# Patient Record
Sex: Female | Born: 1945
Health system: Southern US, Community
[De-identification: ages and names within clinical notes are randomized; demographics above are authoritative.]

## PROBLEM LIST (undated history)

## (undated) DIAGNOSIS — E785 Hyperlipidemia, unspecified: Secondary | ICD-10-CM

## (undated) DIAGNOSIS — Z9289 Personal history of other medical treatment: Secondary | ICD-10-CM

## (undated) DIAGNOSIS — E118 Type 2 diabetes mellitus with unspecified complications: Secondary | ICD-10-CM

## (undated) DIAGNOSIS — I739 Peripheral vascular disease, unspecified: Secondary | ICD-10-CM

## (undated) DIAGNOSIS — I10 Essential (primary) hypertension: Secondary | ICD-10-CM

## (undated) DIAGNOSIS — I2510 Atherosclerotic heart disease of native coronary artery without angina pectoris: Secondary | ICD-10-CM

## (undated) DIAGNOSIS — I779 Disorder of arteries and arterioles, unspecified: Secondary | ICD-10-CM

## (undated) DIAGNOSIS — E119 Type 2 diabetes mellitus without complications: Secondary | ICD-10-CM

## (undated) DIAGNOSIS — I771 Stricture of artery: Secondary | ICD-10-CM

## (undated) DIAGNOSIS — I774 Celiac artery compression syndrome: Secondary | ICD-10-CM

## (undated) HISTORY — DX: Celiac artery compression syndrome: I77.4

## (undated) HISTORY — DX: Essential (primary) hypertension: I10

## (undated) HISTORY — DX: Peripheral vascular disease, unspecified: I73.9

## (undated) HISTORY — PX: CORONARY ANGIOPLASTY: SHX604

## (undated) HISTORY — DX: Personal history of other medical treatment: Z92.89

## (undated) HISTORY — DX: Disorder of arteries and arterioles, unspecified: I77.9

## (undated) HISTORY — DX: Stricture of artery: I77.1

## (undated) HISTORY — DX: Atherosclerotic heart disease of native coronary artery without angina pectoris: I25.10

## (undated) HISTORY — PX: EYE SURGERY: SHX253

## (undated) HISTORY — DX: Hyperlipidemia, unspecified: E78.5

## (undated) HISTORY — PX: ANKLE SURGERY: SHX546

---

## 2004-05-14 ENCOUNTER — Ambulatory Visit: Payer: Self-pay | Admitting: Family Medicine

## 2005-05-19 ENCOUNTER — Ambulatory Visit: Payer: Self-pay | Admitting: Family Medicine

## 2005-08-24 ENCOUNTER — Ambulatory Visit: Payer: Self-pay | Admitting: Family Medicine

## 2005-10-23 ENCOUNTER — Emergency Department: Payer: Self-pay | Admitting: Internal Medicine

## 2006-01-03 ENCOUNTER — Emergency Department: Payer: Self-pay | Admitting: Emergency Medicine

## 2006-07-15 ENCOUNTER — Ambulatory Visit: Payer: Self-pay | Admitting: Family Medicine

## 2006-08-18 ENCOUNTER — Ambulatory Visit: Payer: Self-pay | Admitting: Specialist

## 2006-08-18 ENCOUNTER — Other Ambulatory Visit: Payer: Self-pay

## 2006-08-25 ENCOUNTER — Ambulatory Visit: Payer: Self-pay | Admitting: Specialist

## 2006-12-20 ENCOUNTER — Ambulatory Visit: Payer: Self-pay | Admitting: Physician Assistant

## 2008-02-01 ENCOUNTER — Ambulatory Visit: Payer: Self-pay | Admitting: Family Medicine

## 2012-02-09 ENCOUNTER — Ambulatory Visit: Payer: Self-pay | Admitting: Family Medicine

## 2012-02-23 ENCOUNTER — Ambulatory Visit: Payer: Self-pay | Admitting: Family Medicine

## 2015-09-30 ENCOUNTER — Inpatient Hospital Stay (HOSPITAL_COMMUNITY): Payer: Commercial Managed Care - HMO

## 2015-09-30 ENCOUNTER — Emergency Department
Admission: EM | Admit: 2015-09-30 | Discharge: 2015-09-30 | Disposition: A | Discharge status: Other Acute Inpatient Hospital | Payer: Commercial Managed Care - HMO | Attending: Emergency Medicine | Admitting: Emergency Medicine

## 2015-09-30 ENCOUNTER — Inpatient Hospital Stay (HOSPITAL_COMMUNITY): Payer: Commercial Managed Care - HMO | Admitting: Certified Registered"

## 2015-09-30 ENCOUNTER — Emergency Department: Payer: Commercial Managed Care - HMO

## 2015-09-30 ENCOUNTER — Other Ambulatory Visit (HOSPITAL_COMMUNITY): Payer: Self-pay

## 2015-09-30 ENCOUNTER — Inpatient Hospital Stay (HOSPITAL_COMMUNITY): Payer: Self-pay

## 2015-09-30 ENCOUNTER — Inpatient Hospital Stay (HOSPITAL_COMMUNITY)
Admission: AD | Admit: 2015-09-30 | Discharge: 2015-10-05 | DRG: 234 | Disposition: A | Discharge status: Home / Self Care | Payer: Commercial Managed Care - HMO | Source: Ambulatory Visit | Attending: Surgery | Admitting: Surgery

## 2015-09-30 ENCOUNTER — Encounter (HOSPITAL_COMMUNITY)
Admission: AD | Disposition: A | Discharge status: Home / Self Care | Payer: Self-pay | Source: Ambulatory Visit | Attending: Surgery

## 2015-09-30 ENCOUNTER — Encounter (HOSPITAL_COMMUNITY): Payer: Self-pay | Admitting: Internal Medicine

## 2015-09-30 DIAGNOSIS — I2510 Atherosclerotic heart disease of native coronary artery without angina pectoris: Secondary | ICD-10-CM | POA: Diagnosis not present

## 2015-09-30 DIAGNOSIS — I493 Ventricular premature depolarization: Secondary | ICD-10-CM | POA: Diagnosis not present

## 2015-09-30 DIAGNOSIS — Z8249 Family history of ischemic heart disease and other diseases of the circulatory system: Secondary | ICD-10-CM | POA: Diagnosis not present

## 2015-09-30 DIAGNOSIS — I252 Old myocardial infarction: Secondary | ICD-10-CM

## 2015-09-30 DIAGNOSIS — F17210 Nicotine dependence, cigarettes, uncomplicated: Secondary | ICD-10-CM | POA: Diagnosis present

## 2015-09-30 DIAGNOSIS — E8770 Fluid overload, unspecified: Secondary | ICD-10-CM | POA: Diagnosis not present

## 2015-09-30 DIAGNOSIS — Z7982 Long term (current) use of aspirin: Secondary | ICD-10-CM | POA: Diagnosis not present

## 2015-09-30 DIAGNOSIS — I213 ST elevation (STEMI) myocardial infarction of unspecified site: Secondary | ICD-10-CM | POA: Diagnosis not present

## 2015-09-30 DIAGNOSIS — Z955 Presence of coronary angioplasty implant and graft: Secondary | ICD-10-CM | POA: Diagnosis not present

## 2015-09-30 DIAGNOSIS — I472 Ventricular tachycardia: Secondary | ICD-10-CM | POA: Diagnosis not present

## 2015-09-30 DIAGNOSIS — E119 Type 2 diabetes mellitus without complications: Secondary | ICD-10-CM | POA: Diagnosis not present

## 2015-09-30 DIAGNOSIS — I9789 Other postprocedural complications and disorders of the circulatory system, not elsewhere classified: Secondary | ICD-10-CM | POA: Diagnosis not present

## 2015-09-30 DIAGNOSIS — E1169 Type 2 diabetes mellitus with other specified complication: Secondary | ICD-10-CM

## 2015-09-30 DIAGNOSIS — I209 Angina pectoris, unspecified: Secondary | ICD-10-CM

## 2015-09-30 DIAGNOSIS — R7303 Prediabetes: Secondary | ICD-10-CM | POA: Diagnosis present

## 2015-09-30 DIAGNOSIS — E785 Hyperlipidemia, unspecified: Secondary | ICD-10-CM | POA: Diagnosis present

## 2015-09-30 DIAGNOSIS — I4891 Unspecified atrial fibrillation: Secondary | ICD-10-CM | POA: Diagnosis not present

## 2015-09-30 DIAGNOSIS — Z79899 Other long term (current) drug therapy: Secondary | ICD-10-CM

## 2015-09-30 DIAGNOSIS — I10 Essential (primary) hypertension: Secondary | ICD-10-CM | POA: Diagnosis present

## 2015-09-30 DIAGNOSIS — I25110 Atherosclerotic heart disease of native coronary artery with unstable angina pectoris: Secondary | ICD-10-CM

## 2015-09-30 DIAGNOSIS — Z720 Tobacco use: Secondary | ICD-10-CM

## 2015-09-30 DIAGNOSIS — E118 Type 2 diabetes mellitus with unspecified complications: Secondary | ICD-10-CM | POA: Diagnosis present

## 2015-09-30 DIAGNOSIS — J9811 Atelectasis: Secondary | ICD-10-CM | POA: Diagnosis not present

## 2015-09-30 DIAGNOSIS — Z4682 Encounter for fitting and adjustment of non-vascular catheter: Secondary | ICD-10-CM | POA: Diagnosis not present

## 2015-09-30 DIAGNOSIS — D62 Acute posthemorrhagic anemia: Secondary | ICD-10-CM | POA: Diagnosis not present

## 2015-09-30 DIAGNOSIS — J439 Emphysema, unspecified: Secondary | ICD-10-CM | POA: Diagnosis not present

## 2015-09-30 DIAGNOSIS — R079 Chest pain, unspecified: Secondary | ICD-10-CM | POA: Diagnosis not present

## 2015-09-30 DIAGNOSIS — Z09 Encounter for follow-up examination after completed treatment for conditions other than malignant neoplasm: Secondary | ICD-10-CM

## 2015-09-30 DIAGNOSIS — R0789 Other chest pain: Secondary | ICD-10-CM | POA: Diagnosis present

## 2015-09-30 DIAGNOSIS — I2102 ST elevation (STEMI) myocardial infarction involving left anterior descending coronary artery: Principal | ICD-10-CM | POA: Diagnosis present

## 2015-09-30 DIAGNOSIS — Z951 Presence of aortocoronary bypass graft: Secondary | ICD-10-CM

## 2015-09-30 DIAGNOSIS — F17200 Nicotine dependence, unspecified, uncomplicated: Secondary | ICD-10-CM | POA: Diagnosis not present

## 2015-09-30 HISTORY — DX: Type 2 diabetes mellitus without complications: E11.9

## 2015-09-30 HISTORY — DX: Tobacco use: Z72.0

## 2015-09-30 HISTORY — PX: CORONARY ARTERY BYPASS GRAFT: SHX141

## 2015-09-30 HISTORY — PX: CARDIAC CATHETERIZATION: SHX172

## 2015-09-30 LAB — POCT I-STAT, CHEM 8
BUN: 13 mg/dL (ref 6–20)
BUN: 11 mg/dL (ref 6–20)
BUN: 8 mg/dL (ref 6–20)
BUN: 9 mg/dL (ref 6–20)
BUN: 9 mg/dL (ref 6–20)
BUN: 6 mg/dL (ref 6–20)
CALCIUM ION: 1.18 mmol/L (ref 1.13–1.30)
CALCIUM ION: 0.99 mmol/L — AB (ref 1.13–1.30)
CALCIUM ION: 1.02 mmol/L — AB (ref 1.13–1.30)
CALCIUM ION: 1.18 mmol/L (ref 1.13–1.30)
CHLORIDE: 100 mmol/L — AB (ref 101–111)
CHLORIDE: 100 mmol/L — AB (ref 101–111)
CHLORIDE: 105 mmol/L (ref 101–111)
CHLORIDE: 103 mmol/L (ref 101–111)
CHLORIDE: 110 mmol/L (ref 101–111)
CREATININE: 0.6 mg/dL (ref 0.44–1.00)
CREATININE: 0.6 mg/dL (ref 0.44–1.00)
CREATININE: 0.7 mg/dL (ref 0.44–1.00)
Calcium, Ion: 1.04 mmol/L — ABNORMAL LOW (ref 1.13–1.30)
Calcium, Ion: 1.15 mmol/L (ref 1.13–1.30)
Chloride: 106 mmol/L (ref 101–111)
Creatinine, Ser: 0.6 mg/dL (ref 0.44–1.00)
Creatinine, Ser: 0.6 mg/dL (ref 0.44–1.00)
Creatinine, Ser: 0.6 mg/dL (ref 0.44–1.00)
GLUCOSE: 119 mg/dL — AB (ref 65–99)
GLUCOSE: 125 mg/dL — AB (ref 65–99)
GLUCOSE: 138 mg/dL — AB (ref 65–99)
GLUCOSE: 132 mg/dL — AB (ref 65–99)
GLUCOSE: 98 mg/dL (ref 65–99)
Glucose, Bld: 135 mg/dL — ABNORMAL HIGH (ref 65–99)
HCT: 24 % — ABNORMAL LOW (ref 36.0–46.0)
HCT: 24 % — ABNORMAL LOW (ref 36.0–46.0)
HCT: 32 % — ABNORMAL LOW (ref 36.0–46.0)
HCT: 25 % — ABNORMAL LOW (ref 36.0–46.0)
HCT: 30 % — ABNORMAL LOW (ref 36.0–46.0)
HEMATOCRIT: 37 % (ref 36.0–46.0)
HEMOGLOBIN: 10.9 g/dL — AB (ref 12.0–15.0)
Hemoglobin: 12.6 g/dL (ref 12.0–15.0)
Hemoglobin: 8.2 g/dL — ABNORMAL LOW (ref 12.0–15.0)
Hemoglobin: 8.2 g/dL — ABNORMAL LOW (ref 12.0–15.0)
Hemoglobin: 8.5 g/dL — ABNORMAL LOW (ref 12.0–15.0)
Hemoglobin: 10.2 g/dL — ABNORMAL LOW (ref 12.0–15.0)
POTASSIUM: 4.7 mmol/L (ref 3.5–5.1)
POTASSIUM: 5.6 mmol/L — AB (ref 3.5–5.1)
Potassium: 5.1 mmol/L (ref 3.5–5.1)
Potassium: 5.6 mmol/L — ABNORMAL HIGH (ref 3.5–5.1)
Potassium: 5.3 mmol/L — ABNORMAL HIGH (ref 3.5–5.1)
Potassium: 4.1 mmol/L (ref 3.5–5.1)
SODIUM: 140 mmol/L (ref 135–145)
Sodium: 137 mmol/L (ref 135–145)
Sodium: 135 mmol/L (ref 135–145)
Sodium: 135 mmol/L (ref 135–145)
Sodium: 137 mmol/L (ref 135–145)
Sodium: 134 mmol/L — ABNORMAL LOW (ref 135–145)
TCO2: 29 mmol/L (ref 0–100)
TCO2: 25 mmol/L (ref 0–100)
TCO2: 26 mmol/L (ref 0–100)
TCO2: 29 mmol/L (ref 0–100)
TCO2: 25 mmol/L (ref 0–100)
TCO2: 25 mmol/L (ref 0–100)

## 2015-09-30 LAB — POCT I-STAT 3, ART BLOOD GAS (G3+)
ACID-BASE DEFICIT: 3 mmol/L — AB (ref 0.0–2.0)
ACID-BASE DEFICIT: 3 mmol/L — AB (ref 0.0–2.0)
ACID-BASE DEFICIT: 2 mmol/L (ref 0.0–2.0)
ACID-BASE DEFICIT: 4 mmol/L — AB (ref 0.0–2.0)
ACID-BASE DEFICIT: 3 mmol/L — AB (ref 0.0–2.0)
ACID-BASE DEFICIT: 5 mmol/L — AB (ref 0.0–2.0)
Acid-Base Excess: 1 mmol/L (ref 0.0–2.0)
BICARBONATE: 23.1 meq/L (ref 20.0–24.0)
BICARBONATE: 25.1 meq/L — AB (ref 20.0–24.0)
BICARBONATE: 22 meq/L (ref 20.0–24.0)
BICARBONATE: 22.9 meq/L (ref 20.0–24.0)
BICARBONATE: 24.2 meq/L — AB (ref 20.0–24.0)
BICARBONATE: 23.3 meq/L (ref 20.0–24.0)
BICARBONATE: 25.4 meq/L — AB (ref 20.0–24.0)
BICARBONATE: 21.6 meq/L (ref 20.0–24.0)
O2 SAT: 96 %
O2 SAT: 98 %
O2 SAT: 98 %
O2 SAT: 97 %
O2 Saturation: 100 %
O2 Saturation: 98 %
O2 Saturation: 98 %
O2 Saturation: 96 %
PCO2 ART: 35.2 mmHg (ref 35.0–45.0)
PCO2 ART: 51.6 mmHg — AB (ref 35.0–45.0)
PCO2 ART: 46.4 mmHg — AB (ref 35.0–45.0)
PH ART: 7.462 — AB (ref 7.350–7.450)
PH ART: 7.362 (ref 7.350–7.450)
PH ART: 7.346 — AB (ref 7.350–7.450)
PO2 ART: 88 mmHg (ref 80.0–100.0)
PO2 ART: 106 mmHg — AB (ref 80.0–100.0)
PO2 ART: 113 mmHg — AB (ref 80.0–100.0)
PO2 ART: 123 mmHg — AB (ref 80.0–100.0)
PO2 ART: 111 mmHg — AB (ref 80.0–100.0)
PO2 ART: 87 mmHg (ref 80.0–100.0)
Patient temperature: 35.9
Patient temperature: 36.7
Patient temperature: 37
Patient temperature: 37.2
TCO2: 24 mmol/L (ref 0–100)
TCO2: 26 mmol/L (ref 0–100)
TCO2: 23 mmol/L (ref 0–100)
TCO2: 24 mmol/L (ref 0–100)
TCO2: 26 mmol/L (ref 0–100)
TCO2: 25 mmol/L (ref 0–100)
TCO2: 27 mmol/L (ref 0–100)
TCO2: 23 mmol/L (ref 0–100)
pCO2 arterial: 43.6 mmHg (ref 35.0–45.0)
pCO2 arterial: 38.3 mmHg (ref 35.0–45.0)
pCO2 arterial: 47 mmHg — ABNORMAL HIGH (ref 35.0–45.0)
pCO2 arterial: 47.6 mmHg — ABNORMAL HIGH (ref 35.0–45.0)
pCO2 arterial: 46.8 mmHg — ABNORMAL HIGH (ref 35.0–45.0)
pH, Arterial: 7.332 — ABNORMAL LOW (ref 7.350–7.450)
pH, Arterial: 7.254 — ABNORMAL LOW (ref 7.350–7.450)
pH, Arterial: 7.318 — ABNORMAL LOW (ref 7.350–7.450)
pH, Arterial: 7.298 — ABNORMAL LOW (ref 7.350–7.450)
pH, Arterial: 7.274 — ABNORMAL LOW (ref 7.350–7.450)
pO2, Arterial: 309 mmHg — ABNORMAL HIGH (ref 80.0–100.0)
pO2, Arterial: 99 mmHg (ref 80.0–100.0)

## 2015-09-30 LAB — COMPREHENSIVE METABOLIC PANEL
ALBUMIN: 4.4 g/dL (ref 3.5–5.0)
ALBUMIN: 2.9 g/dL — AB (ref 3.5–5.0)
ALK PHOS: 93 U/L (ref 38–126)
ALT: 14 U/L (ref 14–54)
ALT: 12 U/L — ABNORMAL LOW (ref 14–54)
ANION GAP: 9 (ref 5–15)
ANION GAP: 6 (ref 5–15)
AST: 18 U/L (ref 15–41)
AST: 18 U/L (ref 15–41)
Alkaline Phosphatase: 125 U/L (ref 38–126)
BILIRUBIN TOTAL: 0.3 mg/dL (ref 0.3–1.2)
BILIRUBIN TOTAL: 0.4 mg/dL (ref 0.3–1.2)
BUN: 14 mg/dL (ref 6–20)
BUN: 11 mg/dL (ref 6–20)
CALCIUM: 9.1 mg/dL (ref 8.9–10.3)
CO2: 24 mmol/L (ref 22–32)
CO2: 23 mmol/L (ref 22–32)
Calcium: 8.1 mg/dL — ABNORMAL LOW (ref 8.9–10.3)
Chloride: 103 mmol/L (ref 101–111)
Chloride: 100 mmol/L — ABNORMAL LOW (ref 101–111)
Creatinine, Ser: 1.19 mg/dL — ABNORMAL HIGH (ref 0.44–1.00)
Creatinine, Ser: 0.95 mg/dL (ref 0.44–1.00)
GFR calc Af Amer: >60 mL/min (ref >60)
GFR calc non Af Amer: 46 mL/min — ABNORMAL LOW (ref >60)
GFR, EST AFRICAN AMERICAN: 53 mL/min — AB (ref >60)
GLUCOSE: 167 mg/dL — AB (ref 65–99)
GLUCOSE: 167 mg/dL — AB (ref 65–99)
POTASSIUM: 3.8 mmol/L (ref 3.5–5.1)
Potassium: 3.9 mmol/L (ref 3.5–5.1)
SODIUM: 136 mmol/L (ref 135–145)
Sodium: 129 mmol/L — ABNORMAL LOW (ref 135–145)
TOTAL PROTEIN: 8 g/dL (ref 6.5–8.1)
TOTAL PROTEIN: 5.7 g/dL — AB (ref 6.5–8.1)

## 2015-09-30 LAB — SURGICAL PCR SCREEN
MRSA, PCR: POSITIVE — AB
Staphylococcus aureus: POSITIVE — AB

## 2015-09-30 LAB — GLUCOSE, CAPILLARY
GLUCOSE-CAPILLARY: 112 mg/dL — AB (ref 65–99)
GLUCOSE-CAPILLARY: 111 mg/dL — AB (ref 65–99)
Glucose-Capillary: 142 mg/dL — ABNORMAL HIGH (ref 65–99)
Glucose-Capillary: 95 mg/dL (ref 65–99)
Glucose-Capillary: 105 mg/dL — ABNORMAL HIGH (ref 65–99)
Glucose-Capillary: 104 mg/dL — ABNORMAL HIGH (ref 65–99)
Glucose-Capillary: 120 mg/dL — ABNORMAL HIGH (ref 65–99)
Glucose-Capillary: 121 mg/dL — ABNORMAL HIGH (ref 65–99)

## 2015-09-30 LAB — HEMOGLOBIN AND HEMATOCRIT, BLOOD
HCT: 22.9 % — ABNORMAL LOW (ref 36.0–46.0)
HEMOGLOBIN: 7.4 g/dL — AB (ref 12.0–15.0)

## 2015-09-30 LAB — CBC WITH DIFFERENTIAL/PLATELET
BASOS ABS: 0.1 10*3/uL (ref 0–0.1)
Basophils Relative: 1 %
EOS ABS: 0.4 10*3/uL (ref 0–0.7)
EOS PCT: 4 %
HCT: 39.3 % (ref 35.0–47.0)
HEMOGLOBIN: 13.3 g/dL (ref 12.0–16.0)
LYMPHS ABS: 2.7 10*3/uL (ref 1.0–3.6)
LYMPHS PCT: 28 %
MCH: 29.8 pg (ref 26.0–34.0)
MCHC: 33.8 g/dL (ref 32.0–36.0)
MCV: 88.3 fL (ref 80.0–100.0)
Monocytes Absolute: 0.7 10*3/uL (ref 0.2–0.9)
Monocytes Relative: 7 %
NEUTROS PCT: 60 %
Neutro Abs: 5.8 10*3/uL (ref 1.4–6.5)
Platelets: 338 10*3/uL (ref 150–440)
RBC: 4.44 MIL/uL (ref 3.80–5.20)
RDW: 15.1 % — AB (ref 11.5–14.5)
WBC: 9.6 10*3/uL (ref 3.6–11.0)

## 2015-09-30 LAB — PLATELET COUNT: PLATELETS: 200 10*3/uL (ref 150–400)

## 2015-09-30 LAB — CBC
HCT: 34.1 % — ABNORMAL LOW (ref 36.0–46.0)
HCT: 29.9 % — ABNORMAL LOW (ref 36.0–46.0)
HEMATOCRIT: 27.8 % — AB (ref 36.0–46.0)
HEMOGLOBIN: 10.7 g/dL — AB (ref 12.0–15.0)
Hemoglobin: 9.5 g/dL — ABNORMAL LOW (ref 12.0–15.0)
Hemoglobin: 8.9 g/dL — ABNORMAL LOW (ref 12.0–15.0)
MCH: 28 pg (ref 26.0–34.0)
MCH: 28.4 pg (ref 26.0–34.0)
MCH: 28.9 pg (ref 26.0–34.0)
MCHC: 31.4 g/dL (ref 30.0–36.0)
MCHC: 31.8 g/dL (ref 30.0–36.0)
MCHC: 32 g/dL (ref 30.0–36.0)
MCV: 89.3 fL (ref 78.0–100.0)
MCV: 89.3 fL (ref 78.0–100.0)
MCV: 90.3 fL (ref 78.0–100.0)
PLATELETS: 207 10*3/uL (ref 150–400)
Platelets: 290 10*3/uL (ref 150–400)
Platelets: 221 10*3/uL (ref 150–400)
RBC: 3.82 MIL/uL — ABNORMAL LOW (ref 3.87–5.11)
RBC: 3.35 MIL/uL — AB (ref 3.87–5.11)
RBC: 3.08 MIL/uL — ABNORMAL LOW (ref 3.87–5.11)
RDW: 14.8 % (ref 11.5–15.5)
RDW: 14.8 % (ref 11.5–15.5)
RDW: 15.1 % (ref 11.5–15.5)
WBC: 6.8 10*3/uL (ref 4.0–10.5)
WBC: 14.1 10*3/uL — ABNORMAL HIGH (ref 4.0–10.5)
WBC: 16.5 10*3/uL — AB (ref 4.0–10.5)

## 2015-09-30 LAB — PROTIME-INR
INR: 1.07
INR: 1.43 (ref 0.00–1.49)
PROTHROMBIN TIME: 14.1 s (ref 11.4–15.0)
PROTHROMBIN TIME: 17.6 s — AB (ref 11.6–15.2)
Prothrombin Time: 82.8 seconds — ABNORMAL HIGH (ref 11.6–15.2)

## 2015-09-30 LAB — TSH: TSH: 0.92 u[IU]/mL (ref 0.350–4.500)

## 2015-09-30 LAB — CREATININE, SERUM
CREATININE: 0.78 mg/dL (ref 0.44–1.00)
GFR calc Af Amer: >60 mL/min (ref >60)

## 2015-09-30 LAB — POCT I-STAT 4, (NA,K, GLUC, HGB,HCT)
Glucose, Bld: 143 mg/dL — ABNORMAL HIGH (ref 65–99)
HCT: 30 % — ABNORMAL LOW (ref 36.0–46.0)
HEMOGLOBIN: 10.2 g/dL — AB (ref 12.0–15.0)
Potassium: 4.1 mmol/L (ref 3.5–5.1)
SODIUM: 139 mmol/L (ref 135–145)

## 2015-09-30 LAB — LIPID PANEL
CHOL/HDL RATIO: 4.5 ratio
CHOLESTEROL: 150 mg/dL (ref 0–200)
HDL: 33 mg/dL — ABNORMAL LOW (ref >40)
LDL Cholesterol: 104 mg/dL — ABNORMAL HIGH (ref 0–99)
Triglycerides: 63 mg/dL (ref <150)
VLDL: 13 mg/dL (ref 0–40)

## 2015-09-30 LAB — TROPONIN I
TROPONIN I: 50.8 ng/mL — AB (ref <0.031)
Troponin I: 0.55 ng/mL — ABNORMAL HIGH (ref <0.031)
Troponin I: 1.4 ng/mL (ref <0.031)
Troponin I: 41.9 ng/mL (ref <0.031)

## 2015-09-30 LAB — PREPARE RBC (CROSSMATCH)

## 2015-09-30 LAB — CK TOTAL AND CKMB (NOT AT ARMC)
CK TOTAL: 192 U/L (ref 38–234)
CK, MB: 20.9 ng/mL — ABNORMAL HIGH (ref 0.5–5.0)
Relative Index: 10.9 — ABNORMAL HIGH (ref 0.0–2.5)

## 2015-09-30 LAB — APTT
APTT: 37 s — AB (ref 24–36)
aPTT: >200 seconds (ref 24–37)
aPTT: 46 seconds — ABNORMAL HIGH (ref 24–37)

## 2015-09-30 LAB — POCT ACTIVATED CLOTTING TIME: Activated Clotting Time: 428 seconds

## 2015-09-30 LAB — MAGNESIUM: MAGNESIUM: 3.3 mg/dL — AB (ref 1.7–2.4)

## 2015-09-30 LAB — ABO/RH: ABO/RH(D): A POS

## 2015-09-30 SURGERY — CORONARY ARTERY BYPASS GRAFTING (CABG)
Anesthesia: General | Site: Chest

## 2015-09-30 SURGERY — LEFT HEART CATH AND CORONARY ANGIOGRAPHY
Anesthesia: LOCAL

## 2015-09-30 MED ORDER — METOPROLOL TARTRATE 12.5 MG HALF TABLET: 12.5000 mg | ORAL_TABLET | Freq: Two times a day (BID) | ORAL | Status: DC

## 2015-09-30 MED ORDER — LEVOFLOXACIN IN D5W 750 MG/150ML IV SOLN
750.0000 mg | INTRAVENOUS | Status: AC
  Administered 2015-10-01: 750 mg via INTRAVENOUS
  Filled 2015-09-30: qty 150

## 2015-09-30 MED ORDER — ASPIRIN EC 325 MG PO TBEC
325.0000 mg | DELAYED_RELEASE_TABLET | Freq: Every day | ORAL | Status: DC
  Administered 2015-10-01 – 2015-10-03 (×3): 325 mg via ORAL
  Filled 2015-09-30 (×4): qty 1

## 2015-09-30 MED ORDER — MAGNESIUM SULFATE 50 % IJ SOLN
40.0000 meq | INTRAMUSCULAR | Status: DC
  Filled 2015-09-30: qty 10

## 2015-09-30 MED ORDER — CHLORHEXIDINE GLUCONATE CLOTH 2 % EX PADS
6.0000 | MEDICATED_PAD | Freq: Every day | CUTANEOUS | Status: AC
  Administered 2015-09-30 – 2015-10-03 (×4): 6 via TOPICAL

## 2015-09-30 MED ORDER — ACETAMINOPHEN 500 MG PO TABS
1000.0000 mg | ORAL_TABLET | Freq: Four times a day (QID) | ORAL | Status: DC
  Administered 2015-10-01 – 2015-10-03 (×10): 1000 mg via ORAL
  Filled 2015-09-30 (×13): qty 2

## 2015-09-30 MED ORDER — HEMOSTATIC AGENTS (NO CHARGE) OPTIME
TOPICAL | Status: DC | PRN
  Administered 2015-09-30: 1 via TOPICAL

## 2015-09-30 MED ORDER — ONDANSETRON HCL 4 MG/2ML IJ SOLN: 4.0000 mg | Freq: Four times a day (QID) | INTRAMUSCULAR | Status: DC | PRN

## 2015-09-30 MED ORDER — METOPROLOL TARTRATE 5 MG/5ML IV SOLN
2.5000 mg | INTRAVENOUS | Status: DC | PRN
  Filled 2015-09-30: qty 5

## 2015-09-30 MED ORDER — SODIUM CHLORIDE 0.9% FLUSH
3.0000 mL | INTRAVENOUS | Status: DC | PRN
  Administered 2015-10-01: 10 mL via INTRAVENOUS
  Administered 2015-10-01: 20 mL via INTRAVENOUS
  Administered 2015-10-01: 3 mL via INTRAVENOUS
  Filled 2015-09-30 (×3): qty 3

## 2015-09-30 MED ORDER — NITROGLYCERIN 0.2 MG/ML ON CALL CATH LAB
INTRAVENOUS | Status: DC | PRN
  Administered 2015-09-30 (×4): 40 ug via INTRAVENOUS

## 2015-09-30 MED ORDER — NITROGLYCERIN 0.4 MG SL SUBL
0.4000 mg | SUBLINGUAL_TABLET | SUBLINGUAL | Status: DC | PRN
  Administered 2015-09-30: 0.4 mg via SUBLINGUAL

## 2015-09-30 MED ORDER — FAMOTIDINE IN NACL 20-0.9 MG/50ML-% IV SOLN
20.0000 mg | Freq: Two times a day (BID) | INTRAVENOUS | Status: AC
  Administered 2015-09-30: 20 mg via INTRAVENOUS

## 2015-09-30 MED ORDER — PHENYLEPHRINE HCL 10 MG/ML IJ SOLN
30.0000 ug/min | INTRAMUSCULAR | Status: DC
  Filled 2015-09-30: qty 2

## 2015-09-30 MED ORDER — PROTAMINE SULFATE 10 MG/ML IV SOLN
INTRAVENOUS | Status: DC | PRN
  Administered 2015-09-30: 10 mg via INTRAVENOUS
  Administered 2015-09-30: 50 mg via INTRAVENOUS
  Administered 2015-09-30: 80 mg via INTRAVENOUS
  Administered 2015-09-30: 50 mg via INTRAVENOUS

## 2015-09-30 MED ORDER — BIVALIRUDIN BOLUS VIA INFUSION - CUPID
INTRAVENOUS | Status: DC | PRN
  Administered 2015-09-30: 61.05 mg via INTRAVENOUS

## 2015-09-30 MED ORDER — MORPHINE SULFATE (PF) 2 MG/ML IV SOLN
2.0000 mg | Freq: Once | INTRAVENOUS | Status: AC
  Administered 2015-09-30: 2 mg via INTRAVENOUS

## 2015-09-30 MED ORDER — CHLORHEXIDINE GLUCONATE 0.12% ORAL RINSE (MEDLINE KIT)
15.0000 mL | Freq: Two times a day (BID) | OROMUCOSAL | Status: DC
  Administered 2015-09-30 – 2015-10-03 (×4): 15 mL via OROMUCOSAL

## 2015-09-30 MED ORDER — NITROGLYCERIN IN D5W 200-5 MCG/ML-% IV SOLN
INTRAVENOUS | Status: AC
  Filled 2015-09-30: qty 250

## 2015-09-30 MED ORDER — LACTATED RINGERS IV SOLN
INTRAVENOUS | Status: DC
  Administered 2015-09-30: 14:00:00 via INTRAVENOUS

## 2015-09-30 MED ORDER — ACETAMINOPHEN 160 MG/5ML PO SOLN: 1000.0000 mg | Freq: Four times a day (QID) | ORAL | Status: DC

## 2015-09-30 MED ORDER — SODIUM CHLORIDE 0.9 % IV SOLN: 250.0000 mL | INTRAVENOUS | Status: DC | PRN

## 2015-09-30 MED ORDER — PHENYLEPHRINE HCL 10 MG/ML IJ SOLN
0.0000 ug/min | INTRAVENOUS | Status: DC
  Filled 2015-09-30: qty 1

## 2015-09-30 MED ORDER — ASPIRIN 81 MG PO CHEW: 324.0000 mg | CHEWABLE_TABLET | ORAL | Status: DC

## 2015-09-30 MED ORDER — PANTOPRAZOLE SODIUM 40 MG PO TBEC
40.0000 mg | DELAYED_RELEASE_TABLET | Freq: Every day | ORAL | Status: DC
  Administered 2015-10-02 – 2015-10-03 (×2): 40 mg via ORAL
  Filled 2015-09-30 (×2): qty 1

## 2015-09-30 MED ORDER — PHENYLEPHRINE HCL 10 MG/ML IJ SOLN
10.0000 mg | INTRAVENOUS | Status: DC | PRN
  Administered 2015-09-30: 25 ug/min via INTRAVENOUS
  Administered 2015-09-30: 10 ug/min via INTRAVENOUS

## 2015-09-30 MED ORDER — SODIUM CHLORIDE 0.9 % IV SOLN
1250.0000 mg | INTRAVENOUS | Status: DC | PRN
  Administered 2015-09-30: 1250 mg via INTRAVENOUS

## 2015-09-30 MED ORDER — FENTANYL CITRATE (PF) 250 MCG/5ML IJ SOLN
INTRAMUSCULAR | Status: AC
  Filled 2015-09-30: qty 25

## 2015-09-30 MED ORDER — NITROGLYCERIN IN D5W 200-5 MCG/ML-% IV SOLN: 0.0000 ug/min | INTRAVENOUS | Status: DC

## 2015-09-30 MED ORDER — ROCURONIUM BROMIDE 10 MG/ML (PF) SYRINGE
PREFILLED_SYRINGE | INTRAVENOUS | Status: DC | PRN
  Administered 2015-09-30: 50 mg via INTRAVENOUS
  Administered 2015-09-30: 30 mg via INTRAVENOUS
  Administered 2015-09-30: 50 mg via INTRAVENOUS

## 2015-09-30 MED ORDER — HEPARIN SODIUM (PORCINE) 1000 UNIT/ML IJ SOLN
INTRAMUSCULAR | Status: AC
  Filled 2015-09-30: qty 1

## 2015-09-30 MED ORDER — NITROGLYCERIN 0.4 MG SL SUBL: 0.4000 mg | SUBLINGUAL_TABLET | SUBLINGUAL | Status: DC | PRN

## 2015-09-30 MED ORDER — IOPAMIDOL (ISOVUE-370) INJECTION 76%
INTRAVENOUS | Status: AC
  Filled 2015-09-30: qty 125

## 2015-09-30 MED ORDER — MIDAZOLAM HCL 2 MG/2ML IJ SOLN
INTRAMUSCULAR | Status: AC
  Filled 2015-09-30: qty 2

## 2015-09-30 MED ORDER — SODIUM CHLORIDE 0.9 % IV SOLN
INTRAVENOUS | Status: DC
  Filled 2015-09-30: qty 2.5

## 2015-09-30 MED ORDER — ACETAMINOPHEN 160 MG/5ML PO SOLN: 650.0000 mg | Freq: Once | ORAL | Status: AC

## 2015-09-30 MED ORDER — MIDAZOLAM HCL 5 MG/5ML IJ SOLN
INTRAMUSCULAR | Status: DC | PRN
  Administered 2015-09-30: 2 mg via INTRAVENOUS
  Administered 2015-09-30 (×3): 1 mg via INTRAVENOUS
  Administered 2015-09-30: 2 mg via INTRAVENOUS
  Administered 2015-09-30: 3 mg via INTRAVENOUS
  Administered 2015-09-30: 2 mg via INTRAVENOUS

## 2015-09-30 MED ORDER — LACTATED RINGERS IV SOLN
INTRAVENOUS | Status: DC | PRN
  Administered 2015-09-30: 07:00:00 via INTRAVENOUS

## 2015-09-30 MED ORDER — THROMBIN 20000 UNITS EX SOLR
CUTANEOUS | Status: AC
Start: 2015-09-30 — End: 2015-09-30
  Filled 2015-09-30: qty 20000

## 2015-09-30 MED ORDER — VERAPAMIL HCL 2.5 MG/ML IV SOLN
INTRAVENOUS | Status: AC
  Filled 2015-09-30: qty 2

## 2015-09-30 MED ORDER — ONDANSETRON HCL 4 MG/2ML IJ SOLN
INTRAMUSCULAR | Status: AC
  Administered 2015-09-30: 4 mg via INTRAVENOUS
  Filled 2015-09-30: qty 2

## 2015-09-30 MED ORDER — SUCCINYLCHOLINE 20MG/ML (10ML) SYRINGE FOR MEDFUSION PUMP - OPTIME
INTRAMUSCULAR | Status: DC | PRN
  Administered 2015-09-30: 100 mg via INTRAVENOUS

## 2015-09-30 MED ORDER — MORPHINE SULFATE (PF) 2 MG/ML IV SOLN
INTRAVENOUS | Status: AC
  Administered 2015-09-30: 2 mg via INTRAVENOUS
  Filled 2015-09-30: qty 1

## 2015-09-30 MED ORDER — MIDAZOLAM HCL 2 MG/2ML IJ SOLN
INTRAMUSCULAR | Status: DC | PRN
  Administered 2015-09-30: 1 mg via INTRAVENOUS

## 2015-09-30 MED ORDER — PLASMA-LYTE 148 IV SOLN
INTRAVENOUS | Status: DC
  Filled 2015-09-30: qty 2.5

## 2015-09-30 MED ORDER — SODIUM CHLORIDE 0.9 % IV SOLN
250.0000 [IU] | INTRAVENOUS | Status: DC | PRN
  Administered 2015-09-30: 1 [IU]/h via INTRAVENOUS

## 2015-09-30 MED ORDER — SODIUM CHLORIDE 0.9 % IV SOLN: 250.0000 mL | INTRAVENOUS | Status: DC

## 2015-09-30 MED ORDER — LIDOCAINE HCL (PF) 1 % IJ SOLN
INTRAMUSCULAR | Status: DC | PRN
  Administered 2015-09-30: 3 mL

## 2015-09-30 MED ORDER — FENTANYL CITRATE (PF) 100 MCG/2ML IJ SOLN
INTRAMUSCULAR | Status: DC | PRN
  Administered 2015-09-30 (×3): 25 ug via INTRAVENOUS

## 2015-09-30 MED ORDER — PHENYLEPHRINE 40 MCG/ML (10ML) SYRINGE FOR IV PUSH (FOR BLOOD PRESSURE SUPPORT)
PREFILLED_SYRINGE | INTRAVENOUS | Status: AC
  Filled 2015-09-30: qty 10

## 2015-09-30 MED ORDER — SODIUM CHLORIDE 0.9 % WEIGHT BASED INFUSION
1.0000 mL/kg/h | INTRAVENOUS | Status: DC
  Administered 2015-09-30: 1 mL via INTRAVENOUS

## 2015-09-30 MED ORDER — DOCUSATE SODIUM 100 MG PO CAPS
200.0000 mg | ORAL_CAPSULE | Freq: Every day | ORAL | Status: DC
  Administered 2015-10-01 – 2015-10-02 (×2): 200 mg via ORAL
  Filled 2015-09-30 (×3): qty 2

## 2015-09-30 MED ORDER — ONDANSETRON HCL 4 MG/2ML IJ SOLN
4.0000 mg | Freq: Four times a day (QID) | INTRAMUSCULAR | Status: DC | PRN
  Administered 2015-10-01: 4 mg via INTRAVENOUS
  Filled 2015-09-30: qty 2

## 2015-09-30 MED ORDER — DEXMEDETOMIDINE HCL IN NACL 400 MCG/100ML IV SOLN
INTRAVENOUS | Status: DC | PRN
  Administered 2015-09-30: .5 ug/kg/h via INTRAVENOUS

## 2015-09-30 MED ORDER — OXYCODONE HCL 5 MG PO TABS
5.0000 mg | ORAL_TABLET | ORAL | Status: DC | PRN
  Administered 2015-10-01: 5 mg via ORAL
  Filled 2015-09-30: qty 1

## 2015-09-30 MED ORDER — NITROGLYCERIN IN D5W 200-5 MCG/ML-% IV SOLN
2.0000 ug/min | INTRAVENOUS | Status: DC
  Administered 2015-09-30: 10 ug/min via INTRAVENOUS
  Administered 2015-09-30: 5 ug/min via INTRAVENOUS

## 2015-09-30 MED ORDER — HEPARIN (PORCINE) IN NACL 2-0.9 UNIT/ML-% IJ SOLN
INTRAMUSCULAR | Status: AC
  Filled 2015-09-30: qty 1500

## 2015-09-30 MED ORDER — PROPOFOL 10 MG/ML IV BOLUS
INTRAVENOUS | Status: DC | PRN
  Administered 2015-09-30: 80 mg via INTRAVENOUS

## 2015-09-30 MED ORDER — SODIUM CHLORIDE 0.9 % IV SOLN
INTRAVENOUS | Status: DC
  Administered 2015-09-30: 14:00:00 via INTRAVENOUS

## 2015-09-30 MED ORDER — METOPROLOL TARTRATE 25 MG/10 ML ORAL SUSPENSION: 12.5000 mg | Freq: Two times a day (BID) | ORAL | Status: DC

## 2015-09-30 MED ORDER — DEXTROSE 5 % IV SOLN
750.0000 mg | INTRAVENOUS | Status: DC
  Filled 2015-09-30: qty 750

## 2015-09-30 MED ORDER — HEPARIN SODIUM (PORCINE) 5000 UNIT/ML IJ SOLN
4000.0000 [IU] | Freq: Once | INTRAMUSCULAR | Status: AC
  Administered 2015-09-30: 4000 [IU] via INTRAVENOUS

## 2015-09-30 MED ORDER — ACETAMINOPHEN 325 MG PO TABS: 650.0000 mg | ORAL_TABLET | ORAL | Status: DC | PRN

## 2015-09-30 MED ORDER — NITROGLYCERIN IN D5W 200-5 MCG/ML-% IV SOLN
2.0000 ug/min | INTRAVENOUS | Status: DC
  Filled 2015-09-30: qty 250

## 2015-09-30 MED ORDER — FENTANYL CITRATE (PF) 100 MCG/2ML IJ SOLN
INTRAMUSCULAR | Status: AC
  Filled 2015-09-30: qty 2

## 2015-09-30 MED ORDER — MORPHINE SULFATE (PF) 2 MG/ML IV SOLN: 1.0000 mg | INTRAVENOUS | Status: DC | PRN

## 2015-09-30 MED ORDER — THROMBIN 20000 UNITS EX SOLR
CUTANEOUS | Status: DC | PRN
  Administered 2015-09-30: 20 mL via TOPICAL

## 2015-09-30 MED ORDER — HEPARIN SODIUM (PORCINE) 1000 UNIT/ML IJ SOLN
INTRAMUSCULAR | Status: DC | PRN
  Administered 2015-09-30: 22000 [IU] via INTRAVENOUS

## 2015-09-30 MED ORDER — DEXTROSE 5 % IV SOLN
1.5000 g | INTRAVENOUS | Status: DC | PRN
  Administered 2015-09-30: 1.5 g via INTRAVENOUS
  Administered 2015-09-30: .75 g via INTRAVENOUS

## 2015-09-30 MED ORDER — IOPAMIDOL (ISOVUE-370) INJECTION 76%
INTRAVENOUS | Status: DC | PRN
  Administered 2015-09-30: 155 mL via INTRA_ARTERIAL

## 2015-09-30 MED ORDER — LIDOCAINE HCL (CARDIAC) 20 MG/ML IV SOLN
INTRAVENOUS | Status: DC | PRN
  Administered 2015-09-30: 50 mg via INTRAVENOUS

## 2015-09-30 MED ORDER — ASPIRIN 81 MG PO CHEW
324.0000 mg | CHEWABLE_TABLET | Freq: Once | ORAL | Status: AC
  Administered 2015-09-30: 324 mg via ORAL

## 2015-09-30 MED ORDER — EPINEPHRINE HCL 1 MG/ML IJ SOLN
0.0000 ug/min | INTRAVENOUS | Status: DC
  Filled 2015-09-30: qty 4

## 2015-09-30 MED ORDER — DEXTROSE 5 % IV SOLN
1.5000 g | INTRAVENOUS | Status: DC
  Filled 2015-09-30: qty 1.5

## 2015-09-30 MED ORDER — SODIUM CHLORIDE 0.9 % IV SOLN
250.0000 mg | INTRAVENOUS | Status: DC | PRN
  Administered 2015-09-30: 1.75 mg/kg/h via INTRAVENOUS

## 2015-09-30 MED ORDER — VERAPAMIL HCL 2.5 MG/ML IV SOLN
INTRAVENOUS | Status: DC | PRN
  Administered 2015-09-30: 05:00:00 via INTRA_ARTERIAL

## 2015-09-30 MED ORDER — LIDOCAINE BOLUS VIA INFUSION
75.0000 mg | Freq: Once | INTRAVENOUS | Status: AC
  Administered 2015-09-30: 75 mg via INTRAVENOUS
  Filled 2015-09-30: qty 76

## 2015-09-30 MED ORDER — HEPARIN SODIUM (PORCINE) 5000 UNIT/ML IJ SOLN
INTRAMUSCULAR | Status: AC
  Administered 2015-09-30: 4000 [IU] via INTRAVENOUS
  Filled 2015-09-30: qty 1

## 2015-09-30 MED ORDER — ROCURONIUM BROMIDE 50 MG/5ML IV SOLN
INTRAVENOUS | Status: AC
  Filled 2015-09-30: qty 1

## 2015-09-30 MED ORDER — MAGNESIUM SULFATE 4 GM/100ML IV SOLN
4.0000 g | Freq: Once | INTRAVENOUS | Status: AC
  Administered 2015-09-30: 4 g via INTRAVENOUS
  Filled 2015-09-30: qty 100

## 2015-09-30 MED ORDER — LEVALBUTEROL HCL 0.63 MG/3ML IN NEBU
0.6300 mg | INHALATION_SOLUTION | Freq: Four times a day (QID) | RESPIRATORY_TRACT | Status: DC
  Administered 2015-09-30 – 2015-10-01 (×5): 0.63 mg via RESPIRATORY_TRACT
  Filled 2015-09-30 (×5): qty 3

## 2015-09-30 MED ORDER — MUPIROCIN 2 % EX OINT
1.0000 "application " | TOPICAL_OINTMENT | Freq: Two times a day (BID) | CUTANEOUS | Status: AC
  Administered 2015-09-30 – 2015-10-05 (×10): 1 via NASAL
  Filled 2015-09-30 (×2): qty 22

## 2015-09-30 MED ORDER — 0.9 % SODIUM CHLORIDE (POUR BTL) OPTIME
TOPICAL | Status: DC | PRN
  Administered 2015-09-30: 3000 mL

## 2015-09-30 MED ORDER — MIDAZOLAM HCL 10 MG/2ML IJ SOLN
INTRAMUSCULAR | Status: AC
  Filled 2015-09-30: qty 2

## 2015-09-30 MED ORDER — LIDOCAINE HCL (PF) 1 % IJ SOLN
INTRAMUSCULAR | Status: AC
  Filled 2015-09-30: qty 30

## 2015-09-30 MED ORDER — DOPAMINE-DEXTROSE 3.2-5 MG/ML-% IV SOLN
0.0000 ug/kg/min | INTRAVENOUS | Status: DC
Start: 2015-09-30 — End: 2015-09-30
  Filled 2015-09-30: qty 250

## 2015-09-30 MED ORDER — ASPIRIN 300 MG RE SUPP: 300.0000 mg | RECTAL | Status: DC

## 2015-09-30 MED ORDER — VANCOMYCIN HCL 10 G IV SOLR
1250.0000 mg | INTRAVENOUS | Status: DC
  Filled 2015-09-30: qty 1250

## 2015-09-30 MED ORDER — VANCOMYCIN HCL IN DEXTROSE 1-5 GM/200ML-% IV SOLN
1000.0000 mg | Freq: Once | INTRAVENOUS | Status: AC
Start: 2015-09-30 — End: 2015-09-30
  Administered 2015-09-30: 1000 mg via INTRAVENOUS
  Filled 2015-09-30: qty 200

## 2015-09-30 MED ORDER — SODIUM CHLORIDE 0.9 % IV SOLN
INTRAVENOUS | Status: DC
  Filled 2015-09-30: qty 40

## 2015-09-30 MED ORDER — POTASSIUM CHLORIDE 10 MEQ/50ML IV SOLN: 10.0000 meq | INTRAVENOUS | Status: AC

## 2015-09-30 MED ORDER — INSULIN REGULAR BOLUS VIA INFUSION
0.0000 [IU] | Freq: Three times a day (TID) | INTRAVENOUS | Status: DC
  Administered 2015-10-01: 2 [IU] via INTRAVENOUS
  Filled 2015-09-30: qty 10

## 2015-09-30 MED ORDER — BIVALIRUDIN 250 MG IV SOLR
INTRAVENOUS | Status: AC
  Filled 2015-09-30: qty 250

## 2015-09-30 MED ORDER — SODIUM CHLORIDE 0.9 % IV SOLN
10.0000 g | INTRAVENOUS | Status: DC | PRN
  Administered 2015-09-30: 1 g via INTRAVENOUS
  Administered 2015-09-30: 5 g via INTRAVENOUS

## 2015-09-30 MED ORDER — MORPHINE SULFATE (PF) 2 MG/ML IV SOLN: 1.0000 mg | INTRAVENOUS | Status: AC | PRN

## 2015-09-30 MED ORDER — NITROGLYCERIN 1 MG/10 ML FOR IR/CATH LAB
INTRA_ARTERIAL | Status: AC
  Filled 2015-09-30: qty 10

## 2015-09-30 MED ORDER — DEXMEDETOMIDINE HCL IN NACL 400 MCG/100ML IV SOLN
0.1000 ug/kg/h | INTRAVENOUS | Status: DC
  Filled 2015-09-30: qty 100

## 2015-09-30 MED ORDER — SODIUM CHLORIDE 0.9 % IV BOLUS (SEPSIS)
1000.0000 mL | Freq: Once | INTRAVENOUS | Status: AC
  Administered 2015-09-30: 1000 mL via INTRAVENOUS

## 2015-09-30 MED ORDER — PLASMA-LYTE 148 IV SOLN
INTRAVENOUS | Status: DC | PRN
  Administered 2015-09-30: 500 mL via INTRAVASCULAR

## 2015-09-30 MED ORDER — SODIUM CHLORIDE 0.45 % IV SOLN
INTRAVENOUS | Status: DC | PRN
  Administered 2015-09-30: 14:00:00 via INTRAVENOUS

## 2015-09-30 MED ORDER — MORPHINE SULFATE (PF) 2 MG/ML IV SOLN
2.0000 mg | INTRAVENOUS | Status: DC | PRN
  Administered 2015-09-30 – 2015-10-01 (×6): 2 mg via INTRAVENOUS
  Administered 2015-10-01: 4 mg via INTRAVENOUS
  Administered 2015-10-01 (×3): 2 mg via INTRAVENOUS
  Filled 2015-09-30: qty 2
  Filled 2015-09-30 (×5): qty 1
  Filled 2015-09-30: qty 2
  Filled 2015-09-30 (×2): qty 1

## 2015-09-30 MED ORDER — ATORVASTATIN CALCIUM 80 MG PO TABS
80.0000 mg | ORAL_TABLET | Freq: Every day | ORAL | Status: DC
  Administered 2015-10-01 – 2015-10-02 (×2): 80 mg via ORAL
  Filled 2015-09-30 (×3): qty 1

## 2015-09-30 MED ORDER — SODIUM CHLORIDE 0.9 % IV SOLN: Freq: Once | INTRAVENOUS | Status: DC

## 2015-09-30 MED ORDER — ALBUMIN HUMAN 5 % IV SOLN
INTRAVENOUS | Status: DC | PRN
  Administered 2015-09-30 (×2): via INTRAVENOUS

## 2015-09-30 MED ORDER — ACETAMINOPHEN 650 MG RE SUPP
650.0000 mg | Freq: Once | RECTAL | Status: AC
  Administered 2015-09-30: 650 mg via RECTAL

## 2015-09-30 MED ORDER — FENTANYL CITRATE (PF) 100 MCG/2ML IJ SOLN
INTRAMUSCULAR | Status: DC | PRN
  Administered 2015-09-30: 150 ug via INTRAVENOUS
  Administered 2015-09-30: 100 ug via INTRAVENOUS
  Administered 2015-09-30: 150 ug via INTRAVENOUS
  Administered 2015-09-30 (×2): 100 ug via INTRAVENOUS
  Administered 2015-09-30: 250 ug via INTRAVENOUS
  Administered 2015-09-30: 150 ug via INTRAVENOUS
  Administered 2015-09-30: 100 ug via INTRAVENOUS

## 2015-09-30 MED ORDER — SODIUM CHLORIDE 0.9% FLUSH
3.0000 mL | Freq: Two times a day (BID) | INTRAVENOUS | Status: DC
  Administered 2015-10-01 – 2015-10-03 (×5): 3 mL via INTRAVENOUS

## 2015-09-30 MED ORDER — MIDAZOLAM HCL 2 MG/2ML IJ SOLN: 2.0000 mg | INTRAMUSCULAR | Status: DC | PRN

## 2015-09-30 MED ORDER — LACTATED RINGERS IV SOLN
500.0000 mL | Freq: Once | INTRAVENOUS | Status: AC | PRN
  Administered 2015-09-30: 500 mL via INTRAVENOUS

## 2015-09-30 MED ORDER — LIDOCAINE IN D5W 4-5 MG/ML-% IV SOLN
1.0000 mg/min | INTRAVENOUS | Status: DC
  Administered 2015-09-30: 1 mg/min via INTRAVENOUS
  Filled 2015-09-30: qty 500

## 2015-09-30 MED ORDER — SODIUM CHLORIDE 0.9 % IV SOLN
INTRAVENOUS | Status: DC
Start: 2015-09-30 — End: 2015-09-30
  Filled 2015-09-30: qty 30

## 2015-09-30 MED ORDER — TRAMADOL HCL 50 MG PO TABS: 50.0000 mg | ORAL_TABLET | ORAL | Status: DC | PRN

## 2015-09-30 MED ORDER — BISACODYL 5 MG PO TBEC
10.0000 mg | DELAYED_RELEASE_TABLET | Freq: Every day | ORAL | Status: DC
  Administered 2015-10-01 – 2015-10-02 (×3): 10 mg via ORAL
  Filled 2015-09-30 (×3): qty 2

## 2015-09-30 MED ORDER — SODIUM CHLORIDE 0.9% FLUSH: 3.0000 mL | INTRAVENOUS | Status: DC | PRN

## 2015-09-30 MED ORDER — CHLORHEXIDINE GLUCONATE 0.12 % MT SOLN
15.0000 mL | OROMUCOSAL | Status: AC
  Administered 2015-09-30: 15 mL via OROMUCOSAL

## 2015-09-30 MED ORDER — SODIUM CHLORIDE 0.9% FLUSH: 3.0000 mL | Freq: Two times a day (BID) | INTRAVENOUS | Status: DC

## 2015-09-30 MED ORDER — DEXMEDETOMIDINE HCL IN NACL 200 MCG/50ML IV SOLN
0.0000 ug/kg/h | INTRAVENOUS | Status: DC
  Filled 2015-09-30: qty 50

## 2015-09-30 MED ORDER — ALBUMIN HUMAN 5 % IV SOLN
250.0000 mL | INTRAVENOUS | Status: AC | PRN
  Administered 2015-09-30 (×3): 250 mL via INTRAVENOUS
  Filled 2015-09-30: qty 250

## 2015-09-30 MED ORDER — LACTATED RINGERS IV SOLN
INTRAVENOUS | Status: DC
  Administered 2015-09-30: 20 mL/h via INTRAVENOUS
  Administered 2015-09-30: 14:00:00 via INTRAVENOUS

## 2015-09-30 MED ORDER — NITROGLYCERIN 1 MG/10 ML FOR IR/CATH LAB
INTRA_ARTERIAL | Status: DC | PRN
  Administered 2015-09-30: 200 ug via INTRA_ARTERIAL
  Administered 2015-09-30: 200 ug via INTRACORONARY

## 2015-09-30 MED ORDER — ASPIRIN 81 MG PO CHEW: 324.0000 mg | CHEWABLE_TABLET | Freq: Every day | ORAL | Status: DC

## 2015-09-30 MED ORDER — ONDANSETRON HCL 4 MG/2ML IJ SOLN
4.0000 mg | Freq: Once | INTRAMUSCULAR | Status: AC
  Administered 2015-09-30: 4 mg via INTRAVENOUS

## 2015-09-30 MED ORDER — HEPARIN (PORCINE) IN NACL 2-0.9 UNIT/ML-% IJ SOLN
INTRAMUSCULAR | Status: DC | PRN
  Administered 2015-09-30: 1500 mL

## 2015-09-30 MED ORDER — ANTISEPTIC ORAL RINSE SOLUTION (CORINZ)
7.0000 mL | OROMUCOSAL | Status: DC
  Administered 2015-09-30 – 2015-10-02 (×6): 7 mL via OROMUCOSAL

## 2015-09-30 MED ORDER — ASPIRIN EC 81 MG PO TBEC: 81.0000 mg | DELAYED_RELEASE_TABLET | Freq: Every day | ORAL | Status: DC

## 2015-09-30 MED ORDER — PHENYLEPHRINE HCL 10 MG/ML IJ SOLN
0.0000 ug/min | INTRAVENOUS | Status: DC
  Administered 2015-10-01: 20 ug/min via INTRAVENOUS
  Filled 2015-09-30: qty 2

## 2015-09-30 MED ORDER — PROPOFOL 10 MG/ML IV BOLUS
INTRAVENOUS | Status: AC
  Filled 2015-09-30: qty 20

## 2015-09-30 MED ORDER — POTASSIUM CHLORIDE 2 MEQ/ML IV SOLN
80.0000 meq | INTRAVENOUS | Status: DC
  Filled 2015-09-30: qty 40

## 2015-09-30 MED ORDER — NITROGLYCERIN IN D5W 200-5 MCG/ML-% IV SOLN
INTRAVENOUS | Status: DC | PRN
  Administered 2015-09-30: 5 ug/min via INTRAVENOUS

## 2015-09-30 MED ORDER — BISACODYL 10 MG RE SUPP: 10.0000 mg | Freq: Every day | RECTAL | Status: DC

## 2015-09-30 MED ORDER — CETYLPYRIDINIUM CHLORIDE 0.05 % MT LIQD
7.0000 mL | Freq: Two times a day (BID) | OROMUCOSAL | Status: DC
  Administered 2015-10-01: 7 mL via OROMUCOSAL

## 2015-09-30 MED ORDER — HEPARIN SODIUM (PORCINE) 1000 UNIT/ML IJ SOLN
INTRAMUSCULAR | Status: DC | PRN
  Administered 2015-09-30: 3500 [IU] via INTRAVENOUS

## 2015-09-30 MED ORDER — TICAGRELOR 90 MG PO TABS
ORAL_TABLET | ORAL | Status: AC
  Filled 2015-09-30: qty 2

## 2015-09-30 MED ORDER — DEXMEDETOMIDINE HCL IN NACL 400 MCG/100ML IV SOLN: INTRAVENOUS | Status: DC | PRN

## 2015-09-30 MED FILL — Potassium Chloride Inj 2 mEq/ML: INTRAVENOUS | Qty: 40 | Status: AC

## 2015-09-30 MED FILL — Mannitol IV Soln 20%: INTRAVENOUS | Qty: 500 | Status: AC

## 2015-09-30 MED FILL — Lidocaine HCl IV Inj 20 MG/ML: INTRAVENOUS | Qty: 5 | Status: AC

## 2015-09-30 MED FILL — Heparin Sodium (Porcine) Inj 1000 Unit/ML: INTRAMUSCULAR | Qty: 30 | Status: AC

## 2015-09-30 MED FILL — Heparin Sodium (Porcine) Inj 1000 Unit/ML: INTRAMUSCULAR | Qty: 10 | Status: AC

## 2015-09-30 MED FILL — Magnesium Sulfate Inj 50%: INTRAMUSCULAR | Qty: 10 | Status: AC

## 2015-09-30 MED FILL — Sodium Chloride IV Soln 0.9%: INTRAVENOUS | Qty: 2000 | Status: AC

## 2015-09-30 MED FILL — Electrolyte-R (PH 7.4) Solution: INTRAVENOUS | Qty: 3000 | Status: AC

## 2015-09-30 MED FILL — Sodium Bicarbonate IV Soln 8.4%: INTRAVENOUS | Qty: 50 | Status: AC

## 2015-09-30 SURGICAL SUPPLY — 98 items
BAG DECANTER FOR FLEXI CONT (MISCELLANEOUS) ×2 IMPLANT
BANDAGE ELASTIC 4 VELCRO ST LF (GAUZE/BANDAGES/DRESSINGS) ×2 IMPLANT
BANDAGE ELASTIC 6 VELCRO ST LF (GAUZE/BANDAGES/DRESSINGS) ×2 IMPLANT
BASKET HEART (ORDER IN 25'S) (MISCELLANEOUS) ×1
BASKET HEART (ORDER IN 25S) (MISCELLANEOUS) ×1 IMPLANT
BLADE STERNUM SYSTEM 6 (BLADE) ×2 IMPLANT
BNDG GAUZE ELAST 4 BULKY (GAUZE/BANDAGES/DRESSINGS) ×2 IMPLANT
CANISTER SUCTION 2500CC (MISCELLANEOUS) ×2 IMPLANT
CATH ROBINSON RED A/P 18FR (CATHETERS) ×4 IMPLANT
CATH THORACIC 28FR (CATHETERS) ×2 IMPLANT
CATH THORACIC 36FR (CATHETERS) ×2 IMPLANT
CATH THORACIC 36FR RT ANG (CATHETERS) ×2 IMPLANT
CLIP TI MEDIUM 24 (CLIP) IMPLANT
CLIP TI WIDE RED SMALL 24 (CLIP) IMPLANT
COVER SURGICAL LIGHT HANDLE (MISCELLANEOUS) ×2 IMPLANT
CRADLE DONUT ADULT HEAD (MISCELLANEOUS) ×2 IMPLANT
DRAPE CARDIOVASCULAR INCISE (DRAPES) ×1
DRAPE SLUSH/WARMER DISC (DRAPES) ×2 IMPLANT
DRAPE SRG 135X102X78XABS (DRAPES) ×1 IMPLANT
DRSG COVADERM 4X14 (GAUZE/BANDAGES/DRESSINGS) ×2 IMPLANT
DRSG COVADERM 4X8 (GAUZE/BANDAGES/DRESSINGS) ×2 IMPLANT
ELECT CAUTERY BLADE 6.4 (BLADE) ×2 IMPLANT
ELECT REM PT RETURN 9FT ADLT (ELECTROSURGICAL) ×4
ELECTRODE REM PT RTRN 9FT ADLT (ELECTROSURGICAL) ×2 IMPLANT
FELT TEFLON 1X6 (MISCELLANEOUS) ×2 IMPLANT
GAUZE SPONGE 4X4 12PLY STRL (GAUZE/BANDAGES/DRESSINGS) ×4 IMPLANT
GLOVE BIO SURGEON STRL SZ 6 (GLOVE) ×2 IMPLANT
GLOVE BIO SURGEON STRL SZ 6.5 (GLOVE) ×8 IMPLANT
GLOVE BIO SURGEON STRL SZ7 (GLOVE) ×2 IMPLANT
GLOVE BIO SURGEON STRL SZ7.5 (GLOVE) ×8 IMPLANT
GLOVE BIOGEL PI IND STRL 6 (GLOVE) ×3 IMPLANT
GLOVE BIOGEL PI IND STRL 6.5 (GLOVE) IMPLANT
GLOVE BIOGEL PI IND STRL 7.0 (GLOVE) IMPLANT
GLOVE BIOGEL PI INDICATOR 6 (GLOVE) ×3
GLOVE BIOGEL PI INDICATOR 6.5 (GLOVE)
GLOVE BIOGEL PI INDICATOR 7.0 (GLOVE)
GLOVE EUDERMIC 7 POWDERFREE (GLOVE) ×4 IMPLANT
GLOVE ORTHO TXT STRL SZ7.5 (GLOVE) IMPLANT
GOWN STRL REUS W/ TWL LRG LVL3 (GOWN DISPOSABLE) ×4 IMPLANT
GOWN STRL REUS W/ TWL XL LVL3 (GOWN DISPOSABLE) ×1 IMPLANT
GOWN STRL REUS W/TWL LRG LVL3 (GOWN DISPOSABLE) ×4
GOWN STRL REUS W/TWL XL LVL3 (GOWN DISPOSABLE) ×1
HEMOSTAT POWDER SURGIFOAM 1G (HEMOSTASIS) ×8 IMPLANT
HEMOSTAT SURGICEL 2X14 (HEMOSTASIS) ×2 IMPLANT
INSERT FOGARTY 61MM (MISCELLANEOUS) IMPLANT
INSERT FOGARTY XLG (MISCELLANEOUS) IMPLANT
KIT BASIN OR (CUSTOM PROCEDURE TRAY) ×2 IMPLANT
KIT CATH CPB BARTLE (MISCELLANEOUS) ×2 IMPLANT
KIT ROOM TURNOVER OR (KITS) ×2 IMPLANT
KIT SUCTION CATH 14FR (SUCTIONS) ×2 IMPLANT
KIT VASOVIEW 6 PRO VH 2400 (KITS) ×2 IMPLANT
NS IRRIG 1000ML POUR BTL (IV SOLUTION) ×10 IMPLANT
PACK OPEN HEART (CUSTOM PROCEDURE TRAY) ×2 IMPLANT
PAD ARMBOARD 7.5X6 YLW CONV (MISCELLANEOUS) ×4 IMPLANT
PAD ELECT DEFIB RADIOL ZOLL (MISCELLANEOUS) ×2 IMPLANT
PENCIL BUTTON HOLSTER BLD 10FT (ELECTRODE) ×2 IMPLANT
PUNCH AORTIC ROTATE 4.0MM (MISCELLANEOUS) IMPLANT
PUNCH AORTIC ROTATE 4.5MM 8IN (MISCELLANEOUS) ×2 IMPLANT
PUNCH AORTIC ROTATE 5MM 8IN (MISCELLANEOUS) IMPLANT
SET CARDIOPLEGIA MPS 5001102 (MISCELLANEOUS) ×2 IMPLANT
SPONGE GAUZE 4X4 12PLY STER LF (GAUZE/BANDAGES/DRESSINGS) ×4 IMPLANT
SPONGE INTESTINAL PEANUT (DISPOSABLE) IMPLANT
SPONGE LAP 18X18 X RAY DECT (DISPOSABLE) IMPLANT
SPONGE LAP 4X18 X RAY DECT (DISPOSABLE) ×2 IMPLANT
SUT BONE WAX W31G (SUTURE) ×2 IMPLANT
SUT MNCRL AB 4-0 PS2 18 (SUTURE) IMPLANT
SUT PROLENE 3 0 SH DA (SUTURE) IMPLANT
SUT PROLENE 3 0 SH1 36 (SUTURE) ×2 IMPLANT
SUT PROLENE 4 0 RB 1 (SUTURE)
SUT PROLENE 4 0 SH DA (SUTURE) IMPLANT
SUT PROLENE 4-0 RB1 .5 CRCL 36 (SUTURE) IMPLANT
SUT PROLENE 5 0 C 1 36 (SUTURE) IMPLANT
SUT PROLENE 6 0 C 1 30 (SUTURE) IMPLANT
SUT PROLENE 7 0 BV 1 (SUTURE) IMPLANT
SUT PROLENE 7 0 BV1 MDA (SUTURE) ×2 IMPLANT
SUT PROLENE 8 0 BV175 6 (SUTURE) IMPLANT
SUT SILK  1 MH (SUTURE)
SUT SILK 1 MH (SUTURE) IMPLANT
SUT STEEL STERNAL CCS#1 18IN (SUTURE) IMPLANT
SUT STEEL SZ 6 DBL 3X14 BALL (SUTURE) ×6 IMPLANT
SUT VIC AB 1 CTX 36 (SUTURE) ×2
SUT VIC AB 1 CTX36XBRD ANBCTR (SUTURE) ×2 IMPLANT
SUT VIC AB 2-0 CT1 27 (SUTURE) ×1
SUT VIC AB 2-0 CT1 TAPERPNT 27 (SUTURE) ×1 IMPLANT
SUT VIC AB 2-0 CTX 27 (SUTURE) IMPLANT
SUT VIC AB 3-0 SH 27 (SUTURE)
SUT VIC AB 3-0 SH 27X BRD (SUTURE) IMPLANT
SUT VIC AB 3-0 X1 27 (SUTURE) ×2 IMPLANT
SUT VICRYL 4-0 PS2 18IN ABS (SUTURE) IMPLANT
SUTURE E-PAK OPEN HEART (SUTURE) ×2 IMPLANT
SYSTEM SAHARA CHEST DRAIN ATS (WOUND CARE) ×2 IMPLANT
TAPE CLOTH SURG 4X10 WHT LF (GAUZE/BANDAGES/DRESSINGS) ×2 IMPLANT
TOWEL OR 17X24 6PK STRL BLUE (TOWEL DISPOSABLE) ×2 IMPLANT
TOWEL OR 17X26 10 PK STRL BLUE (TOWEL DISPOSABLE) ×2 IMPLANT
TRAY FOLEY IC TEMP SENS 16FR (CATHETERS) ×2 IMPLANT
TUBING INSUFFLATION (TUBING) ×2 IMPLANT
UNDERPAD 30X30 INCONTINENT (UNDERPADS AND DIAPERS) ×2 IMPLANT
WATER STERILE IRR 1000ML POUR (IV SOLUTION) ×4 IMPLANT

## 2015-09-30 SURGICAL SUPPLY — 14 items
CATH INFINITI 5FR ANG PIGTAIL (CATHETERS) ×2 IMPLANT
CATH OPTITORQUE TIG 4.0 5F (CATHETERS) ×2 IMPLANT
CATH VISTA GUIDE 6FR JR4 (CATHETERS) ×2 IMPLANT
CATH VISTA GUIDE 6FR XBLAD3.5 (CATHETERS) ×2 IMPLANT
DEVICE RAD COMP TR BAND LRG (VASCULAR PRODUCTS) ×2 IMPLANT
ELECT DEFIB PAD ADLT CADENCE (PAD) ×2 IMPLANT
GLIDESHEATH SLEND A-KIT 6F 22G (SHEATH) ×2 IMPLANT
KIT ENCORE 26 ADVANTAGE (KITS) ×2 IMPLANT
KIT HEART LEFT (KITS) ×2 IMPLANT
PACK CARDIAC CATHETERIZATION (CUSTOM PROCEDURE TRAY) ×2 IMPLANT
TRANSDUCER W/STOPCOCK (MISCELLANEOUS) ×2 IMPLANT
TUBING CIL FLEX 10 FLL-RA (TUBING) ×2 IMPLANT
WIRE HI TORQ VERSACORE-J 145CM (WIRE) ×2 IMPLANT
WIRE SAFE-T 1.5MM-J .035X260CM (WIRE) ×2 IMPLANT

## 2015-09-30 NOTE — ED Provider Notes (Signed)
[facility] Emergency Department Provider Note   ____________________________________________  Time seen: Approximately 4:07 AM  I have reviewed the triage vital signs and the nursing notes.   HISTORY  Chief Complaint Chest Pain    HPI Kerri Hicks is a 70 y.o. female who presents to the ED from home with a chief complain of chest pain. Patient reports waxing/waning mid chest pressure awakening her approximately midnight. Reports bilateral arms feeling heavy and tight. Symptoms associated with diaphoresis and nausea.Denies associated shortness of breath, palpitations. Denies recent fever, chills, cough, congestion, abdominal pain, vomiting, diarrhea, dysuria. Denies recent travel or trauma. Nothing makes her symptoms better or worse.   Past medical history Borderline diabetes  There are no active problems to display for this patient.   No past surgical history on file.  No current outpatient prescriptions on file.  Denies taking prescription medications.  Allergies Review of patient's allergies indicates no known allergies.  Family History Mother with CAD  Social History Social History  Substance Use Topics  . Smoking status: Not on file  . Smokeless tobacco: Not on file  . Alcohol Use: Not on file  +Smoker  Review of Systems  Constitutional: No fever/chills. Eyes: No visual changes. ENT: No sore throat. Cardiovascular: Positive for chest pain. Respiratory: Denies shortness of breath. Gastrointestinal: No abdominal pain.  Positive for nausea, no vomiting.  No diarrhea.  No constipation. Genitourinary: Negative for dysuria. Musculoskeletal: Negative for back pain. Skin: Positive for diaphoresis. Negative for rash. Neurological: Negative for headaches, focal weakness or numbness.  10-point ROS otherwise negative.  ____________________________________________   PHYSICAL EXAM:  VITAL SIGNS: ED Triage Vitals  Enc Vitals  Group     BP 09/30/15 0405 155/85 mmHg     Pulse Rate 09/30/15 0403 81     Resp 09/30/15 0403 18     Temp 09/30/15 0403 97.6 F (36.4 C)     Temp Source 09/30/15 0403 Oral     SpO2 09/30/15 0405 100 %     Weight 09/30/15 0403 179 lb 8 oz (81.421 kg)     Height 09/30/15 0403 5\' 2"  (1.575 m)     Head Cir --      Peak Flow --      Pain Score --      Pain Loc --      Pain Edu? --      Excl. in [place]? --     Constitutional: Alert and oriented. Well appearing and in mild acute distress. Eyes: Conjunctivae are normal. PERRL. EOMI. Head: Atraumatic. Nose: No congestion/rhinnorhea. Mouth/Throat: Mucous membranes are moist.  Oropharynx non-erythematous. Neck: No stridor.   Cardiovascular: Normal rate, regular rhythm. Grossly normal heart sounds.  Good peripheral circulation. Respiratory: Normal respiratory effort.  No retractions. Lungs CTAB. Gastrointestinal: Soft and nontender. No distention. No abdominal bruits. No CVA tenderness. Musculoskeletal: No lower extremity tenderness nor edema.  No joint effusions. Neurologic:  Normal speech and language. No gross focal neurologic deficits are appreciated. No gait instability. Skin:  Skin is pale, warm, dry and intact. No rash noted. Psychiatric: Mood and affect are normal. Speech and behavior are normal.  ____________________________________________   LABS (all labs ordered are listed, but only abnormal results are displayed)  Labs Reviewed  CBC WITH DIFFERENTIAL/PLATELET  COMPREHENSIVE METABOLIC PANEL  TROPONIN I  PROTIME-INR   ____________________________________________  EKG  ED ECG REPORT I, SUNG,JADE J, the attending physician, personally viewed and interpreted this ECG.   Date: 09/30/2015  EKG Time: 0358  Rate: 74  Rhythm: STEMI, NSR  Axis: WNL  Intervals:none  ST&T Change: ST elevation anterior  leads  ____________________________________________  RADIOLOGY  Pending ____________________________________________   PROCEDURES  Procedure(s) performed: None  Critical Care performed: Yes, see critical care note(s)   CRITICAL CARE Performed by: Paulette Blanch   Total critical care time: 30 minutes  Critical care time was exclusive of separately billable procedures and treating other patients.  Critical care was necessary to treat or prevent imminent or life-threatening deterioration.  Critical care was time spent personally by me on the following activities: development of treatment plan with patient and/or surrogate as well as nursing, discussions with consultants, evaluation of patient's response to treatment, examination of patient, obtaining history from patient or surrogate, ordering and performing treatments and interventions, ordering and review of laboratory studies, ordering and review of radiographic studies, pulse oximetry and re-evaluation of patient's condition.  ____________________________________________   INITIAL IMPRESSION / ASSESSMENT AND PLAN / ED COURSE  Pertinent labs & imaging results that were available during my care of the patient were reviewed by me and considered in my medical decision making (see chart for details).  70 year old female who presents with chest pain, waxing/waning since midnight. EKG concerning for ST elevation MI. Code STEMI initiated. [facility] contacted for transfer for urgent catheterization. Discussed with Dr. Ellyn Hack who accepts patient for transfer. Recommends heparin bolus, no Plavix.  ----------------------------------------- 4:23 AM on 09/30/2015 -----------------------------------------  Chest pain decreased from 8/10 to 5/10 after first sublingual nitroglycerin. Blood pressure currently 123/55. Will initiate IV morphine for continued pain relief. ____________________________________________   FINAL CLINICAL  IMPRESSION(S) / ED DIAGNOSES  Final diagnoses:  Ischemic chest pain ([place])  ST elevation myocardial infarction (STEMI), unspecified artery (HCC)      NEW MEDICATIONS STARTED DURING THIS VISIT:  New Prescriptions   No medications on file     Note:  This document was prepared using Dragon voice recognition software and may include unintentional dictation errors.    Paulette Blanch, MD 09/30/15 [phone]

## 2015-09-30 NOTE — ED Notes (Signed)
Patient presents to ED with c/o chest pain since midnight, reports woke patient up due to chest pain. Patient reports felt diaphoretic and nasuea, denies shortness of breath. Patient appears pale, patient alert and oriented x 3.

## 2015-09-30 NOTE — Procedures (Signed)
Extubation Procedure Note  Patient Details:   Name: Kerri Hicks DOB: 03-May-1945 MRN: [id number]   Airway Documentation:     Evaluation  O2 sats: stable throughout Complications: No apparent complications Patient did tolerate procedure well. Bilateral Breath Sounds: Clear, Diminished     Pt passed NIF AND VC, positive for cuff leak. Extubated to 4L [place] with no apparent complications, pt able to speak in whispered voice. No stridor noted at this time. Will continue to monitor.   Danella Deis Zidan Helget 09/30/2015, 10:00 PM

## 2015-09-30 NOTE — Progress Notes (Signed)
  Echocardiogram Echocardiogram Transesophageal has been performed.  Bobbye Charleston 09/30/2015, 8:48 AM

## 2015-09-30 NOTE — Consult Note (Signed)
Kerri Hicks       Kerri Hicks,Kerri Hicks             [id number]      Cardiothoracic Surgery Consultation  Reason for Consult: Severe multi-vessel coronary artery disease Referring Physician: Dr. Glenetta Hew  Kerri Hicks is an 70 y.o. female.  HPI:   The patient is a 69 year old heavy smoker with diabetes and a family history of CAD who woke up around MN with SSCP radiating to her shoulders. She had never had that before. She called EMS but by the time they got there it resolved so she declined transport to the hospital. She went back to sleep and it recurred around 1:30 am. She went to [facility] and her CP improved with SL NTG. She received heparin, 4 baby aspirin and morphine and was transferred her to cath lab. CP was 1-2/10 on arrival here and then resolved. Cath shows 90% ostial LAD and 99% proximal to mid LAD lesions. The LCX has mild irregularity. The RCA has 85% mid vessel disease and distal stenosis before the takeoff of the PDA. LVEF preserved with apical hypo and elevated LVEDP of 21. She currently has no pain. Troponin 0.55 at 4 am and 1.40 at 5:45 with MB of 21. ECG at 4 am showed anterior ST elevation.  Past Medical History  Diagnosis Date  . Diabetes mellitus without complication ([place])     History reviewed. No pertinent past surgical history.  History reviewed. No pertinent family history.  Social History:  reports that she has been smoking Cigarettes.  She has a 50 pack-year smoking history. She does not have any smokeless tobacco history on file. Her alcohol and drug histories are not on file.  Allergies:  Allergies  Allergen Reactions  . Penicillins Rash    Medications:  I have reviewed the patient's current medications. Prior to Admission:  No prescriptions prior to admission   Scheduled: . sodium chloride   Intravenous Once  . [START ON 10/01/2015] aspirin EC  81 mg Oral Daily  . atorvastatin  80 mg Oral q1800  . metoprolol tartrate   12.5 mg Oral BID  . sodium chloride flush  3 mL Intravenous Q12H   Continuous: . sodium chloride 1 mL (09/30/15 0648)  . nitroGLYCERIN     AVW:PVXYIA chloride, acetaminophen, morphine injection, nitroGLYCERIN, ondansetron (ZOFRAN) IV, sodium chloride flush Anti-infectives    Start     Dose/Rate Route Frequency Ordered Stop   09/30/15 0645  vancomycin (VANCOCIN) 1,250 mg in sodium chloride 0.9 % 250 mL IVPB  Status:  Discontinued     1,250 mg 166.7 mL/hr over 90 Minutes Intravenous To Surgery 09/30/15 0640 09/30/15 0642   09/30/15 0645  cefUROXime (ZINACEF) 1.5 g in dextrose 5 % 50 mL IVPB  Status:  Discontinued     1.5 g 100 mL/hr over 30 Minutes Intravenous To Surgery 09/30/15 0640 09/30/15 0642   09/30/15 0645  cefUROXime (ZINACEF) 750 mg in dextrose 5 % 50 mL IVPB  Status:  Discontinued     750 mg 100 mL/hr over 30 Minutes Intravenous To Surgery 09/30/15 0640 09/30/15 0642      Results for orders placed or performed during the hospital encounter of 09/30/15 (from the past 48 hour(s))  CBC     Status: Abnormal   Collection Time: 09/30/15  5:45 AM  Result Value Ref Range   WBC 6.8 4.0 - 10.5 K/uL   RBC 3.82 (L) 3.87 -  5.11 MIL/uL   Hemoglobin 10.7 (L) 12.0 - 15.0 g/dL   HCT 34.1 (L) 36.0 - 46.0 %   MCV 89.3 78.0 - 100.0 fL   MCH 28.0 26.0 - 34.0 pg   MCHC 31.4 30.0 - 36.0 g/dL   RDW 14.8 11.5 - 15.5 %   Platelets 290 150 - 400 K/uL  POCT Activated clotting time     Status: None   Collection Time: 09/30/15  5:53 AM  Result Value Ref Range   Activated Clotting Time 428 seconds  I-STAT 3, arterial blood gas (G3+)     Status: Abnormal   Collection Time: 09/30/15  5:54 AM  Result Value Ref Range   pH, Arterial 7.332 (L) 7.350 - 7.450   pCO2 arterial 43.6 35.0 - 45.0 mmHg   pO2, Arterial 88.0 80.0 - 100.0 mmHg   Bicarbonate 23.1 20.0 - 24.0 mEq/L   TCO2 24 0 - 100 mmol/L   O2 Saturation 96.0 %   Acid-base deficit 3.0 (H) 0.0 - 2.0 mmol/L   Patient temperature HIDE     Sample type ARTERIAL     Dg Chest Port 1 View  09/30/2015  CLINICAL DATA:  Chest pain since midnight radiating down both arms. Diaphoresis and nausea. EXAM: PORTABLE CHEST 1 VIEW COMPARISON:  None. FINDINGS: The heart size and mediastinal contours are within normal limits. Both lungs are clear. The visualized skeletal structures are unremarkable. IMPRESSION: No active disease. Electronically Signed   By: Lucienne Capers M.D.   On: 09/30/2015 04:39    Review of Systems  Constitutional: Positive for diaphoresis. Negative for fever and malaise/fatigue.  HENT: Negative.   Eyes: Negative.   Respiratory: Positive for shortness of breath.   Cardiovascular: Positive for chest pain.  Gastrointestinal: Positive for nausea.  Genitourinary: Negative.   Musculoskeletal: Negative.   Skin: Negative.   Neurological: Negative.   Endo/Heme/Allergies: Negative.   Psychiatric/Behavioral: Negative.    Blood pressure 131/119, pulse 76, resp. rate 17, height 5' 2"  (1.575 m), weight 81.1 kg (178 lb 12.7 oz), SpO2 96 %. Physical Exam  Constitutional: She is oriented to person, place, and time. She appears well-developed and well-nourished. No distress.  HENT:  Head: Normocephalic and atraumatic.  Mouth/Throat: Oropharynx is clear and moist.  Eyes: EOM are normal. Pupils are equal, round, and reactive to light.  Neck: Normal range of motion. No JVD present. No thyromegaly present.  Cardiovascular: Normal rate, regular rhythm, normal heart sounds and intact distal pulses.   No murmur heard. Respiratory: Effort normal and breath sounds normal. No respiratory distress. She has no wheezes. She has no rales.  GI: Soft. Bowel sounds are normal. She exhibits no distension and no mass. There is no tenderness.  Musculoskeletal: Normal range of motion. She exhibits no edema or tenderness.  Lymphadenopathy:    She has no cervical adenopathy.  Neurological: She is alert and oriented to person, place, and time. She  has normal strength. No cranial nerve deficit or sensory deficit.  Skin: Skin is warm and dry.  Psychiatric: She has a normal mood and affect.    Leonie Man, MD (Primary)      Procedures    Left Heart Cath and Coronary Angiography    Conclusion    1. Ost LAD lesion, 90% stenosed. Prox-mid LAD lesion, 99% stenosed. 2. Presumably - small Ost 1st Diag to 1st Diag lesion, 100% stenosed. 3. Mid RCA lesion, 85% stenosed. Dist RCA lesion, 40% stenosed. 4. Post Atrio lesion, 99% stenosed. 5. Colon Flattery  1st Mrg to 1st Mrg lesion, 50% stenosed. 6. Preserved LVEF with apical hypokinesis. 7. Elevated LVEDP   With ostial LAD disease that is not amenable to PCI, the patient is best served with CABG the LIMA to LAD. The RCA may or may not be large enough to graft the posterolateral branch that has a significant lesion.  Plan:  The patient will be admitted to 2 S Surgical ICU, where she will be seen by Dr. Cyndia Bent who plans to post the patient for surgery today.  Preliminary preop orders for labs and blood have been sent  IV nitroglycerin initiated for blood pressure and pain relief.  Low-dose beta blocker and statin ordered  No IV heparin ordered as the patient is fully anticoagulated for close to hours following Angiomax bolus    Glenetta Hew, M.D., M.S. Interventional Cardiologist   Pager # [phone] Phone # [phone] [address] [place], [place] [address]      Indications    Acute ST elevation myocardial infarction (STEMI) involving left anterior descending (LAD) coronary artery (HCC) [I21.02 (ICD-10-CM)]    Technique and Indications    PCP: No PCP Per Patient CARDIOLOGIST: New - Glenetta Hew, MD  Ms. Ergle is a 70 yo woman with PMH of borderline diabetes, tobacco use (1ppd x 50 years), family history of CAD (Mother in mid 48s) who woke up with substernal chest pressure that radiated to her shoulders. She presented to [facility] and pain improved with SL  NTG. She received 4000 units heparin, 4 baby aspirin, zofran, morphine and was transferred to [facility] for emergent cath lab. On arrival chest discomfort down to 1-2/10.   She endorses daily 1ppd smoking although she expresses desire to quit. She denies functional limitation at home - she says she can do everything she likes, lives at home with her husband and performs her ADLs. She only takes occasional tylenol or ibuprofen.   She denies weight loss/weight gain. No easy bruising, no bleeding, no dark stools and no hematuria.   She was brought emergently to the cath lab.   Procedure Estimated blood loss <50 mL. There were no immediate complications during the procedure. Time Out: Verified patient identification, verified procedure, site/side was marked, verified correct patient position, special equipment/implants available, medications/allergies/relevent history reviewed, required imaging and test results available. Performed. Consent: Emergency consent implied due to emergency procedure.  During this procedure the patient is administered a total of Versed 1 mg and Fentanyl 75 mg to achieve and maintain moderate conscious sedation. The patient's heart rate, blood pressure, and oxygen saturation are monitored continuously during the procedure. The period of conscious sedation is 43 minutes, of which I was present face-to-face 100% of this time.  Access:  RIGHT Radial Artery: 6 Fr sheath -- Seldinger technique using Angiocath Micropuncture Kit 10 mL radial cocktail IA; 3500 Units IV Heparin  Left Heart Catheterization: 5 and 6 Fr Catheters advanced or exchanged over a J-wire under direct fluoroscopic guidance into the ascending aorta; 5 French TIG 4.0 catheter advanced first. - This catheter was not able to be manipulated into either coronary artery. Therefore the decision was made to switch to 6 [place] guide catheters. Left Coronary Artery Cineangiography: 6 French XB LAD 3.5 Guide Catheter    Right Coronary Artery Cineangiography: 6 [place] JR4 Guide Catheter  LV Hemodynamics (LV Gram): Unable to advance angled pigtail catheter beyond the arch; therefore, the JR4 catheter was used for hand injection left ventricular angiography.  Angiography revealed severe mid RCA disease  as well as ostial and mid LAD disease. The ostial LAD lesion is not amenable to PCI. Dr. Cyndia Bent from [place] surgery was consulted. He will see the patient upon arrival to the CCU. The patient was chest pain-free, therefore we decided to transfer the patient to the ICU on IV nitroglycerin drip. After initial angina free revealed only the mid LAD lesion, she did receive Angiomax bolus, but the drip was discontinued as the continued pictures demonstrated ostial LAD lesions. She did not receive oral antiplatelet agent.  Following the procedure, the catheter was removed completely out of the body over wire.  Radial Sheath(s) removed in the Cardiac Cath Lab with TR band placement for hemostasis.   TR Band: 0600 Hours; 14 mL air  MEDICATIONS * 75 g IV fentanyl, 1 mgIV Versed * SQ Lidocaine 5m * Radial Cocktail: 5 mg verapamil in 10 mL NS * Isovue Contrast: 155 mL * Heparin: 3500 Units * Angiomax bolus with drip discontinued after 5 minutes - initial angiography did not lay out the ostial LAD lesion. Once this was seen, Angiomax was discontinued. * IC NTG 200 g 1; IA NTG 200 mg 1 * NTG infusion initiated 5 mcg/h    Coronary Findings    Dominance: Right   Left Main  . Vessel is large.     Left Anterior Descending  . Vessel is moderate in size.   .Colon FlatteryLAD lesion, 90% stenosed. Discrete located at the major branch.   . Prox LAD lesion, 99% stenosed. Tubular eccentric located at the major branch.   . First Diagonal Branch   The vessel is small in size.   .Colon Flattery1st Diag to 1st Diag lesion, 100% stenosed. Cannot be certain of the branch size, but in some views, there is some faint filling of the distal vessel -  suggesting its presence   . First Septal Branch   The vessel is small in size.   .Marland KitchenSecond Septal Branch   The vessel is small in size.   . Third Septal Branch   The vessel is small in size.     Left Circumflex  . Vessel is large. Vessel is angiographically normal. The vessel is tortuous.   . First Obtuse Marginal Branch   The vessel is moderate in size.   .Colon Flattery1st Mrg to 1st Mrg lesion, 50% stenosed. Discrete located at the major branch.   . Second Obtuse Marginal Branch   The vessel is small in size and is angiographically normal.   . Third Obtuse Marginal Branch   The vessel is moderate in size and is angiographically normal.   . Lateral Third Obtuse Marginal Branch   The vessel is small in size.     Right Coronary Artery  . Vessel is moderate in size.   . Mid RCA lesion, 85% stenosed. Discrete tubular eccentric.   .Marland KitchenDist RCA lesion, 40% stenosed. Tubular eccentric.   .Marland KitchenAcute Marginal Branch   The vessel is small in size.   . Right Posterior Descending Artery   The vessel is small in size and exhibits minimal luminal irregularities.   . Right Posterior Atrioventricular Branch   The vessel is small in size.   .Marland KitchenPost Atrio lesion, 99% stenosed. Discrete eccentric located at the major branch.      Wall Motion                 Left Heart    Left Ventricle The left ventricular size is normal.  The left ventricular ejection fraction is 55-65% by visual estimate. There are wall motion abnormalities in the left ventricle. There are segmental wall motion abnormalities in the left ventricle.   Mitral Valve There is trivial (1+) mitral regurgitation.   Aortic Valve There is no aortic valve stenosis, and no aortic valve regurgitation.   Aorta The ascending aorta seem to be relatively normal, however the innominate artery off the subclavian on the right is somewhat tortuous making it very difficult to advance diagnostic catheters. Would recommend not performing right  radial access catheterization in the future.    Coronary Diagrams    Diagnostic Diagram            Implants     No implant documentation for this case.    PACS Images    Show images for Cardiac catheterization     Link to Procedure Log    Procedure Log      Hemo Data       Most Recent Value   AO Systolic Pressure  458 mmHg   AO Diastolic Pressure  64 mmHg   AO Mean  099 mmHg   LV Systolic Pressure  833 mmHg   LV Diastolic Pressure  20 mmHg   LV EDP  27 mmHg   Arterial Occlusion Pressure Extended Systolic Pressure  825 mmHg   Arterial Occlusion Pressure Extended Diastolic Pressure  60 mmHg   Arterial Occlusion Pressure Extended Mean Pressure  113 mmHg   Left Ventricular Apex Extended Systolic Pressure  053 mmHg   Left Ventricular Apex Extended Diastolic Pressure  12 mmHg   Left Ventricular Apex Extended EDP Pressure  21 mmHg    Assessment/Plan:  This 70 year old woman with a heavy ongoing smoking history and diabetes presents with an acute anterior MI with a tight ostial LAD and proximal to mid LAD stenosis as well as high grade RCA stenosis. She is currently free of chest pain. The ostial LAD lesion is no amenable to PCI and I agree that CABG is the best treatment for her. I discussed the operative procedure with the patient and family including alternatives, benefits and risks; including but not limited to bleeding, blood transfusion, infection, stroke, myocardial infarction, graft failure, heart block requiring a permanent pacemaker, organ dysfunction, and death.  Kerri Hicks understands and agrees to proceed.  We will proceed with surgery now.  Kerri Hicks 09/30/2015, 6:58 AM

## 2015-09-30 NOTE — Transfer of Care (Signed)
Immediate Anesthesia Transfer of Care Note  Patient: Kerri Hicks  Procedure(s) Performed: Procedure(s): CORONARY ARTERY BYPASS GRAFTING (CABG) TIMES TWO USING LEFT INTERNAL MAMMARY ARTERY AND ENDOSCOPICALLY HARVESTED RIGHT SAPHENOUS VEIN GRAFT (N/A)  Patient Location: SICU  Anesthesia Type:General  Level of Consciousness: Patient remains intubated per anesthesia plan  Airway & Oxygen Therapy: Patient placed on Ventilator (see vital sign flow sheet for setting)  Post-op Assessment: Post -op Vital signs reviewed and stable  Post vital signs: stable  Last Vitals:  Filed Vitals:   09/30/15 0643 09/30/15 0700  BP: 131/119 140/66  Pulse: 76 74  Resp: 17 18    Last Pain: There were no vitals filed for this visit.       Complications: No apparent anesthesia complications

## 2015-09-30 NOTE — Op Note (Signed)
CARDIOVASCULAR SURGERY OPERATIVE NOTE  09/30/2015  Surgeon:  Gaye Pollack, MD  First Assistant: Jadene Pierini,  PA-C   Preoperative Diagnosis:  Severe multi-vessel coronary artery disease   Postoperative Diagnosis:  Same   Procedure: Emergent  1. Median Sternotomy 2. Extracorporeal circulation 3.   Coronary artery bypass grafting x 2   Left internal mammary graft to the LAD  SVG to PDA   4.   Endoscopic vein harvest from the right leg   Anesthesia:  General Endotracheal   Clinical History/Surgical Indication:  The patient is a 70 year old heavy smoker with diabetes and a family history of CAD who woke up around MN with SSCP radiating to her shoulders. She had never had that before. She called EMS but by the time they got there it resolved so she declined transport to the hospital. She went back to sleep and it recurred around 1:30 am. She went to [facility] and her CP improved with SL NTG. She received heparin, 4 baby aspirin and morphine and was transferred her to cath lab. CP was 1-2/10 on arrival here and then resolved. Cath shows 90% ostial LAD and 99% proximal to mid LAD lesions. The LCX has mild irregularity. The RCA has 85% mid vessel disease and distal stenosis before the takeoff of the PDA. LVEF preserved with apical hypo and elevated LVEDP of 21. She currently has no pain. Troponin 0.55 at 4 am and 1.40 at 5:45 with MB of 21. ECG at 4 am showed anterior ST elevation.   This 70 year old woman with a heavy ongoing smoking history and diabetes presents with an acute anterior MI with a tight ostial LAD and proximal to mid LAD stenosis as well as high grade RCA stenosis. She is currently free of chest pain. The ostial LAD lesion is no amenable to PCI and I agree that CABG is the best treatment for her. I discussed the operative procedure with the patient and family including alternatives,  benefits and risks; including but not limited to bleeding, blood transfusion, infection, stroke, myocardial infarction, graft failure, heart block requiring a permanent pacemaker, organ dysfunction, and death. Roxanne Mins understands and agrees to proceed.   Preparation:  The patient was seen in the preoperative holding area and the correct patient, correct operation were confirmed with the patient after reviewing the medical record and catheterization. The consent was signed by me. Preoperative antibiotics were given. A pulmonary arterial line and radial arterial line were placed by the anesthesia team. The patient was taken back to the operating room and positioned supine on the operating room table. After being placed under general endotracheal anesthesia by the anesthesia team a foley catheter was placed. The neck, chest, abdomen, and both legs were prepped with betadine soap and solution and draped in the usual sterile manner. A surgical time-out was taken and the correct patient and operative procedure were confirmed with the nursing and anesthesia staff.   Cardiopulmonary Bypass:  A median sternotomy was performed. The pericardium was opened in the midline. Right ventricular function appeared normal. The ascending aorta was of normal size and had no palpable plaque. There were no contraindications to aortic cannulation or cross-clamping. The patient was fully systemically heparinized and the ACT was maintained > 400 sec. The proximal aortic arch was cannulated with a 20 F aortic cannula for arterial inflow. Venous cannulation was performed via the right atrial appendage using a two-staged venous cannula. An antegrade cardioplegia/vent cannula was inserted into the mid-ascending aorta.  Aortic occlusion was performed with a single cross-clamp. Systemic cooling to 32 degrees Centigrade and topical cooling of the heart with iced saline were used. Hyperkalemic antegrade cold blood cardioplegia was  used to induce diastolic arrest and was then given at about 20 minute intervals throughout the period of arrest to maintain myocardial temperature at or below 10 degrees centigrade. A temperature probe was inserted into the interventricular septum and an insulating pad was placed in the pericardium.   Left internal mammary harvest:  The left side of the sternum was retracted using the Rultract retractor. The left internal mammary artery was harvested as a pedicle graft. All side branches were clipped. It was a small-sized vessel of good quality with good blood flow. It was ligated distally and divided. It was sprayed with topical papaverine solution to prevent vasospasm.   Endoscopic vein harvest:  The right greater saphenous vein was harvested endoscopically through a 2 cm incision medial to the right knee. It was harvested from the upper thigh to below the knee. It was a medium-sized vein of good quality. The side branches were all ligated with 4-0 silk ties.    Coronary arteries:  The coronary arteries were examined.   LAD:  Intramyocardial in its proximal and mid portions. Distally the vessel had no disease. Small diagonal  LCX:  No significant disease  RCA:  PDA is small but graftable proximally. The PL is tiny and not graftable.   Grafts:  1. LIMA to the LAD: 1.6 mm. It was sewn end to side using 8-0 prolene continuous suture. 2. SVG to PDA:  1.6 mm. It was sewn end to side using 7-0 prolene continuous suture.    The proximal vein graft anastomosis was performed to the mid-ascending aorta using continuous 6-0 prolene suture. A graft marker were placed around the proximal anastomosis.   Completion:  The patient was rewarmed to 37 degrees Centigrade. The clamp was removed from the LIMA pedicle and there was rapid warming of the septum and return of ventricular fibrillation. The crossclamp was removed with a time of 42 minutes. There was spontaneous return of sinus rhythm. The  distal and proximal anastomoses were checked for hemostasis. The position of the grafts was satisfactory. Two temporary epicardial pacing wires were placed on the right atrium and two on the right ventricle. The patient was weaned from CPB without difficulty on no inotropes. TEE showed normal LV function. CPB time was 59 minutes. Cardiac output was 5 LPM. Heparin was fully reversed with protamine and the aortic and venous cannulas removed. Hemostasis was achieved. Mediastinal and left pleural drainage tubes were placed. The sternum was closed with double #6 stainless steel wires. The fascia was closed with continuous # 1 vicryl suture. The subcutaneous tissue was closed with 2-0 vicryl continuous suture. The skin was closed with 3-0 vicryl subcuticular suture. All sponge, needle, and instrument counts were reported correct at the end of the case. Dry sterile dressings were placed over the incisions and around the chest tubes which were connected to pleurevac suction. The patient was then transported to the surgical intensive care unit in critical but stable condition.

## 2015-09-30 NOTE — Progress Notes (Signed)
Withdrew ET tube 3 cm, per order.

## 2015-09-30 NOTE — ED Notes (Signed)
Pt placed on 2L nasal cannula.

## 2015-09-30 NOTE — Progress Notes (Addendum)
Patient ID: Kerri Hicks, female   DOB: 10/22/1945, 70 y.o.   MRN: [id number] EVENING ROUNDS NOTE :     [address]       [place] [address]             [phone]                 Day of Surgery Procedure(s) (LRB): CORONARY ARTERY BYPASS GRAFTING (CABG) TIMES TWO USING LEFT INTERNAL MAMMARY ARTERY AND ENDOSCOPICALLY HARVESTED RIGHT SAPHENOUS VEIN GRAFT (N/A)  Total Length of Stay:  LOS: 0 days  BP 107/54 mmHg  Pulse 75  Temp(Src) 98.2 F (36.8 C) (Core (Comment))  Resp 22  Ht 5\' 2"  (1.575 m)  Wt 178 lb 12.7 oz (81.1 kg)  BMI 32.69 kg/m2  SpO2 99%  .Intake/Output      06/12 0701 - 06/13 0700 06/13 0701 - 06/14 0700   I.V. (mL/kg) 0.7 (0) 3705.5 (45.7)   Blood  440   IV Piggyback  1400   Total Intake(mL/kg) 0.7 (0) 5545.5 (68.4)   Urine (mL/kg/hr) 375 2405 (2.6)   Blood  1415 (1.5)   Chest Tube  180 (0.2)   Total Output 375 4000   Net -374.3 +1545.5        Urine Occurrence 1 x      . sodium chloride 20 mL/hr at 09/30/15 1800  . [START ON 10/01/2015] sodium chloride    . sodium chloride 10 mL/hr at 09/30/15 1800  . dexmedetomidine Stopped (09/30/15 1610)  . insulin (NOVOLIN-R) infusion 1.3 Units/hr (09/30/15 1800)  . lactated ringers 20 mL/hr at 09/30/15 1800  . lactated ringers 20 mL/hr at 09/30/15 1800  . lidocaine 1 mg/min (09/30/15 1800)  . nitroGLYCERIN    . phenylephrine (NEO-SYNEPHRINE) Adult infusion 15 mcg/min (09/30/15 1800)     Lab Results  Component Value Date   WBC 16.5* 09/30/2015   HGB 8.9* 09/30/2015   HCT 27.8* 09/30/2015   PLT 221 09/30/2015   GLUCOSE 143* 09/30/2015   CHOL 150 09/30/2015   TRIG 63 09/30/2015   HDL 33* 09/30/2015   LDLCALC 104* 09/30/2015   ALT 12* 09/30/2015   AST 18 09/30/2015   NA 139 09/30/2015   K 4.1 09/30/2015   CL 103 09/30/2015   CREATININE 0.60 09/30/2015   BUN 9 09/30/2015   CO2 23 09/30/2015   TSH 0.920 09/30/2015   INR 1.43 09/30/2015   Emergency cabg today , long term smoker,  slow to wean from vent, not bleeding Now on lidocaine for VT earlier, no recurrance, k 4.1 , given MAG 4 grams  Grace Isaac MD  Beeper [phone] Office [phone] 09/30/2015 6:32 PM

## 2015-09-30 NOTE — Brief Op Note (Signed)
09/30/2015       [address]       [place] [address]             [phone]      10:36 AM  PATIENT:  Kerri Hicks  70 y.o. female  PRE-OPERATIVE DIAGNOSIS:  Coronary artery disease  POST-OPERATIVE DIAGNOSIS:  Coronary artery disease  PROCEDURE:  Procedure(s): CORONARY ARTERY BYPASS GRAFTING (CABG)  SURGEON:  Surgeon(s): Gaye Pollack, MD  PHYSICIAN ASSISTANT: Latoya Diskin PA-C  ANESTHESIA:   general  PATIENT CONDITION:  ICU - intubated and hemodynamically stable.  PRE-OPERATIVE WEIGHT: 0000000  COMPLICATIONS: NO KNOWN

## 2015-09-30 NOTE — OR Nursing (Signed)
SICU charge nurse called at 1106 to inform of 45 minute call.

## 2015-09-30 NOTE — Interval H&P Note (Signed)
History and Physical Interval Note:  09/30/2015 6:09 AM  Kerri Hicks  has presented today for surgery, with the diagnosis of STEMI  The various methods of treatment have been discussed with the patient and family. After consideration of risks, benefits and other options for treatment, the patient has consented to  Procedure(s): Left Heart Cath and Coronary Angiography (N/A) as a surgical intervention .  The patient's history has been reviewed, patient examined, no change in status, stable for surgery.  I have reviewed the patient's chart and labs.  Questions were answered to the patient's satisfaction.     Glenetta Hew

## 2015-09-30 NOTE — H&P (Addendum)
Kerri Hicks is an 70 y.o. female.    Chief Complaint: chest pain Primary Cardiologist: new HPI: Kerri Hicks is a 70 yo woman with PMH of borderline diabetes, tobacco use (1ppd x 50 years), family history of CAD (Mother in mid 65s) who woke up with substernal chest pressure that radiated to her shoulders. She presented to [facility] and pain improved with SL NTG. She received 4000 units heparin, 4 baby aspirin, zofran, morphine and was transferred to [facility] for emergent cath lab. On arrival chest discomfort down to 1-2/10.   She endorses daily 1ppd smoking although she expresses desire to quit. She denies functional limitation at home - she says she can do everything she likes, lives at home with her husband and performs her ADLs. She only takes occasional tylenol or ibuprofen.   She denies weight loss/weight gain. No easy bruising, no bleeding, no dark stools and no hematuria.   She was brought emergently to the cath lab.   Her only allergies are to PCN which causes a rash.  Past Medical History  Diagnosis Date  . Diabetes mellitus without complication ([place])     History reviewed. No pertinent past surgical history.  History reviewed. No pertinent family history. Social History:  reports that she has been smoking Cigarettes.  She has a 50 pack-year smoking history. She does not have any smokeless tobacco history on file. Her alcohol and drug histories are not on file.  Allergies: PCN - rash  No prescriptions prior to admission    Results for orders placed or performed during the hospital encounter of 09/30/15 (from the past 48 hour(s))  CBC with Differential     Status: Abnormal   Collection Time: 09/30/15  4:13 AM  Result Value Ref Range   WBC 9.6 3.6 - 11.0 K/uL   RBC 4.44 3.80 - 5.20 MIL/uL   Hemoglobin 13.3 12.0 - 16.0 g/dL   HCT 39.3 35.0 - 47.0 %   MCV 88.3 80.0 - 100.0 fL   MCH 29.8 26.0 - 34.0 pg   MCHC 33.8 32.0 - 36.0 g/dL   RDW 15.1 (H) 11.5 - 14.5 %   Platelets  338 150 - 440 K/uL   Neutrophils Relative % 60 %   Neutro Abs 5.8 1.4 - 6.5 K/uL   Lymphocytes Relative 28 %   Lymphs Abs 2.7 1.0 - 3.6 K/uL   Monocytes Relative 7 %   Monocytes Absolute 0.7 0.2 - 0.9 K/uL   Eosinophils Relative 4 %   Eosinophils Absolute 0.4 0 - 0.7 K/uL   Basophils Relative 1 %   Basophils Absolute 0.1 0 - 0.1 K/uL  Comprehensive metabolic panel     Status: Abnormal   Collection Time: 09/30/15  4:13 AM  Result Value Ref Range   Sodium 136 135 - 145 mmol/L   Potassium 3.8 3.5 - 5.1 mmol/L   Chloride 103 101 - 111 mmol/L   CO2 24 22 - 32 mmol/L   Glucose, Bld 167 (H) 65 - 99 mg/dL   BUN 14 6 - 20 mg/dL   Creatinine, Ser 1.19 (H) 0.44 - 1.00 mg/dL   Calcium 9.1 8.9 - 10.3 mg/dL   Total Protein 8.0 6.5 - 8.1 g/dL   Albumin 4.4 3.5 - 5.0 g/dL   AST 18 15 - 41 U/L   ALT 14 14 - 54 U/L   Alkaline Phosphatase 125 38 - 126 U/L   Total Bilirubin 0.3 0.3 - 1.2 mg/dL   GFR calc non Af  Amer 46 (L) >60 mL/min   GFR calc Af Amer 53 (L) >60 mL/min    Comment: (NOTE) The eGFR has been calculated using the CKD EPI equation. This calculation has not been validated in all clinical situations. eGFR's persistently <60 mL/min signify possible Chronic Kidney Disease.    Anion gap 9 5 - 15  Troponin I     Status: Abnormal   Collection Time: 09/30/15  4:13 AM  Result Value Ref Range   Troponin I 0.55 (H) <0.031 ng/mL    Comment: READ BACK AND VERIFIED WITH KATE BUMGARNER AT [place] ON 09/30/15.Marland KitchenMarland KitchenEncompass Health Rehabilitation Hospital Of [facility]        POSSIBLE MYOCARDIAL ISCHEMIA. SERIAL TESTING RECOMMENDED.   Protime-INR     Status: None   Collection Time: 09/30/15  4:13 AM  Result Value Ref Range   Prothrombin Time 14.1 11.4 - 15.0 seconds   INR 1.07   APTT     Status: Abnormal   Collection Time: 09/30/15  4:13 AM  Result Value Ref Range   aPTT 37 (H) 24 - 36 seconds    Comment:        IF BASELINE aPTT IS ELEVATED, SUGGEST PATIENT RISK ASSESSMENT BE USED TO DETERMINE APPROPRIATE ANTICOAGULANT THERAPY.    Dg  Chest Port 1 View  09/30/2015  CLINICAL DATA:  Chest pain since midnight radiating down both arms. Diaphoresis and nausea. EXAM: PORTABLE CHEST 1 VIEW COMPARISON:  None. FINDINGS: The heart size and mediastinal contours are within normal limits. Both lungs are clear. The visualized skeletal structures are unremarkable. IMPRESSION: No active disease. Electronically Signed   By: Lucienne Capers M.D.   On: 09/30/2015 04:39    Review of Systems  Constitutional: Positive for diaphoresis. Negative for fever, chills, weight loss and malaise/fatigue.  HENT: Negative for ear discharge.   Eyes: Negative for double vision and photophobia.  Respiratory: Negative for cough, hemoptysis and sputum production.   Cardiovascular: Positive for chest pain. Negative for palpitations and orthopnea.  Gastrointestinal: Positive for nausea. Negative for abdominal pain, diarrhea, constipation, blood in stool and melena.  Genitourinary: Negative for dysuria, urgency and hematuria.  Musculoskeletal: Negative for myalgias and neck pain.  Skin: Negative for rash.  Neurological: Negative for dizziness, tingling, tremors and headaches.  Endo/Heme/Allergies: Negative for polydipsia. Does not bruise/bleed easily.  Psychiatric/Behavioral: Negative for depression and suicidal ideas.    SpO2 100 %. Physical Exam  Nursing note and vitals reviewed. Constitutional: She is oriented to person, place, and time. She appears well-developed and well-nourished. She appears distressed.  Appears stated age  HENT:  Head: Normocephalic and atraumatic.  Nose: Nose normal.  Mouth/Throat: Oropharynx is clear and moist. No oropharyngeal exudate.  Eyes: Conjunctivae and EOM are normal. Pupils are equal, round, and reactive to light. No scleral icterus.  Neck: Normal range of motion. No tracheal deviation present.  JVP 2 cm above clavicle  Cardiovascular: Normal rate, regular rhythm, normal heart sounds and intact distal pulses.  Exam  reveals no gallop.   No murmur heard. Respiratory: She has rales.  Rales at bases  GI: Soft. Bowel sounds are normal. She exhibits no distension. There is no tenderness. There is no rebound.  Musculoskeletal: Normal range of motion. She exhibits no edema.  Neurological: She is oriented to person, place, and time. No cranial nerve deficit.  Skin: Skin is warm and dry. No rash noted. She is not diaphoretic. No erythema.  Psychiatric: She has a normal mood and affect. Her behavior is normal. Thought content normal.  Labs reviewed at [facility] and many pending Cr 1.2 EKG: SR with anterior STEMI  Assessment/Plan Ms. Glade is a 70 yo woman with PMH of borderline diabetes, tobacco use (1ppd x 50 years), family history of CAD (Mother in mid 46s) who woke up with substernal chest pressure and found to have anterior STEMI on ECG. Risk factors include family history, age > 62, tobacco, diabetes and hypertension. She is currently having emergent cardiac catheterization with anticipated PCI.   1. STEMI: received aspirin 324 mg, loaded with ticagrelor 180 mg in cath lab, received 4000 units heparin in ER. - admit to CCU, telemetry, daily asa 81 mg, atorvastatin 80 mg qHS, metoprolol 12.5 mg bid - update TSH, BNP, Mg, hba1c, lipid panel - echocardiogram in AM  2. T2DM: obtain hba1c 3. Hypertension: metoprolol 12.5 mg bid with HR parameters 4. Dyslipidemia: atorvastatin 80 mg qHS 5. Tobacco abuse: daily smoking cessation counseling   Jules Husbands, MD 09/30/2015, 5:13 AM Addendum:   99% ostial and mid LAD noted with mid RCA disease Given ostial LAD, have consulted emergency CT Surgery on call and plan for likely CABG today.

## 2015-09-30 NOTE — Progress Notes (Signed)
Multiple runs of v-tach with longest run at 15 beats. Dr. Cyndia Bent updated and verbal order given for lidocaine gtt at 1mg  with a 75mg  bolus.

## 2015-09-30 NOTE — Progress Notes (Signed)
Dr. Cyndia Bent updated on patient. Radiology report of ET tube in right main bronchus, pt having 8 beat run of v-tach, and critical lab value troponin 50.80.  Order given to withdraw ET tube 2-3 cm and MD saw pt at the bedside. Will continue to monitor.

## 2015-09-30 NOTE — Anesthesia Procedure Notes (Addendum)
Procedure Name: Intubation Date/Time: 09/30/2015 8:17 AM Performed by: Lavell Luster Pre-anesthesia Checklist: Patient identified, Emergency Drugs available, Suction available, Patient being monitored and Timeout performed Patient Re-evaluated:Patient Re-evaluated prior to inductionOxygen Delivery Method: Circle system utilized Preoxygenation: Pre-oxygenation with 100% oxygen Intubation Type: IV induction and Rapid sequence Laryngoscope Size: Mac, 3 and Glidescope Grade View: Grade II Tube type: Oral Tube size: 7.5 mm Number of attempts: 1 Airway Equipment and Method: Video-laryngoscopy and Stylet Placement Confirmation: ETT inserted through vocal cords under direct vision,  positive ETCO2 and breath sounds checked- equal and bilateral Secured at: 22 cm Tube secured with: Tape Dental Injury: Teeth and Oropharynx as per pre-operative assessment  Difficulty Due To: Difficulty was anticipated, Difficult Airway- due to limited oral opening, Difficult Airway- due to dentition and Difficult Airway- due to anterior larynx Future Recommendations: Recommend- induction with short-acting agent, and alternative techniques readily available Comments: Easy glidescope RSI intubation after quick look with MAC 3 blade.  Grade III view noted.  Henderson Cloud, CRNA   Procedures: Right Sander Radon Catheter Insertion: [id number]: The patient was identified and consent obtained.  TO was performed, and full barrier precautions were used.  The skin was anesthetized with lidocaine-4cc plain with 25g needle.  Once the vein was located with the 22 ga. needle using ultrasound guidance , the wire was inserted into the vein.  The wire location was confirmed with ultrasound.  The tissue was dilated and the 8.5 [place] cordis catheter was carefully inserted. Afterwards Gordy Councilman catheter was inserted. PA catheter at 45cm.  The patient tolerated the procedure well.   CE

## 2015-09-30 NOTE — ED Notes (Signed)
Pt e-signature pad not working to get pt to sign for transfer to [facility] Cath lab. Pt verbalized understanding for transfer.

## 2015-09-30 NOTE — ED Notes (Signed)
Report given to Cath lab.

## 2015-09-30 NOTE — Anesthesia Preprocedure Evaluation (Signed)
Anesthesia Evaluation  Patient identified by MRN, date of birth, ID band Patient awake    Reviewed: Allergy & Precautions, NPO status   Airway Mallampati: II  TM Distance: >3 FB Neck ROM: Full    Dental   Pulmonary Current Smoker,    breath sounds clear to auscultation       Cardiovascular hypertension, + Past MI   Rhythm:Regular Rate:Normal     Neuro/Psych    GI/Hepatic negative GI ROS, Neg liver ROS,   Endo/Other  diabetes  Renal/GU negative Renal ROS     Musculoskeletal   Abdominal   Peds  Hematology   Anesthesia Other Findings   Reproductive/Obstetrics                             Anesthesia Physical Anesthesia Plan  ASA: IV  Anesthesia Plan: General   Post-op Pain Management:    Induction: Intravenous  Airway Management Planned: Oral ETT  Additional Equipment: Arterial line, PA Cath, TEE and Ultrasound Guidance Line Placement  Intra-op Plan:   Post-operative Plan: Post-operative intubation/ventilation  Informed Consent: I have reviewed the patients History and Physical, chart, labs and discussed the procedure including the risks, benefits and alternatives for the proposed anesthesia with the patient or authorized representative who has indicated his/her understanding and acceptance.   Dental advisory given  Plan Discussed with: CRNA and Anesthesiologist  Anesthesia Plan Comments:         Anesthesia Quick Evaluation

## 2015-10-01 ENCOUNTER — Other Ambulatory Visit (HOSPITAL_COMMUNITY): Payer: Self-pay

## 2015-10-01 ENCOUNTER — Encounter (HOSPITAL_COMMUNITY): Payer: Self-pay | Admitting: Surgery

## 2015-10-01 ENCOUNTER — Inpatient Hospital Stay (HOSPITAL_COMMUNITY): Payer: Commercial Managed Care - HMO

## 2015-10-01 LAB — CBC
HCT: 27.8 % — ABNORMAL LOW (ref 36.0–46.0)
HCT: 25.4 % — ABNORMAL LOW (ref 36.0–46.0)
Hemoglobin: 8.7 g/dL — ABNORMAL LOW (ref 12.0–15.0)
Hemoglobin: 7.8 g/dL — ABNORMAL LOW (ref 12.0–15.0)
MCH: 29 pg (ref 26.0–34.0)
MCH: 28.3 pg (ref 26.0–34.0)
MCHC: 31.3 g/dL (ref 30.0–36.0)
MCHC: 30.7 g/dL (ref 30.0–36.0)
MCV: 92.7 fL (ref 78.0–100.0)
MCV: 92 fL (ref 78.0–100.0)
PLATELETS: 257 10*3/uL (ref 150–400)
PLATELETS: 179 10*3/uL (ref 150–400)
RBC: 3 MIL/uL — AB (ref 3.87–5.11)
RBC: 2.76 MIL/uL — ABNORMAL LOW (ref 3.87–5.11)
RDW: 15.5 % (ref 11.5–15.5)
RDW: 15.8 % — AB (ref 11.5–15.5)
WBC: 19.6 10*3/uL — AB (ref 4.0–10.5)
WBC: 14.7 10*3/uL — AB (ref 4.0–10.5)

## 2015-10-01 LAB — MAGNESIUM
MAGNESIUM: 2.9 mg/dL — AB (ref 1.7–2.4)
Magnesium: 2.3 mg/dL (ref 1.7–2.4)

## 2015-10-01 LAB — GLUCOSE, CAPILLARY
GLUCOSE-CAPILLARY: 104 mg/dL — AB (ref 65–99)
GLUCOSE-CAPILLARY: 104 mg/dL — AB (ref 65–99)
GLUCOSE-CAPILLARY: 104 mg/dL — AB (ref 65–99)
GLUCOSE-CAPILLARY: 105 mg/dL — AB (ref 65–99)
GLUCOSE-CAPILLARY: 114 mg/dL — AB (ref 65–99)
GLUCOSE-CAPILLARY: 115 mg/dL — AB (ref 65–99)
GLUCOSE-CAPILLARY: 137 mg/dL — AB (ref 65–99)
GLUCOSE-CAPILLARY: 139 mg/dL — AB (ref 65–99)
Glucose-Capillary: 155 mg/dL — ABNORMAL HIGH (ref 65–99)
Glucose-Capillary: 143 mg/dL — ABNORMAL HIGH (ref 65–99)
Glucose-Capillary: 89 mg/dL (ref 65–99)
Glucose-Capillary: 96 mg/dL (ref 65–99)
Glucose-Capillary: 100 mg/dL — ABNORMAL HIGH (ref 65–99)
Glucose-Capillary: 109 mg/dL — ABNORMAL HIGH (ref 65–99)
Glucose-Capillary: 48 mg/dL — ABNORMAL LOW (ref 65–99)
Glucose-Capillary: 89 mg/dL (ref 65–99)
Glucose-Capillary: 125 mg/dL — ABNORMAL HIGH (ref 65–99)
Glucose-Capillary: 123 mg/dL — ABNORMAL HIGH (ref 65–99)
Glucose-Capillary: 101 mg/dL — ABNORMAL HIGH (ref 65–99)
Glucose-Capillary: 135 mg/dL — ABNORMAL HIGH (ref 65–99)
Glucose-Capillary: 145 mg/dL — ABNORMAL HIGH (ref 65–99)

## 2015-10-01 LAB — BASIC METABOLIC PANEL
ANION GAP: 3 — AB (ref 5–15)
BUN: 7 mg/dL (ref 6–20)
CALCIUM: 7.8 mg/dL — AB (ref 8.9–10.3)
CO2: 24 mmol/L (ref 22–32)
Chloride: 110 mmol/L (ref 101–111)
Creatinine, Ser: 0.96 mg/dL (ref 0.44–1.00)
GFR, EST NON AFRICAN AMERICAN: 59 mL/min — AB (ref >60)
Glucose, Bld: 105 mg/dL — ABNORMAL HIGH (ref 65–99)
POTASSIUM: 4.3 mmol/L (ref 3.5–5.1)
Sodium: 137 mmol/L (ref 135–145)

## 2015-10-01 LAB — CREATININE, SERUM
Creatinine, Ser: 0.74 mg/dL (ref 0.44–1.00)
GFR calc non Af Amer: >60 mL/min (ref >60)

## 2015-10-01 LAB — POCT I-STAT, CHEM 8
BUN: 7 mg/dL (ref 6–20)
CREATININE: 0.8 mg/dL (ref 0.44–1.00)
Calcium, Ion: 1.2 mmol/L (ref 1.13–1.30)
Chloride: 101 mmol/L (ref 101–111)
Glucose, Bld: 135 mg/dL — ABNORMAL HIGH (ref 65–99)
HEMATOCRIT: 26 % — AB (ref 36.0–46.0)
HEMOGLOBIN: 8.8 g/dL — AB (ref 12.0–15.0)
Potassium: 4.2 mmol/L (ref 3.5–5.1)
SODIUM: 137 mmol/L (ref 135–145)
TCO2: 24 mmol/L (ref 0–100)

## 2015-10-01 LAB — HEMOGLOBIN A1C
Hgb A1c MFr Bld: 6.1 % — ABNORMAL HIGH (ref 4.8–5.6)
Mean Plasma Glucose: 128 mg/dL

## 2015-10-01 MED ORDER — ALBUMIN HUMAN 25 % IV SOLN
12.5000 g | Freq: Once | INTRAVENOUS | Status: AC
  Administered 2015-10-01: 12.5 g via INTRAVENOUS
  Filled 2015-10-01: qty 50

## 2015-10-01 MED ORDER — INSULIN ASPART 100 UNIT/ML ~~LOC~~ SOLN
0.0000 [IU] | SUBCUTANEOUS | Status: DC
  Administered 2015-10-01 – 2015-10-02 (×8): 2 [IU] via SUBCUTANEOUS

## 2015-10-01 MED ORDER — AMIODARONE HCL IN DEXTROSE 360-4.14 MG/200ML-% IV SOLN
60.0000 mg/h | INTRAVENOUS | Status: AC
  Administered 2015-10-01: 60 mg/h via INTRAVENOUS
  Filled 2015-10-01: qty 200

## 2015-10-01 MED ORDER — FUROSEMIDE 10 MG/ML IJ SOLN
20.0000 mg | Freq: Once | INTRAMUSCULAR | Status: AC
  Administered 2015-10-01: 20 mg via INTRAVENOUS
  Filled 2015-10-01: qty 2

## 2015-10-01 MED ORDER — AMIODARONE HCL IN DEXTROSE 360-4.14 MG/200ML-% IV SOLN
INTRAVENOUS | Status: AC
  Filled 2015-10-01: qty 200

## 2015-10-01 MED ORDER — ENOXAPARIN SODIUM 40 MG/0.4ML ~~LOC~~ SOLN
40.0000 mg | Freq: Every day | SUBCUTANEOUS | Status: DC
  Administered 2015-10-01 – 2015-10-04 (×4): 40 mg via SUBCUTANEOUS
  Filled 2015-10-01 (×4): qty 0.4

## 2015-10-01 MED ORDER — METOCLOPRAMIDE HCL 5 MG/ML IJ SOLN
5.0000 mg | Freq: Four times a day (QID) | INTRAMUSCULAR | Status: AC
  Administered 2015-10-01 – 2015-10-02 (×4): 5 mg via INTRAVENOUS
  Filled 2015-10-01: qty 2
  Filled 2015-10-01: qty 1
  Filled 2015-10-01 (×3): qty 2

## 2015-10-01 MED ORDER — AMIODARONE HCL IN DEXTROSE 360-4.14 MG/200ML-% IV SOLN
30.0000 mg/h | INTRAVENOUS | Status: DC
  Administered 2015-10-02 – 2015-10-03 (×3): 30 mg/h via INTRAVENOUS
  Filled 2015-10-01 (×3): qty 200

## 2015-10-01 MED ORDER — INSULIN DETEMIR 100 UNIT/ML ~~LOC~~ SOLN: 20.0000 [IU] | Freq: Every day | SUBCUTANEOUS | Status: DC

## 2015-10-01 MED ORDER — INSULIN DETEMIR 100 UNIT/ML ~~LOC~~ SOLN
20.0000 [IU] | Freq: Every day | SUBCUTANEOUS | Status: DC
  Administered 2015-10-01 – 2015-10-02 (×2): 20 [IU] via SUBCUTANEOUS
  Filled 2015-10-01 (×3): qty 0.2

## 2015-10-01 MED ORDER — CETYLPYRIDINIUM CHLORIDE 0.05 % MT LIQD
7.0000 mL | Freq: Two times a day (BID) | OROMUCOSAL | Status: DC
  Administered 2015-10-02 – 2015-10-03 (×3): 7 mL via OROMUCOSAL

## 2015-10-01 MED ORDER — AMIODARONE LOAD VIA INFUSION
150.0000 mg | Freq: Once | INTRAVENOUS | Status: AC
Start: 2015-10-01 — End: 2015-10-01
  Administered 2015-10-01: 150 mg via INTRAVENOUS
  Filled 2015-10-01: qty 83.34

## 2015-10-01 MED ORDER — FE FUMARATE-B12-VIT C-FA-IFC PO CAPS
1.0000 | ORAL_CAPSULE | Freq: Two times a day (BID) | ORAL | Status: DC
  Administered 2015-10-01 – 2015-10-05 (×8): 1 via ORAL
  Filled 2015-10-01 (×8): qty 1

## 2015-10-01 MED ORDER — CHLORHEXIDINE GLUCONATE 0.12 % MT SOLN
OROMUCOSAL | Status: AC
  Filled 2015-10-01: qty 15

## 2015-10-01 NOTE — Progress Notes (Signed)
1 Day Post-Op Procedure(s) (LRB): CORONARY ARTERY BYPASS GRAFTING (CABG) TIMES TWO USING LEFT INTERNAL MAMMARY ARTERY AND ENDOSCOPICALLY HARVESTED RIGHT SAPHENOUS VEIN GRAFT (N/A) Subjective:  Complains of chest wall pain  Objective: Vital signs in last 24 hours: Temp:  [95.9 F (35.5 C)-99.1 F (37.3 C)] 99.1 F (37.3 C) (06/14 0700) Pulse Rate:  [70-96] 88 (06/14 0700) Cardiac Rhythm:  [-] Normal sinus rhythm (06/14 0400) Resp:  [11-28] 15 (06/14 0700) BP: (87-124)/(46-73) 105/61 mmHg (06/14 0700) SpO2:  [90 %-100 %] 98 % (06/14 0700) Arterial Line BP: (54-315)/(42-311) 79/45 mmHg (06/14 0700) FiO2 (%):  [40 %-50 %] 40 % (06/13 2110) Weight:  [85.73 kg (189 lb)] 85.73 kg (189 lb) (06/14 0500)  Hemodynamic parameters for last 24 hours: PAP: (21-71)/(10-27) 35/18 mmHg CO:  [3 L/min-5.1 L/min] 5.1 L/min CI:  [1.6 L/min/m2-2.8 L/min/m2] 2.8 L/min/m2  Intake/Output from previous day: 06/13 0701 - 06/14 0700 In: 6586.5 [I.V.:4546.5; Blood:440; IV [id number] Out: [id number] [Urine:3080; Blood:1415; Chest Tube:440] Intake/Output this shift:    General appearance: alert and cooperative Neurologic: intact Heart: regular rate and rhythm, S1, S2 normal, no murmur, click, rub or gallop Lungs: wheezes bilaterally Extremities: edema mild Wound: dressings dry  Lab Results:  Recent Labs  09/30/15 1745 09/30/15 1749 10/01/15 0414  WBC 16.5*  --  19.6*  HGB 8.9* 10.2* 8.7*  HCT 27.8* 30.0* 27.8*  PLT 221  --  257   BMET:  Recent Labs  09/30/15 0545  09/30/15 1749 10/01/15 0414  NA 129*  < > 140 137  K 3.9  < > 4.1 4.3  CL 100*  < > 110 110  CO2 23  --   --  24  GLUCOSE 167*  < > 98 105*  BUN 11  < > 6 7  CREATININE 0.95  < > 0.70 0.96  CALCIUM 8.1*  --   --  7.8*  < > = values in this interval not displayed.  PT/INR:  Recent Labs  09/30/15 1230  LABPROT 17.6*  INR 1.43   ABG    Component Value Date/Time   PHART 7.274* 09/30/2015 2257   HCO3 21.6 09/30/2015  2257   TCO2 23 09/30/2015 2257   ACIDBASEDEF 5.0* 09/30/2015 2257   O2SAT 97.0 09/30/2015 2257   CBG (last 3)   Recent Labs  10/01/15 0506 10/01/15 0604 10/01/15 0701  GLUCAP 104* 109* 104*   CXR: ok  ECG: sinus, anterolateral T-wave abnormality.  Assessment/Plan: S/P Procedure(s) (LRB): CORONARY ARTERY BYPASS GRAFTING (CABG) TIMES TWO USING LEFT INTERNAL MAMMARY ARTERY AND ENDOSCOPICALLY HARVESTED RIGHT SAPHENOUS VEIN GRAFT (N/A)  Hemodynamically stable s/p anterior STEMI and emergent CABG. Peak troponin 50 but LV function looked good in the OR by TEE. She is still on some neo so will hold beta blocker for now. Few brief runs of NSVT postop but lidocaine is off and none overnight  Mobilize Diuresis Diabetes control: preop Hgb A1c was 6.1 on no meds. Start Levemir and SSI. Not on a diabetic diet per family. d/c tubes/lines Continue foley due to patient in ICU and urinary output monitoring See progression orders   LOS: 1 day    Gaye Pollack 10/01/2015

## 2015-10-01 NOTE — Plan of Care (Signed)
Problem: Cardiac: Goal: Hemodynamic stability will improve Outcome: Progressing Off phenylephrine. Had atrial fib with RVR for approx 1 hour 15 minutes today. Was bolused with amiodarone and drip started. Converted to NSR around 1456.

## 2015-10-01 NOTE — Progress Notes (Signed)
   10/01/15 0952  Vitals  Temp 99.1 F (37.3 C)  Pulse Rate 90  ECG Heart Rate 98  Cardiac Rhythm NSR  Ectopy Couplet PVC's;Ventricular tachycardia, non-sustained (5 bt runs)  Ectopy Frequency Frequent  New onset of dysrhythmia? No  Resp 19  Oxygen Therapy  SpO2 97 %  SpO2 Alarm Limit Low (!) 90  Pre-WUA / WUA Start  [place] Agitation Sedation Scale (RASS) 0  RASS Goal 0  Art Line  Arterial Line BP 109/98 mmHg  Arterial Line MAP (mmHg) 95 mmHg  Invasive Hemodynamic Monitoring  PAP 28/10 mmHg  PAP (Mean) 17 mmHg  Pain Assessment  Pain Assessment No/denies pain  Patient's monitor shows runs VT 3-5 beats nonsustained. Dr. Cyndia Bent notified. SGC pulled

## 2015-10-01 NOTE — Progress Notes (Signed)
CT surgery p.m. Rounds  Sitting up in chair after being mobilize out of bed earlier today Patient on IV amiodarone for rapid atrial fibrillation which converted to sinus rhythm P.m. labs show drop in hemoglobin to 7.8 and oral iron sulfate started Patient appears comfortable with good breath sounds

## 2015-10-02 ENCOUNTER — Inpatient Hospital Stay (HOSPITAL_COMMUNITY): Payer: Commercial Managed Care - HMO

## 2015-10-02 LAB — GLUCOSE, CAPILLARY
GLUCOSE-CAPILLARY: 123 mg/dL — AB (ref 65–99)
GLUCOSE-CAPILLARY: 134 mg/dL — AB (ref 65–99)
GLUCOSE-CAPILLARY: 138 mg/dL — AB (ref 65–99)
GLUCOSE-CAPILLARY: 97 mg/dL (ref 65–99)
Glucose-Capillary: 136 mg/dL — ABNORMAL HIGH (ref 65–99)
Glucose-Capillary: 121 mg/dL — ABNORMAL HIGH (ref 65–99)

## 2015-10-02 LAB — CBC
HEMATOCRIT: 23.7 % — AB (ref 36.0–46.0)
Hemoglobin: 7.5 g/dL — ABNORMAL LOW (ref 12.0–15.0)
MCH: 29 pg (ref 26.0–34.0)
MCHC: 31.6 g/dL (ref 30.0–36.0)
MCV: 91.5 fL (ref 78.0–100.0)
PLATELETS: 161 10*3/uL (ref 150–400)
RBC: 2.59 MIL/uL — AB (ref 3.87–5.11)
RDW: 15.7 % — ABNORMAL HIGH (ref 11.5–15.5)
WBC: 14.6 10*3/uL — AB (ref 4.0–10.5)

## 2015-10-02 LAB — POCT I-STAT 4, (NA,K, GLUC, HGB,HCT)
Glucose, Bld: 125 mg/dL — ABNORMAL HIGH (ref 65–99)
HCT: 25 % — ABNORMAL LOW (ref 36.0–46.0)
Hemoglobin: 8.5 g/dL — ABNORMAL LOW (ref 12.0–15.0)
Potassium: 3.8 mmol/L (ref 3.5–5.1)
Sodium: 137 mmol/L (ref 135–145)

## 2015-10-02 LAB — BASIC METABOLIC PANEL
ANION GAP: 5 (ref 5–15)
BUN: 6 mg/dL (ref 6–20)
CHLORIDE: 105 mmol/L (ref 101–111)
CO2: 26 mmol/L (ref 22–32)
Calcium: 8 mg/dL — ABNORMAL LOW (ref 8.9–10.3)
Creatinine, Ser: 0.88 mg/dL (ref 0.44–1.00)
GFR calc non Af Amer: >60 mL/min (ref >60)
Glucose, Bld: 134 mg/dL — ABNORMAL HIGH (ref 65–99)
POTASSIUM: 4.3 mmol/L (ref 3.5–5.1)
SODIUM: 136 mmol/L (ref 135–145)

## 2015-10-02 LAB — POCT I-STAT, CHEM 8
BUN: 8 mg/dL (ref 6–20)
Calcium, Ion: 1.11 mmol/L — ABNORMAL LOW (ref 1.13–1.30)
Chloride: 103 mmol/L (ref 101–111)
Creatinine, Ser: 1 mg/dL (ref 0.44–1.00)
Glucose, Bld: 126 mg/dL — ABNORMAL HIGH (ref 65–99)
HCT: 26 % — ABNORMAL LOW (ref 36.0–46.0)
Hemoglobin: 8.8 g/dL — ABNORMAL LOW (ref 12.0–15.0)
Potassium: 3.2 mmol/L — ABNORMAL LOW (ref 3.5–5.1)
Sodium: 141 mmol/L (ref 135–145)
TCO2: 26 mmol/L (ref 0–100)

## 2015-10-02 LAB — TROPONIN I: TROPONIN I: 13.52 ng/mL — AB (ref <0.031)

## 2015-10-02 LAB — PREPARE RBC (CROSSMATCH)

## 2015-10-02 MED ORDER — FUROSEMIDE 10 MG/ML IJ SOLN
40.0000 mg | Freq: Once | INTRAMUSCULAR | Status: AC
  Administered 2015-10-02: 40 mg via INTRAVENOUS
  Filled 2015-10-02: qty 4

## 2015-10-02 MED ORDER — LEVALBUTEROL HCL 0.63 MG/3ML IN NEBU
0.6300 mg | INHALATION_SOLUTION | Freq: Three times a day (TID) | RESPIRATORY_TRACT | Status: DC
  Administered 2015-10-02 – 2015-10-03 (×4): 0.63 mg via RESPIRATORY_TRACT
  Filled 2015-10-02 (×4): qty 3

## 2015-10-02 MED ORDER — POTASSIUM CHLORIDE 10 MEQ/50ML IV SOLN
10.0000 meq | INTRAVENOUS | Status: AC | PRN
  Administered 2015-10-02 (×3): 10 meq via INTRAVENOUS

## 2015-10-02 MED ORDER — SODIUM CHLORIDE 0.9 % IV SOLN
Freq: Once | INTRAVENOUS | Status: AC
  Administered 2015-10-02: 10 mL/h via INTRAVENOUS

## 2015-10-02 MED ORDER — LIDOCAINE IN D5W 4-5 MG/ML-% IV SOLN
1.0000 mg/min | INTRAVENOUS | Status: DC
  Administered 2015-10-02: 1 mg/min via INTRAVENOUS
  Filled 2015-10-02: qty 500

## 2015-10-02 MED ORDER — POTASSIUM CHLORIDE CRYS ER 20 MEQ PO TBCR
40.0000 meq | EXTENDED_RELEASE_TABLET | Freq: Once | ORAL | Status: AC
  Administered 2015-10-02: 40 meq via ORAL
  Filled 2015-10-02: qty 2

## 2015-10-02 MED ORDER — LIDOCAINE BOLUS VIA INFUSION
100.0000 mg | Freq: Once | INTRAVENOUS | Status: AC
  Administered 2015-10-02: 100 mg via INTRAVENOUS
  Filled 2015-10-02: qty 100

## 2015-10-02 MED ORDER — METOPROLOL TARTRATE 12.5 MG HALF TABLET
12.5000 mg | ORAL_TABLET | Freq: Two times a day (BID) | ORAL | Status: DC
  Administered 2015-10-02 – 2015-10-03 (×4): 12.5 mg via ORAL
  Filled 2015-10-02 (×4): qty 1

## 2015-10-02 MED ORDER — POTASSIUM CHLORIDE CRYS ER 20 MEQ PO TBCR
20.0000 meq | EXTENDED_RELEASE_TABLET | Freq: Once | ORAL | Status: AC
  Administered 2015-10-02: 20 meq via ORAL
  Filled 2015-10-02: qty 1

## 2015-10-02 NOTE — Progress Notes (Signed)
  Amiodarone Drug - Drug Interaction Consult Note  Recommendations: No significant drug interaction identified.   Amiodarone is metabolized by the cytochrome P450 system and therefore has the potential to cause many drug interactions. Amiodarone has an average plasma half-life of 50 days (range 20 to 100 days).   There is potential for drug interactions to occur several weeks or months after stopping treatment and the onset of drug interactions may be slow after initiating amiodarone.   [x]  Statins: Increased risk of myopathy. Simvastatin- restrict dose to 20mg  daily. Other statins: counsel patients to report any muscle pain or weakness immediately.  Pt is on lipitor 80 mg  []  Anticoagulants: Amiodarone can increase anticoagulant effect. Consider warfarin dose reduction. Patients should be monitored closely and the dose of anticoagulant altered accordingly, remembering that amiodarone levels take several weeks to stabilize.  []  Antiepileptics: Amiodarone can increase plasma concentration of phenytoin, the dose should be reduced. Note that small changes in phenytoin dose can result in large changes in levels. Monitor patient and counsel on signs of toxicity.  []  Beta blockers: increased risk of bradycardia, AV block and myocardial depression. Sotalol - avoid concomitant use.  []   Calcium channel blockers (diltiazem and verapamil): increased risk of bradycardia, AV block and myocardial depression.  []   Cyclosporine: Amiodarone increases levels of cyclosporine. Reduced dose of cyclosporine is recommended.  []  Digoxin dose should be halved when amiodarone is started.  []  Diuretics: increased risk of cardiotoxicity if hypokalemia occurs.  []  Oral hypoglycemic agents (glyburide, glipizide, glimepiride): increased risk of hypoglycemia. Patient's glucose levels should be monitored closely when initiating amiodarone therapy.   []  Drugs that prolong the QT interval:  Torsades de pointes risk may  be increased with concurrent use - avoid if possible.  Monitor QTc, also keep magnesium/potassium WNL if concurrent therapy can't be avoided. Marland Kitchen Antibiotics: e.g. fluoroquinolones, erythromycin. . Antiarrhythmics: e.g. quinidine, procainamide, disopyramide, sotalol. . Antipsychotics: e.g. phenothiazines, haloperidol.  . Lithium, tricyclic antidepressants, and methadone. Thank You,  Manley Mason  10/02/2015 12:16 PM

## 2015-10-02 NOTE — Progress Notes (Addendum)
Pt began having persistent runs of V-Tach and frequent PVC's. Dr. Prescott Gum paged and updated on situation. 100 mg Lidocaine bolus ordered with a subsequent drip rate of 60 mg/hr. Potassium replaced per protocol.  Inis [place]

## 2015-10-02 NOTE — Progress Notes (Signed)
TCTS BRIEF SICU PROGRESS NOTE  2 Days Post-Op  S/P Procedure(s) (LRB): CORONARY ARTERY BYPASS GRAFTING (CABG) TIMES TWO USING LEFT INTERNAL MAMMARY ARTERY AND ENDOSCOPICALLY HARVESTED RIGHT SAPHENOUS VEIN GRAFT (N/A)   Stable day Maintaining NSR w/ stable BP O2 sats 96% on 3 L/min UOP adequate  Plan: Continue current plan  Rexene Alberts, MD 10/02/2015 8:12 PM

## 2015-10-02 NOTE — Progress Notes (Signed)
2 Days Post-Op Procedure(s) (LRB): CORONARY ARTERY BYPASS GRAFTING (CABG) TIMES TWO USING LEFT INTERNAL MAMMARY ARTERY AND ENDOSCOPICALLY HARVESTED RIGHT SAPHENOUS VEIN GRAFT (N/A) Subjective:  No complaints. Feels better today. Ambulated well this am  Had frequent ectopy overnight and started on lidocaine in addition to amio with resolution.  Objective: Vital signs in last 24 hours: Temp:  [97.8 F (36.6 C)-99.1 F (37.3 C)] 98.6 F (37 C) (06/15 0403) Pulse Rate:  [68-133] 78 (06/15 0700) Cardiac Rhythm:  [-] Normal sinus rhythm (06/15 0400) Resp:  [13-23] 14 (06/15 0700) BP: (97-146)/(42-66) 118/42 mmHg (06/15 0700) SpO2:  [92 %-99 %] 96 % (06/15 0700) Arterial Line BP: (68-109)/(48-98) 68/48 mmHg (06/14 1000) Weight:  [86.047 kg (189 lb 11.2 oz)] 86.047 kg (189 lb 11.2 oz) (06/15 0500)  Hemodynamic parameters for last 24 hours: PAP: (28-33)/(10-16) 31/16 mmHg  Intake/Output from previous day: 06/14 0701 - 06/15 0700 In: 2704.6 [P.O.:960; I.V.:1364.6; IV Piggyback:350] Out: 2310 [Urine:2150; Emesis/NG output:100; Chest Tube:60] Intake/Output this shift:    General appearance: alert and cooperative Neurologic: intact Heart: regular rate and rhythm, S1, S2 normal, no murmur, click, rub or gallop Lungs: clear to auscultation bilaterally Extremities: edema mild Wound: dressings dry  Lab Results:  Recent Labs  10/01/15 1720  10/02/15 0118 10/02/15 0524  WBC 14.7*  --   --  14.6*  HGB 7.8*  < > 8.5* 7.5*  HCT 25.4*  < > 25.0* 23.7*  PLT 179  --   --  161  < > = values in this interval not displayed. BMET:  Recent Labs  10/01/15 0414  10/01/15 1722 10/02/15 0118 10/02/15 0524  NA 137  --  137 137 136  K 4.3  --  4.2 3.8 4.3  CL 110  --  101  --  105  CO2 24  --   --   --  26  GLUCOSE 105*  --  135* 125* 134*  BUN 7  --  7  --  6  CREATININE 0.96  < > 0.80  --  0.88  CALCIUM 7.8*  --   --   --  8.0*  < > = values in this interval not displayed.  PT/INR:   Recent Labs  09/30/15 1230  LABPROT 17.6*  INR 1.43   ABG    Component Value Date/Time   PHART 7.274* 09/30/2015 2257   HCO3 21.6 09/30/2015 2257   TCO2 24 10/01/2015 1722   ACIDBASEDEF 5.0* 09/30/2015 2257   O2SAT 97.0 09/30/2015 2257   CBG (last 3)   Recent Labs  10/01/15 1922 10/01/15 2347 10/02/15 0349  GLUCAP 145* 137* 136*    Assessment/Plan: S/P Procedure(s) (LRB): CORONARY ARTERY BYPASS GRAFTING (CABG) TIMES TWO USING LEFT INTERNAL MAMMARY ARTERY AND ENDOSCOPICALLY HARVESTED RIGHT SAPHENOUS VEIN GRAFT (N/A)  She has been hemodynamically stable in sinus rhythm overnight but with frequent ventricular ectopy. She had postop atrial fib yesterday converted with amio and IV amio still going. Ectopy could be due to MI. K+ and Mg+ are normal. Will resume low dose BB since BP is better. Will check ECG today. Continue IV amio.  Expected postop blood loss anemia: Hbg trending down a little further so will give her a unit of PRBC's.  Volume excess: diurese after transfusion  DM: glucose under good control on Levemir and SSI  Continue IS, ambulation     LOS: 2 days    Gaye Pollack 10/02/2015

## 2015-10-02 NOTE — Care Management Note (Signed)
Case Management Note  Patient Details  Name: Kerri Hicks MRN: [id number] Date of Birth: 1945/09/02  Subjective/Objective:    Pt lives with spouse who will provide 24/7 assistance as needed.  Other family members will also be available for support.                         Expected Discharge Plan:  [place]  Discharge planning Services  CM Consult  Status of Service:  In process, will continue to follow  Girard Cooter, RN 10/02/2015, 3:22 PM

## 2015-10-03 LAB — CBC
HCT: 25.2 % — ABNORMAL LOW (ref 36.0–46.0)
Hemoglobin: 8.1 g/dL — ABNORMAL LOW (ref 12.0–15.0)
MCH: 29.2 pg (ref 26.0–34.0)
MCHC: 32.1 g/dL (ref 30.0–36.0)
MCV: 91 fL (ref 78.0–100.0)
PLATELETS: 176 10*3/uL (ref 150–400)
RBC: 2.77 MIL/uL — AB (ref 3.87–5.11)
RDW: 15.6 % — ABNORMAL HIGH (ref 11.5–15.5)
WBC: 12.1 10*3/uL — ABNORMAL HIGH (ref 4.0–10.5)

## 2015-10-03 LAB — BASIC METABOLIC PANEL
Anion gap: 2 — ABNORMAL LOW (ref 5–15)
BUN: 8 mg/dL (ref 6–20)
CALCIUM: 7.9 mg/dL — AB (ref 8.9–10.3)
CO2: 28 mmol/L (ref 22–32)
CREATININE: 0.87 mg/dL (ref 0.44–1.00)
Chloride: 110 mmol/L (ref 101–111)
Glucose, Bld: 99 mg/dL (ref 65–99)
Potassium: 3.7 mmol/L (ref 3.5–5.1)
SODIUM: 140 mmol/L (ref 135–145)

## 2015-10-03 LAB — GLUCOSE, CAPILLARY
GLUCOSE-CAPILLARY: 91 mg/dL (ref 65–99)
GLUCOSE-CAPILLARY: 99 mg/dL (ref 65–99)
GLUCOSE-CAPILLARY: 89 mg/dL (ref 65–99)
Glucose-Capillary: 103 mg/dL — ABNORMAL HIGH (ref 65–99)
Glucose-Capillary: 113 mg/dL — ABNORMAL HIGH (ref 65–99)

## 2015-10-03 MED ORDER — INSULIN ASPART 100 UNIT/ML ~~LOC~~ SOLN
0.0000 [IU] | Freq: Three times a day (TID) | SUBCUTANEOUS | Status: DC
  Administered 2015-10-04: 2 [IU] via SUBCUTANEOUS

## 2015-10-03 MED ORDER — ONDANSETRON HCL 4 MG/2ML IJ SOLN: 4.0000 mg | Freq: Four times a day (QID) | INTRAMUSCULAR | Status: DC | PRN

## 2015-10-03 MED ORDER — TRAMADOL HCL 50 MG PO TABS: 50.0000 mg | ORAL_TABLET | ORAL | Status: DC | PRN

## 2015-10-03 MED ORDER — ONDANSETRON HCL 4 MG PO TABS: 4.0000 mg | ORAL_TABLET | Freq: Four times a day (QID) | ORAL | Status: DC | PRN

## 2015-10-03 MED ORDER — LEVALBUTEROL HCL 0.63 MG/3ML IN NEBU: 0.6300 mg | INHALATION_SOLUTION | Freq: Four times a day (QID) | RESPIRATORY_TRACT | Status: DC | PRN

## 2015-10-03 MED ORDER — BISACODYL 5 MG PO TBEC: 10.0000 mg | DELAYED_RELEASE_TABLET | Freq: Every day | ORAL | Status: DC | PRN

## 2015-10-03 MED ORDER — OXYCODONE HCL 5 MG PO TABS: 5.0000 mg | ORAL_TABLET | ORAL | Status: DC | PRN

## 2015-10-03 MED ORDER — ACETAMINOPHEN 325 MG PO TABS: 650.0000 mg | ORAL_TABLET | Freq: Four times a day (QID) | ORAL | Status: DC | PRN

## 2015-10-03 MED ORDER — ASPIRIN EC 325 MG PO TBEC
325.0000 mg | DELAYED_RELEASE_TABLET | Freq: Every day | ORAL | Status: DC
  Administered 2015-10-04 – 2015-10-05 (×2): 325 mg via ORAL
  Filled 2015-10-03 (×2): qty 1

## 2015-10-03 MED ORDER — AMIODARONE HCL 200 MG PO TABS
400.0000 mg | ORAL_TABLET | Freq: Two times a day (BID) | ORAL | Status: DC
  Administered 2015-10-03 – 2015-10-05 (×5): 400 mg via ORAL
  Filled 2015-10-03 (×5): qty 2

## 2015-10-03 MED ORDER — MOVING RIGHT ALONG BOOK
Freq: Once | Status: AC
  Administered 2015-10-03: 14:00:00
  Filled 2015-10-03: qty 1

## 2015-10-03 MED ORDER — SODIUM CHLORIDE 0.9 % IV SOLN: 250.0000 mL | INTRAVENOUS | Status: DC | PRN

## 2015-10-03 MED ORDER — SIMVASTATIN 20 MG PO TABS
20.0000 mg | ORAL_TABLET | Freq: Every day | ORAL | Status: DC
  Administered 2015-10-03 – 2015-10-04 (×2): 20 mg via ORAL
  Filled 2015-10-03 (×2): qty 1

## 2015-10-03 MED ORDER — SODIUM CHLORIDE 0.9% FLUSH
3.0000 mL | Freq: Two times a day (BID) | INTRAVENOUS | Status: DC
  Administered 2015-10-03 – 2015-10-05 (×4): 3 mL via INTRAVENOUS

## 2015-10-03 MED ORDER — POTASSIUM CHLORIDE 10 MEQ/50ML IV SOLN
10.0000 meq | INTRAVENOUS | Status: AC
  Administered 2015-10-03 (×3): 10 meq via INTRAVENOUS
  Filled 2015-10-03: qty 50

## 2015-10-03 MED ORDER — POTASSIUM CHLORIDE CRYS ER 20 MEQ PO TBCR
20.0000 meq | EXTENDED_RELEASE_TABLET | Freq: Two times a day (BID) | ORAL | Status: DC
  Administered 2015-10-03 – 2015-10-05 (×4): 20 meq via ORAL
  Filled 2015-10-03 (×4): qty 1

## 2015-10-03 MED ORDER — DOCUSATE SODIUM 100 MG PO CAPS
200.0000 mg | ORAL_CAPSULE | Freq: Every day | ORAL | Status: DC
  Administered 2015-10-04 – 2015-10-05 (×2): 200 mg via ORAL
  Filled 2015-10-03 (×2): qty 2

## 2015-10-03 MED ORDER — PANTOPRAZOLE SODIUM 40 MG PO TBEC
40.0000 mg | DELAYED_RELEASE_TABLET | Freq: Every day | ORAL | Status: DC
  Administered 2015-10-04 – 2015-10-05 (×2): 40 mg via ORAL
  Filled 2015-10-03 (×2): qty 1

## 2015-10-03 MED ORDER — SODIUM CHLORIDE 0.9% FLUSH: 3.0000 mL | INTRAVENOUS | Status: DC | PRN

## 2015-10-03 MED ORDER — FUROSEMIDE 40 MG PO TABS
40.0000 mg | ORAL_TABLET | Freq: Every day | ORAL | Status: DC
  Administered 2015-10-04 – 2015-10-05 (×2): 40 mg via ORAL
  Filled 2015-10-03 (×3): qty 1

## 2015-10-03 MED ORDER — BISACODYL 10 MG RE SUPP: 10.0000 mg | Freq: Every day | RECTAL | Status: DC | PRN

## 2015-10-03 NOTE — Progress Notes (Signed)
CARDIAC REHAB PHASE I   Pt in bed, states she just walked over from ICU, is expecting company and would like to rest. Pt declines ambulation at this time. Pt states she has already walked twice today. Pt states she hopes to discharge to home on Sunday.  Encouraged additional ambulation x1 today with nursing staff. Will follow up tomorrow for ambulation and to review education with pt and husband.  Lenna Sciara, RN, BSN 10/03/2015 1:45 PM

## 2015-10-03 NOTE — Progress Notes (Signed)
3 Days Post-Op Procedure(s) (LRB): CORONARY ARTERY BYPASS GRAFTING (CABG) TIMES TWO USING LEFT INTERNAL MAMMARY ARTERY AND ENDOSCOPICALLY HARVESTED RIGHT SAPHENOUS VEIN GRAFT (N/A) Subjective:  No complaints. Wants to go home.  Objective: Vital signs in last 24 hours: Temp:  [97.8 F (36.6 C)-98.8 F (37.1 C)] 98.3 F (36.8 C) (06/16 0400) Pulse Rate:  [66-92] 72 (06/16 0700) Cardiac Rhythm:  [-] Normal sinus rhythm (06/16 0700) Resp:  [15-23] 20 (06/16 0700) BP: (102-153)/(46-89) 144/53 mmHg (06/16 0700) SpO2:  [95 %-100 %] 97 % (06/16 0700) Weight:  [84.3 kg (185 lb 13.6 oz)] 84.3 kg (185 lb 13.6 oz) (06/16 0500)  Hemodynamic parameters for last 24 hours:    Intake/Output from previous day: 06/15 0701 - 06/16 0700 In: 1672.5 [P.O.:600; I.V.:737.5; Blood:335] Out: [id number] Intake/Output this shift:    General appearance: alert and cooperative Neurologic: intact Heart: regular rate and rhythm, S1, S2 normal, no murmur, click, rub or gallop Lungs: clear to auscultation bilaterally Extremities: edema mild Wound: incisions ok  Lab Results:  Recent Labs  10/02/15 0524 10/02/15 1631 10/03/15 0350  WBC 14.6*  --  12.1*  HGB 7.5* 8.8* 8.1*  HCT 23.7* 26.0* 25.2*  PLT 161  --  176   BMET:  Recent Labs  10/02/15 0524 10/02/15 1631 10/03/15 0350  NA 136 141 140  K 4.3 3.2* 3.7  CL 105 103 110  CO2 26  --  28  GLUCOSE 134* 126* 99  BUN 6 8 8   CREATININE 0.88 1.00 0.87  CALCIUM 8.0*  --  7.9*    PT/INR:  Recent Labs  09/30/15 1230  LABPROT 17.6*  INR 1.43   ABG    Component Value Date/Time   PHART 7.274* 09/30/2015 2257   HCO3 21.6 09/30/2015 2257   TCO2 26 10/02/2015 1631   ACIDBASEDEF 5.0* 09/30/2015 2257   O2SAT 97.0 09/30/2015 2257   CBG (last 3)   Recent Labs  10/02/15 1931 10/02/15 2341 10/03/15 0354  GLUCAP 121* 97 103*    Assessment/Plan: S/P Procedure(s) (LRB): CORONARY ARTERY BYPASS GRAFTING (CABG) TIMES TWO USING LEFT  INTERNAL MAMMARY ARTERY AND ENDOSCOPICALLY HARVESTED RIGHT SAPHENOUS VEIN GRAFT (N/A) S/P anterior STEMI and emergent CABG. She is hemodynamically stable. Troponin peak 50, trending down. EF preserved on TEE in the OR. Postop atrial fibrillation and ventricular ectopy resolved with amiodarone. Will switch to po today. Continue diuresis and K+ replacement. DM: glucose under good control on Levemir. Preop Hgb A1c was 6.1. She needs attention to diet and education, medical follow up. Will stop Levemir and continue CBG's for now. Heavy smoker preop. Continue IS. Transfer to 2W and continue mobilization, wean off oxygen.   LOS: 3 days    Gaye Pollack 10/03/2015

## 2015-10-03 NOTE — Progress Notes (Signed)
Pt transferred to 2[facility], ambulated 27ft, incontinent episode in hallway, pt transported the rest of the way via wheelchair, vss, pt settled in bed, receiving RN and NT at bedside, husband transferred with [place] and is aware of patient's new room, all patient belongs at side  Kerri Hicks S 12:57 PM

## 2015-10-03 NOTE — Discharge Summary (Signed)
Physician Discharge Summary  Patient ID: Kerri Hicks MRN: [id number] DOB/AGE: [birth year] 70 y.o.  Admit date: 09/30/2015 Discharge date: 10/05/2015  Admission Diagnoses:  Patient Active Problem List   Diagnosis Date Noted  . STEMI (ST elevation myocardial infarction) ([place]) 09/30/2015  . T2DM (type 2 diabetes mellitus) ([place]) 09/30/2015  . Tobacco abuse 09/30/2015  . ST elevation (STEMI) myocardial infarction involving left anterior descending coronary artery ([place]) 09/30/2015  . ST elevation myocardial infarction (STEMI) of anterior wall ([place])   . Controlled type 2 diabetes mellitus with complication, without long-term current use of insulin ([place])   . Essential hypertension    Discharge Diagnoses:   Patient Active Problem List   Diagnosis Date Noted  . STEMI (ST elevation myocardial infarction) ([place]) 09/30/2015  . T2DM (type 2 diabetes mellitus) ([place]) 09/30/2015  . Tobacco abuse 09/30/2015  . ST elevation (STEMI) myocardial infarction involving left anterior descending coronary artery ([place]) 09/30/2015  . S/P CABG x 2 09/30/2015  . ST elevation myocardial infarction (STEMI) of anterior wall ([place])   . Controlled type 2 diabetes mellitus with complication, without long-term current use of insulin ([place])   . Essential hypertension    Discharged Condition: good  History of Present Illness:  Kerri Hicks is a 70 yo obese female with known history of DM and heavy tobacco use.  She was brought to the ED on 09/30/2015 with complaints of chest pain.  The patient awoke around midnight with substernal chest pain with with radiation to her shoulders.  She contacted EMS, but upon there arrival the patient was chest pain free and declined transport to the hospital.  She went back to sleep but was again woken from sleep with similar symptoms at which time her husband transported her to the ED at [facility].  She was treated with Heparin, ASA, and Morphine and transferred to [facility] further care.   She was taken directly to the cath lab and was found to have critical CAD with a preserved EF.  It was felt coronary bypass grafting would be indicated and TCTS consult was obtained.  She was evaluated by Dr. Cyndia Bent who felt she would require emergent Coronary bypass.  The risks and benefits of the procedure were explained to the patient and she was agreeable to proceed.  Hospital Course:   She was taken to the operating room and underwent CABG x 2.  This was done utilizing LIMA to LAD and SVG to PDA.  She underwent Endoscopic harvest of greater saphenous vein from right thigh.  She tolerated the procedure without difficulty and was taken to the SICU in stable condition.  She was extubated the day of surgery.  During her stay in the SICU she suffered multiple runs of SVT and was treated with Lidocaine.  She was weaned off Neo-Synephrine drip as tolerated.  She developed rapid Atrial Fibrillation and was treated with IV Amiodarone.  She successfully converted back to NSR.  She had expected post operative blood loss anemia and was started on iron supplementation, but ultimately she required transfusion of packed cells.  She was diuresed for hypervolemia.  Her chest tube and arterial lines were removed without difficulty.  Her heart rate remained stable and she was felt stable for transfer to the telemetry unit on POD #3.  The patient continued to progress.  Her HR remained tachy and her Lopressor was titrated accordingly.  She was started on a low dose ACE for mild hypertension. Her pacing wires were removed without difficulty prior  to discharge.  She was weaned off oxygen.  She was ambulating without difficulty.  She is borderline diabetic.  Her sugars have been well controlled during her hospitalization and she will need follow up with PCP to ensure adequate treatment should diabetes develop.  She is felt medically stable for discharge home today.           Significant Diagnostic Studies: angiography:    1. Ost LAD lesion, 90% stenosed. Prox-mid LAD lesion, 99% stenosed. 2. Presumably - small Ost 1st Diag to 1st Diag lesion, 100% stenosed. 3. Mid RCA lesion, 85% stenosed. Dist RCA lesion, 40% stenosed. 4. Post Atrio lesion, 99% stenosed. 5. Ost 1st Mrg to 1st Mrg lesion, 50% stenosed. 6. Preserved LVEF with apical hypokinesis. 7. Elevated LVEDP  Treatments: surgery:   1. Median Sternotomy 2. Extracorporeal circulation 3. Coronary artery bypass grafting x 2   Left internal mammary graft to the LAD  SVG to PDA  4. Endoscopic vein harvest from the right leg  Disposition: Home  Discharge Medications:  The patient has been discharged on:   1.Beta Blocker:  Yes [x   ]                              No   [   ]                              If No, reason:  2.Ace Inhibitor/ARB: Yes [ x  ]                                     No  [    ]                                     If No, reason:  3.Statin:   Yes [ x  ]                  No  [   ]                  If No, reason:  4.Ecasa:  Yes  [ x  ]                  No   [   ]                  If No, reason:         Discharge Instructions    Amb Referral to Cardiac Rehabilitation    Complete by:  As directed   Diagnosis:   STEMI CABG    CABG X ___:  2            Medication List    TAKE these medications        acetaminophen 325 MG tablet  Commonly known as:  TYLENOL  Take 2 tablets (650 mg total) by mouth every 6 (six) hours as needed for mild pain.     amiodarone 200 MG tablet  Commonly known as:  PACERONE  Take 1 tablet (200 mg total) by mouth 2 (two) times daily.     aspirin 325 MG EC tablet  Take 1 tablet (325 mg total) by mouth daily.  ferrous [id number] C-folic acid capsule  Commonly known as:  TRINSICON / FOLTRIN  Take 1 capsule by mouth 2 (two) times daily between meals.     furosemide 40 MG tablet  Commonly known as:  LASIX  Take 1 tablet (40 mg total) by mouth daily. For 7  Days     lisinopril 5 MG tablet  Commonly known as:  PRINIVIL,ZESTRIL  Take 1 tablet (5 mg total) by mouth daily.     metoprolol tartrate 25 MG tablet  Commonly known as:  LOPRESSOR  Take 1 tablet (25 mg total) by mouth 2 (two) times daily.     oxyCODONE 5 MG immediate release tablet  Commonly known as:  Oxy IR/ROXICODONE  Take 1-2 tablets (5-10 mg total) by mouth every 3 (three) hours as needed for severe pain.     potassium chloride SA 20 MEQ tablet  Commonly known as:  K-DUR,KLOR-CON  Take 1 tablet (20 mEq total) by mouth daily. For 7 Days     simvastatin 20 MG tablet  Commonly known as:  ZOCOR  Take 1 tablet (20 mg total) by mouth daily at 6 PM.       Follow-up Information    Follow up with Kerin Ransom, PA-C On 10/15/2015.   Specialties:  Cardiology, Radiology   Why:  9:30 AM   Contact information:   [address] [place] [address] [place] [address] [phone]       Follow up with Gaye Pollack, MD On 10/22/2015.   Specialty:  Cardiothoracic Surgery   Why:  Appointment is at 3:30, please get CXR at 3:00   Contact information:   [address] [place] [address] [phone]       Follow up with [facility] [place] On 10/15/2015.   Specialty:  Cardiothoracic Surgery   Why:  For suture removal, Appointment is at 10:00   Contact information:   [address] [place], [place] [phone]      Signed: Ellwood Handler 10/05/2015, 9:23 AM

## 2015-10-03 NOTE — Progress Notes (Signed)
Report called report called to Katharine Look, RN on [address],  Sybrina Laning S 11:44 AM

## 2015-10-04 LAB — TYPE AND SCREEN
ABO/RH(D): A POS
Antibody Screen: NEGATIVE
UNIT DIVISION: 0
Unit division: 0

## 2015-10-04 LAB — BASIC METABOLIC PANEL
Anion gap: 6 (ref 5–15)
BUN: 10 mg/dL (ref 6–20)
CHLORIDE: 108 mmol/L (ref 101–111)
CO2: 25 mmol/L (ref 22–32)
CREATININE: 0.84 mg/dL (ref 0.44–1.00)
Calcium: 8.3 mg/dL — ABNORMAL LOW (ref 8.9–10.3)
GFR calc Af Amer: >60 mL/min (ref >60)
GFR calc non Af Amer: >60 mL/min (ref >60)
GLUCOSE: 106 mg/dL — AB (ref 65–99)
Potassium: 4.1 mmol/L (ref 3.5–5.1)
SODIUM: 139 mmol/L (ref 135–145)

## 2015-10-04 LAB — GLUCOSE, CAPILLARY
GLUCOSE-CAPILLARY: 96 mg/dL (ref 65–99)
GLUCOSE-CAPILLARY: 120 mg/dL — AB (ref 65–99)
Glucose-Capillary: 97 mg/dL (ref 65–99)
Glucose-Capillary: 105 mg/dL — ABNORMAL HIGH (ref 65–99)

## 2015-10-04 MED ORDER — METOPROLOL TARTRATE 25 MG PO TABS
25.0000 mg | ORAL_TABLET | Freq: Two times a day (BID) | ORAL | Status: DC
  Administered 2015-10-04 – 2015-10-05 (×3): 25 mg via ORAL
  Filled 2015-10-04 (×3): qty 1

## 2015-10-04 NOTE — Progress Notes (Signed)
EPWs removed per protocol.  No ectopy or other problems noted at this time.  Instructed on need for one hour of bedrest with VS monitoring.  Will continue to monitor.

## 2015-10-04 NOTE — Progress Notes (Signed)
CARDIAC REHAB PHASE I   PRE:  Rate/Rhythm: 90 sR  BP:  Sitting: 154/65        SaO2: 93 RA  MODE:  Ambulation: 240 ft   POST:  Rate/Rhythm: 112 ST  BP:  Sitting: 125/61         SaO2: 90 RA  Pt ambulated 240 ft on RA, rolling walker, assist x1, steady gait, tolerated well, fairly good use of sternal precautions. Pt sats borderline, 90% on RA with ambulation. Pt c/o mild DOE, fatigue, brief standing rest x3. Cardiac surgery discharge education completed with pt and husband at bedside. Reviewed risk factors, tobacco cessation (gave pt fake cigarette), IS, sternal precautions, activity progression, exercise, heart healthy diet, portion control, sodium restrictions, daily weights, and phase 2 cardiac rehab. Pt verbalized understanding. Pt agrees to phase 2 cardiac rehab referral, will send to [facility] per pt request. Pt would benefit from rolling walker for home use. Encouraged IS, additional ambulation x2 today with staff. Pt to recliner after walk, feet elevated, call bell within reach. Will follow up Monday if pt does not discharge over the weekend.   [id number] Lenna Sciara, RN, BSN 10/04/2015 9:16 AM

## 2015-10-04 NOTE — Progress Notes (Addendum)
      [address]       ,[place] [address]             [phone]      4 Days Post-Op Procedure(s) (LRB): CORONARY ARTERY BYPASS GRAFTING (CABG) TIMES TWO USING LEFT INTERNAL MAMMARY ARTERY AND ENDOSCOPICALLY HARVESTED RIGHT SAPHENOUS VEIN GRAFT (N/A)   Subjective:  Ms. Lucke has no new complaints.  She is hoping to go home soon.  + ambulation  + BM  Objective: Vital signs in last 24 hours: Temp:  [97.8 F (36.6 C)-98.6 F (37 C)] 98.6 F (37 C) (06/17 0308) Pulse Rate:  [71-95] 95 (06/17 0308) Cardiac Rhythm:  [-] Normal sinus rhythm (06/17 0800) Resp:  [18-22] 18 (06/17 0308) BP: (119-146)/(44-84) 143/84 mmHg (06/17 0308) SpO2:  [91 %-100 %] 92 % (06/17 0308)  Intake/Output from previous day: 06/16 0701 - 06/17 0700 In: 2630.4 [P.O.:240; I.V.:2240.4; IV Piggyback:150] Out: 110 [Urine:110]  General appearance: alert, cooperative and no distress Heart: regular rate and rhythm Lungs: clear to auscultation bilaterally Abdomen: soft, non-tender; bowel sounds normal; no masses,  no organomegaly Extremities: edema trace Wound: clean and dry, RLE ecchymotic  Lab Results:  Recent Labs  10/02/15 0524 10/02/15 1631 10/03/15 0350  WBC 14.6*  --  12.1*  HGB 7.5* 8.8* 8.1*  HCT 23.7* 26.0* 25.2*  PLT 161  --  176   BMET:  Recent Labs  10/03/15 0350 10/04/15 0300  NA 140 139  K 3.7 4.1  CL 110 108  CO2 28 25  GLUCOSE 99 106*  BUN 8 10  CREATININE 0.87 0.84  CALCIUM 7.9* 8.3*    PT/INR: No results for input(s): LABPROT, INR in the last 72 hours. ABG    Component Value Date/Time   PHART 7.274* 09/30/2015 2257   HCO3 21.6 09/30/2015 2257   TCO2 26 10/02/2015 1631   ACIDBASEDEF 5.0* 09/30/2015 2257   O2SAT 97.0 09/30/2015 2257   CBG (last 3)   Recent Labs  10/03/15 1640 10/03/15 2018 10/04/15 0624  GLUCAP 89 113* 96    Assessment/Plan: S/P Procedure(s) (LRB): CORONARY ARTERY BYPASS GRAFTING (CABG) TIMES TWO USING LEFT INTERNAL  MAMMARY ARTERY AND ENDOSCOPICALLY HARVESTED RIGHT SAPHENOUS VEIN GRAFT (N/A)  1. CV- Sinus Tach- will continue Amiodarone, increase Lopressor to 25 mg BID... Mild HTN will monitor and if needed can start antihypertensive tomorrow 2. Pulm- just taken off oxygen, continue IS 3. Renal- creatinine stable, + hypervolemia, continue Lasix, Hypokalemia improved 4. Borderline DM- sugars currently controlled 5. dispo- patient stable, will d/c EPW today, increase Lopressor, off oxygen currently.. If remains stable plan for d/c in AM   LOS: 4 days    Ahmed Prima, ERIN 10/04/2015   Chart reviewed, patient examined, agree with above. She has remained off oxygen so far today and is ambulating well.

## 2015-10-05 DIAGNOSIS — I2109 ST elevation (STEMI) myocardial infarction involving other coronary artery of anterior wall: Secondary | ICD-10-CM

## 2015-10-05 LAB — GLUCOSE, CAPILLARY
GLUCOSE-CAPILLARY: 111 mg/dL — AB (ref 65–99)
Glucose-Capillary: 104 mg/dL — ABNORMAL HIGH (ref 65–99)

## 2015-10-05 MED ORDER — ASPIRIN 325 MG PO TBEC: 325.0000 mg | DELAYED_RELEASE_TABLET | Freq: Every day | ORAL | Status: DC

## 2015-10-05 MED ORDER — ACETAMINOPHEN 325 MG PO TABS: 650.0000 mg | ORAL_TABLET | Freq: Four times a day (QID) | ORAL | Status: AC | PRN

## 2015-10-05 MED ORDER — METOPROLOL TARTRATE 25 MG PO TABS: 25.0000 mg | ORAL_TABLET | Freq: Two times a day (BID) | ORAL | Status: DC

## 2015-10-05 MED ORDER — LISINOPRIL 5 MG PO TABS
5.0000 mg | ORAL_TABLET | Freq: Every day | ORAL | Status: DC
  Administered 2015-10-05: 5 mg via ORAL
  Filled 2015-10-05: qty 1

## 2015-10-05 MED ORDER — AMIODARONE HCL 200 MG PO TABS: 200.0000 mg | ORAL_TABLET | Freq: Two times a day (BID) | ORAL | Status: DC

## 2015-10-05 MED ORDER — SIMVASTATIN 20 MG PO TABS: 20.0000 mg | ORAL_TABLET | Freq: Every day | ORAL | Status: DC

## 2015-10-05 MED ORDER — LISINOPRIL 5 MG PO TABS: 5.0000 mg | ORAL_TABLET | Freq: Every day | ORAL | Status: DC

## 2015-10-05 MED ORDER — OXYCODONE HCL 5 MG PO TABS: 5.0000 mg | ORAL_TABLET | ORAL | Status: DC | PRN

## 2015-10-05 MED ORDER — FUROSEMIDE 40 MG PO TABS: 40.0000 mg | ORAL_TABLET | Freq: Every day | ORAL | Status: DC

## 2015-10-05 MED ORDER — POTASSIUM CHLORIDE CRYS ER 20 MEQ PO TBCR: 20.0000 meq | EXTENDED_RELEASE_TABLET | Freq: Every day | ORAL | Status: DC

## 2015-10-05 MED ORDER — FE FUMARATE-B12-VIT C-FA-IFC PO CAPS: 1.0000 | ORAL_CAPSULE | Freq: Two times a day (BID) | ORAL | Status: DC

## 2015-10-05 NOTE — Progress Notes (Signed)
      [address]       [place] [address]             [phone]      5 Days Post-Op Procedure(s) (LRB): CORONARY ARTERY BYPASS GRAFTING (CABG) TIMES TWO USING LEFT INTERNAL MAMMARY ARTERY AND ENDOSCOPICALLY HARVESTED RIGHT SAPHENOUS VEIN GRAFT (N/A)   Subjective:  Kerri Hicks has no complaints.  She continues to feel well.  She has remained off oxygen and is ready to go home.  Objective: Vital signs in last 24 hours: Temp:  [98.2 F (36.8 C)-99.1 F (37.3 C)] 99 F (37.2 C) (06/18 0429) Pulse Rate:  [83-97] 89 (06/18 0429) Cardiac Rhythm:  [-] Normal sinus rhythm (06/18 0710) Resp:  [16-17] 16 (06/18 0429) BP: (131-138)/(46-75) 135/56 mmHg (06/18 0429) SpO2:  [94 %] 94 % (06/18 0429) Weight:  [184 lb 15.4 oz (83.899 kg)] 184 lb 15.4 oz (83.899 kg) (06/18 0642)  Intake/Output from previous day: 06/17 0701 - 06/18 0700 In: 720 [P.O.:720] Out: -   General appearance: alert, cooperative and no distress Heart: regular rate and rhythm Lungs: clear to auscultation bilaterally Abdomen: soft, non-tender; bowel sounds normal; no masses,  no organomegaly Extremities: edema trace Wound: clean and dry  Lab Results:  Recent Labs  10/02/15 1631 10/03/15 0350  WBC  --  12.1*  HGB 8.8* 8.1*  HCT 26.0* 25.2*  PLT  --  176   BMET:  Recent Labs  10/03/15 0350 10/04/15 0300  NA 140 139  K 3.7 4.1  CL 110 108  CO2 28 25  GLUCOSE 99 106*  BUN 8 10  CREATININE 0.87 0.84  CALCIUM 7.9* 8.3*    PT/INR: No results for input(s): LABPROT, INR in the last 72 hours. ABG    Component Value Date/Time   PHART 7.274* 09/30/2015 2257   HCO3 21.6 09/30/2015 2257   TCO2 26 10/02/2015 1631   ACIDBASEDEF 5.0* 09/30/2015 2257   O2SAT 97.0 09/30/2015 2257   CBG (last 3)   Recent Labs  10/04/15 1648 10/04/15 2123 10/05/15 0617  GLUCAP 105* 120* 104*    Assessment/Plan: S/P Procedure(s) (LRB): CORONARY ARTERY BYPASS GRAFTING (CABG) TIMES TWO USING LEFT  INTERNAL MAMMARY ARTERY AND ENDOSCOPICALLY HARVESTED RIGHT SAPHENOUS VEIN GRAFT (N/A)  1. CV- remains hemodynamically stable, continue Lopressor at 25 mg BID, will add low dose ACE for BP control 2. Pulm- off oxygen, instructed on importance of continued use of IS at discharge 3. Renal-remains hypervolemic, weight is trending down, will continue Lasix 4. Borderline DM- sugars have been well controlled, will require close follow up with PCP 5. Dispo- patient stable, will d/c home today   LOS: 5 days    Ahmed Prima, Junie Panning 10/05/2015

## 2015-10-05 NOTE — Progress Notes (Signed)
Discharge Note:  Patient alert and oriented X 4 and in no distress.  Patient given discharge instructions regarding signs and symptoms to report, medications, activity, diet, and upcoming appointments.  Patient verbalized understanding of all instructions. Telemetry and peripheral IV discontinued. Patient confirmed that she had all of her personal belongings.  She was transported out via wheelchair by Chartered certified accountant.

## 2015-10-06 NOTE — Anesthesia Postprocedure Evaluation (Signed)
Anesthesia Post Note  Patient: Kerri Hicks  Procedure(s) Performed: Procedure(s) (LRB): CORONARY ARTERY BYPASS GRAFTING (CABG) TIMES TWO USING LEFT INTERNAL MAMMARY ARTERY AND ENDOSCOPICALLY HARVESTED RIGHT SAPHENOUS VEIN GRAFT (N/A)  Patient location during evaluation: SICU Anesthesia Type: General Level of consciousness: patient remains intubated per anesthesia plan Pain management: pain level controlled Vital Signs Assessment: post-procedure vital signs reviewed and stable Respiratory status: patient remains intubated per anesthesia plan Cardiovascular status: stable    Last Vitals:  Filed Vitals:   10/04/15 2125 10/05/15 0429  BP: 133/75 135/56  Pulse: 97 89  Temp: 37.3 C 37.2 C  Resp: 16 16    Last Pain:  Filed Vitals:   10/05/15 1040  PainSc: 0-No pain                 EDWARDS,Sasuke Yaffe

## 2015-10-15 ENCOUNTER — Encounter: Payer: Self-pay | Admitting: Cardiology

## 2015-10-15 ENCOUNTER — Encounter (INDEPENDENT_AMBULATORY_CARE_PROVIDER_SITE_OTHER): Payer: Self-pay

## 2015-10-15 ENCOUNTER — Ambulatory Visit (INDEPENDENT_AMBULATORY_CARE_PROVIDER_SITE_OTHER): Payer: Commercial Managed Care - HMO | Admitting: Cardiology

## 2015-10-15 VITALS — BP 146/64 | HR 59 | Ht 62.0 in | Wt 175.2 lb

## 2015-10-15 DIAGNOSIS — Z951 Presence of aortocoronary bypass graft: Secondary | ICD-10-CM | POA: Diagnosis not present

## 2015-10-15 DIAGNOSIS — I480 Paroxysmal atrial fibrillation: Secondary | ICD-10-CM | POA: Diagnosis not present

## 2015-10-15 DIAGNOSIS — Z720 Tobacco use: Secondary | ICD-10-CM | POA: Diagnosis not present

## 2015-10-15 DIAGNOSIS — I2102 ST elevation (STEMI) myocardial infarction involving left anterior descending coronary artery: Secondary | ICD-10-CM | POA: Diagnosis not present

## 2015-10-15 DIAGNOSIS — R0989 Other specified symptoms and signs involving the circulatory and respiratory systems: Secondary | ICD-10-CM | POA: Insufficient documentation

## 2015-10-15 NOTE — Assessment & Plan Note (Signed)
She says she is not smoking

## 2015-10-15 NOTE — Assessment & Plan Note (Addendum)
Post CABG- Amiodarone-NSR (CHADs VASc=5 but PAF felt to be secondary to surgery)

## 2015-10-15 NOTE — Assessment & Plan Note (Signed)
Anterior STEMI 09/30/15-urgent CABG x 2

## 2015-10-15 NOTE — Patient Instructions (Signed)
Medication Instructions:  DECREASE YOUR AMIODARONE TO 200 MG DAILY  DECREASE YOUR IRON TO ONCE A DAY  Labwork: NONE  Testing/Procedures: Your physician has requested that you have a carotid duplex. This test is an ultrasound of the carotid arteries in your neck. It looks at blood flow through these arteries that supply the brain with blood. Allow one hour for this exam. There are no restrictions or special instructions.  Follow-Up: Your physician recommends that you schedule a follow-up appointment in: [place] (WILL BE AFTER YOU HAVE CAROTID DUPLEX)  If you need a refill on your cardiac medications before your next appointment, please call your pharmacy.

## 2015-10-15 NOTE — Progress Notes (Signed)
10/15/2015 Kerri Hicks   02-08-1946  [id number]  Primary Physician No PCP Per Patient Primary Cardiologist: Will be [place] office  HPI:  70 y/o female from [place], developed chest pain 09/30/15 and called EMS. She says her EKG then was "OK" and she was not taken to the ED. @ hours later she had her husband take her to [facility] where she was diagnosed with an anterior STEMI and transported to [facility].  Cath revealed severe 2 V CAD with preserved LVF. She underwent urgent CABG x 2 with an LIMA-LAD and an SVG-PDA. Post op she had PAF which converted with Amiodarone. She was discharged on PO Amiodarone and ASA. She is in the office today for follow up. She is doing well, some anorexia and trouble sleeping which I assured her was not uncommon post CABG. She denies any tachycardia or unusual dyspnea. She says she is not smoking.    Current Outpatient Prescriptions  Medication Sig Dispense Refill  . acetaminophen (TYLENOL) 325 MG tablet Take 2 tablets (650 mg total) by mouth every 6 (six) hours as needed for mild pain.    Marland Kitchen amiodarone (PACERONE) 200 MG tablet Take 200 mg by mouth daily.    Marland Kitchen aspirin EC 325 MG EC tablet Take 1 tablet (325 mg total) by mouth daily. 30 tablet 0  . ferrous Q000111Q C-folic acid (TRINSICON / FOLTRIN) capsule Take 1 capsule by mouth daily.    Marland Kitchen lisinopril (PRINIVIL,ZESTRIL) 5 MG tablet Take 1 tablet (5 mg total) by mouth daily. 30 tablet 3  . metoprolol tartrate (LOPRESSOR) 25 MG tablet Take 1 tablet (25 mg total) by mouth 2 (two) times daily. 60 tablet 3  . oxyCODONE (OXY IR/ROXICODONE) 5 MG immediate release tablet Take 1-2 tablets (5-10 mg total) by mouth every 3 (three) hours as needed for severe pain. 30 tablet 0  . simvastatin (ZOCOR) 20 MG tablet Take 1 tablet (20 mg total) by mouth daily at 6 PM. 30 tablet 3   No current facility-administered medications for this visit.    Allergies  Allergen Reactions  . Penicillins Rash    Social  History   Social History  . Marital Status: Married    Spouse Name: N/A  . Number of Children: N/A  . Years of Education: N/A   Occupational History  . Not on file.   Social History Main Topics  . Smoking status: Former Smoker -- 1.00 packs/day for 50 years    Types: Cigarettes    Quit date: 09/30/2015  . Smokeless tobacco: Never Used  . Alcohol Use: Not on file  . Drug Use: Not on file  . Sexual Activity: Not on file   Other Topics Concern  . Not on file   Social History Narrative     Review of Systems: General: negative for chills, fever, night sweats or weight changes.  Cardiovascular: negative for chest pain, dyspnea on exertion, edema, orthopnea, palpitations, paroxysmal nocturnal dyspnea or shortness of breath Dermatological: negative for rash Respiratory: negative for cough or wheezing Urologic: negative for hematuria Abdominal: negative for nausea, vomiting, diarrhea, bright red blood per rectum, melena, or hematemesis Neurologic: negative for visual changes, syncope, or dizziness All other systems reviewed and are otherwise negative except as noted above.    Blood pressure 146/64, pulse 59, height 5\' 2"  (1.575 m), weight 175 lb 3.2 oz (79.47 kg).  General appearance: alert, cooperative, no distress and mildly obese Neck: no JVD and bilateral CA bruits Lungs: clear to auscultation bilaterally Heart:  regular rate and rhythm Extremities: trace edema Skin: pale cool dry Neurologic: Grossly normal  EKG NSR with inferior and anterior TWI  ASSESSMENT AND PLAN:   Tobacco abuse She says she is not smoking  ST elevation (STEMI) myocardial infarction involving left anterior descending coronary artery (HCC) Anterior STEMI 09/30/15-urgent CABG x 2  PAF (paroxysmal atrial fibrillation) (HCC) Post CABG- Amiodarone-NSR (CHADs VASc=5 but PAF felt to be secondary to surgery)  S/P CABG x 2 CABG x 2-LIMA-LAD, SVG-PDA (urgent) 09/30/15 Preserved LVF at  cath  Bilateral carotid bruits No history of stroke- no dopplers pre CABG secondary to urgency of the case    PLAN  Decrease Amiodarone to 200 mg daily- we may be able to stop this in 6 weeks if she maintains NSR. Decrease Iron to QD secondary to anorexia. Carotid dopplers and then I will arrange long term cardiology f/u in [place].   Kerin Ransom PA-C 10/15/2015 10:44 AM

## 2015-10-15 NOTE — Assessment & Plan Note (Signed)
No history of stroke- no dopplers pre CABG secondary to urgency of the case

## 2015-10-15 NOTE — Assessment & Plan Note (Signed)
CABG x 2-LIMA-LAD, SVG-PDA (urgent) 09/30/15 Preserved LVF at cath

## 2015-10-20 ENCOUNTER — Other Ambulatory Visit: Payer: Self-pay | Admitting: Surgery

## 2015-10-20 DIAGNOSIS — Z951 Presence of aortocoronary bypass graft: Secondary | ICD-10-CM

## 2015-10-22 ENCOUNTER — Encounter: Payer: Self-pay | Admitting: Surgery

## 2015-10-22 ENCOUNTER — Ambulatory Visit
Admission: RE | Admit: 2015-10-22 | Discharge: 2015-10-22 | Disposition: A | Discharge status: Home / Self Care | Payer: Commercial Managed Care - HMO | Source: Ambulatory Visit | Attending: Surgery | Admitting: Surgery

## 2015-10-22 ENCOUNTER — Ambulatory Visit (INDEPENDENT_AMBULATORY_CARE_PROVIDER_SITE_OTHER): Payer: Self-pay | Admitting: Surgery

## 2015-10-22 VITALS — BP 155/72 | HR 59 | Resp 16 | Ht 62.0 in | Wt 176.6 lb

## 2015-10-22 DIAGNOSIS — Z951 Presence of aortocoronary bypass graft: Secondary | ICD-10-CM

## 2015-10-22 DIAGNOSIS — J90 Pleural effusion, not elsewhere classified: Secondary | ICD-10-CM | POA: Diagnosis not present

## 2015-10-22 DIAGNOSIS — I2510 Atherosclerotic heart disease of native coronary artery without angina pectoris: Secondary | ICD-10-CM

## 2015-10-24 ENCOUNTER — Encounter: Payer: Self-pay | Admitting: Surgery

## 2015-10-24 NOTE — Progress Notes (Signed)
     HPI: Patient returns for routine postoperative follow-up having undergone CABG x 2  on 09/30/2015. The patient's early postoperative recovery while in the hospital was notable for development of postoperative atrial fibrillation. Since hospital discharge the patient reports that she has been feeling well. She has noted no fast or irregular heart rate.    Current Outpatient Prescriptions  Medication Sig Dispense Refill  . acetaminophen (TYLENOL) 325 MG tablet Take 2 tablets (650 mg total) by mouth every 6 (six) hours as needed for mild pain.    Marland Kitchen amiodarone (PACERONE) 200 MG tablet Take 200 mg by mouth daily.    Marland Kitchen aspirin EC 325 MG EC tablet Take 1 tablet (325 mg total) by mouth daily. 30 tablet 0  . ferrous Q000111Q C-folic acid (TRINSICON / FOLTRIN) capsule Take 1 capsule by mouth daily.    Marland Kitchen lisinopril (PRINIVIL,ZESTRIL) 5 MG tablet Take 1 tablet (5 mg total) by mouth daily. 30 tablet 3  . metoprolol tartrate (LOPRESSOR) 25 MG tablet Take 1 tablet (25 mg total) by mouth 2 (two) times daily. 60 tablet 3  . simvastatin (ZOCOR) 20 MG tablet Take 1 tablet (20 mg total) by mouth daily at 6 PM. 30 tablet 3  . oxyCODONE (OXY IR/ROXICODONE) 5 MG immediate release tablet Take 1-2 tablets (5-10 mg total) by mouth every 3 (three) hours as needed for severe pain. (Patient not taking: Reported on 10/22/2015) 30 tablet 0   No current facility-administered medications for this visit.    Physical Exam: BP 155/72 mmHg  Pulse 59  Resp 16  Ht 5\' 2"  (1.575 m)  Wt 176 lb 9.6 oz (80.105 kg)  BMI 32.29 kg/m2  SpO2 98% She looks well. Lung exam is clear. Cardiac exam shows a regular rate and rhythm with normal heart sounds. Chest incision is healing well and sternum is stable. The leg incisions are healing well and there is no peripheral edema.   Diagnostic Tests:  CLINICAL DATA: Status post CABG on September 30, 2015, doing well clinically.  EXAM: CHEST 2 VIEW  COMPARISON: PA  and lateral chest x-ray of October 02, 2015.  FINDINGS: The lungs are adequately inflated. There is no focal infiltrate. There is a trace of blunting of the left lateral and posterior costophrenic angles. Minimal atelectasis or scarring is noted adjacent to the cardiac apex. The heart and pulmonary vascularity are normal. The sternal wires are intact. The mediastinum is normal in width. There is mild multilevel degenerative disc disease.  IMPRESSION: There is no CHF nor residual atelectasis or pneumonia. A trace of pleural fluid remains on the left.   Electronically Signed  By: David Martinique M.D.  On: 10/22/2015 15:22   Impression:  Overall I think she is doing well. I encouraged her to continue walking. She is planning to participate in cardiac rehab. I told her that she could drive her car but should not lift anything heavier than 10 lbs for three months postop. She appears to be maintaining sinus rhythm and should be able to get off amiodarone in the near future.   Plan:  She will continue to follow up with cardiology and will return to see me if she has any problems with her incisions.    Gaye Pollack, MD [facility] [phone]

## 2015-10-27 ENCOUNTER — Ambulatory Visit (HOSPITAL_COMMUNITY)
Admission: RE | Admit: 2015-10-27 | Discharge: 2015-10-27 | Disposition: A | Discharge status: Home / Self Care | Payer: Commercial Managed Care - HMO | Source: Ambulatory Visit | Attending: Cardiology | Admitting: Cardiology

## 2015-10-27 DIAGNOSIS — I6523 Occlusion and stenosis of bilateral carotid arteries: Secondary | ICD-10-CM | POA: Insufficient documentation

## 2015-10-27 DIAGNOSIS — E119 Type 2 diabetes mellitus without complications: Secondary | ICD-10-CM | POA: Diagnosis not present

## 2015-10-27 DIAGNOSIS — Z87891 Personal history of nicotine dependence: Secondary | ICD-10-CM | POA: Diagnosis not present

## 2015-10-27 DIAGNOSIS — I2510 Atherosclerotic heart disease of native coronary artery without angina pectoris: Secondary | ICD-10-CM | POA: Diagnosis not present

## 2015-10-27 DIAGNOSIS — R0989 Other specified symptoms and signs involving the circulatory and respiratory systems: Secondary | ICD-10-CM | POA: Diagnosis not present

## 2015-10-27 DIAGNOSIS — I480 Paroxysmal atrial fibrillation: Secondary | ICD-10-CM

## 2015-10-27 DIAGNOSIS — I10 Essential (primary) hypertension: Secondary | ICD-10-CM | POA: Diagnosis not present

## 2015-11-28 ENCOUNTER — Ambulatory Visit (INDEPENDENT_AMBULATORY_CARE_PROVIDER_SITE_OTHER): Payer: Commercial Managed Care - HMO | Admitting: Cardiovascular Disease

## 2015-11-28 ENCOUNTER — Encounter: Payer: Self-pay | Admitting: Cardiovascular Disease

## 2015-11-28 VITALS — BP 140/80 | HR 55 | Ht 62.0 in | Wt 177.2 lb

## 2015-11-28 DIAGNOSIS — I10 Essential (primary) hypertension: Secondary | ICD-10-CM

## 2015-11-28 DIAGNOSIS — E785 Hyperlipidemia, unspecified: Secondary | ICD-10-CM

## 2015-11-28 DIAGNOSIS — Z951 Presence of aortocoronary bypass graft: Secondary | ICD-10-CM | POA: Diagnosis not present

## 2015-11-28 DIAGNOSIS — I739 Peripheral vascular disease, unspecified: Secondary | ICD-10-CM

## 2015-11-28 DIAGNOSIS — I779 Disorder of arteries and arterioles, unspecified: Secondary | ICD-10-CM | POA: Diagnosis not present

## 2015-11-28 MED ORDER — METOPROLOL TARTRATE 25 MG PO TABS
25.0000 mg | ORAL_TABLET | Freq: Two times a day (BID) | ORAL | 3 refills | Status: DC
Start: 2015-11-28 — End: 2015-12-29

## 2015-11-28 MED ORDER — SIMVASTATIN 20 MG PO TABS: 20.0000 mg | ORAL_TABLET | Freq: Every day | ORAL | 3 refills | Status: DC

## 2015-11-28 MED ORDER — LISINOPRIL 10 MG PO TABS: 10.0000 mg | ORAL_TABLET | Freq: Every day | ORAL | 3 refills | Status: DC

## 2015-11-28 NOTE — Patient Instructions (Addendum)
Medication Instructions:  Your physician has recommended you make the following change in your medication:  STOP taking amiodarone INCREASE lisinopril to 10mg  once daily   Labwork: Fasting lipid and liver profile at [facility]. Nothing to eat or drink after midnight the evening before your labs.   Testing/Procedures: none  Follow-Up: Your physician recommends that you schedule a follow-up appointment in: 3 months with Dr. Fletcher Anon.    Any Other Special Instructions Will Be Listed Below (If Applicable).     If you need a refill on your cardiac medications before your next appointment, please call your pharmacy.

## 2015-11-28 NOTE — Progress Notes (Signed)
Cardiology Office Note   Date:  11/28/2015   ID:  DAVAYA PENNYCUFF, [name] December 28, 1945, MRN [id number]  PCP:  Glendon Axe, MD  Cardiologist:   Kathlyn Sacramento, MD   Chief Complaint  Patient presents with  . Other    6 week follow up s/p CABG. Meds reviewed by the patient verbally. "doing well."       History of Present Illness: Kerri Hicks is a 70 y.o. female who presents for  A follow-up visit regarding coronary artery diseaseStatus post CABG in June of this year. She presented with anterior ST elevation myocardial infarction and was found to have significant two-vessel coronary artery disease including ostial LAD with normal ejection fraction. She underwent CABG 2 with LIMA to LAD and SVG to right PDA. She had postoperative atrial fibrillation. She is known to have moderate bilateral carotid disease, hyperlipidemia and previous tobacco use. She has been doing extremely well and denies any chest pain, shortness of breath or palpitations. She has been taking her medications regularly. She is not able to attend cardiac rehabilitation due to cost. She is not able to afford the co-pay's.    Past Medical History:  Diagnosis Date  . Diabetes mellitus without complication [facility])     Past Surgical History:  Procedure Laterality Date  . CARDIAC CATHETERIZATION N/A 09/30/2015   Procedure: Left Heart Cath and Coronary Angiography;  Surgeon: Leonie Man, MD;  Location: [place] CV LAB;  Service: Cardiovascular;  Laterality: N/A;  . CORONARY ARTERY BYPASS GRAFT N/A 09/30/2015   Procedure: CORONARY ARTERY BYPASS GRAFTING (CABG) TIMES TWO USING LEFT INTERNAL MAMMARY ARTERY AND ENDOSCOPICALLY HARVESTED RIGHT SAPHENOUS VEIN GRAFT;  Surgeon: Gaye Pollack, MD;  Location: [place];  Service: Open Heart Surgery;  Laterality: N/A;     Current Outpatient Prescriptions  Medication Sig Dispense Refill  . acetaminophen (TYLENOL) 325 MG tablet Take 2 tablets (650 mg total) by mouth every 6  (six) hours as needed for mild pain.    Marland Kitchen amiodarone (PACERONE) 200 MG tablet Take 200 mg by mouth daily.    Marland Kitchen aspirin EC 325 MG EC tablet Take 1 tablet (325 mg total) by mouth daily. 30 tablet 0  . lisinopril (PRINIVIL,ZESTRIL) 5 MG tablet Take 1 tablet (5 mg total) by mouth daily. 30 tablet 3  . metoprolol tartrate (LOPRESSOR) 25 MG tablet Take 1 tablet (25 mg total) by mouth 2 (two) times daily. 60 tablet 3  . simvastatin (ZOCOR) 20 MG tablet Take 1 tablet (20 mg total) by mouth daily at 6 PM. 30 tablet 3   No current facility-administered medications for this visit.     Allergies:   Penicillins    Social History:  The patient  reports that she quit smoking about 8 weeks ago. Her smoking use included Cigarettes. She has a 50.00 pack-year smoking history. She has never used smokeless tobacco.   Family History:  The patient's family history includes Heart attack in her mother; Heart disease in her brother and mother; Leukemia in her father.    ROS:  Please see the history of present illness.   Otherwise, review of systems are positive for none.   All other systems are reviewed and negative.    PHYSICAL EXAM: VS:  BP 140/80 (BP Location: Left Arm, Patient Position: Sitting, Cuff Size: Normal)   Pulse (!) 55   Ht 5\' 2"  (1.575 m)   Wt 177 lb 4 oz (80.4 kg)   BMI 32.42 kg/m  , BMI  Body mass index is 32.42 kg/m. GEN: Well nourished, well developed, in no acute distress  HEENT: normal  Neck: no JVD or masses. Bilateral carotid bruits Cardiac: RRR; no rubs, or gallops,no edema . One out of 6 systolic ejection murmur in the aortic area Respiratory:  clear to auscultation bilaterally, normal work of breathing GI: soft, nontender, nondistended, + BS MS: no deformity or atrophy  Skin: warm and dry, no rash Neuro:  Strength and sensation are intact Psych: euthymic mood, full affect   EKG:  EKG is ordered today. The ekg ordered today demonstrates sinus bradycardia with nonspecific T  wave changes.   Recent Labs: 09/30/2015: ALT 12; TSH 0.920 10/01/2015: Magnesium 2.3 10/03/2015: Hemoglobin 8.1; Platelets 176 10/04/2015: BUN 10; Creatinine, Ser 0.84; Potassium 4.1; Sodium 139    Lipid Panel    Component Value Date/Time   CHOL 150 09/30/2015 0545   TRIG 63 09/30/2015 0545   HDL 33 (L) 09/30/2015 0545   CHOLHDL 4.5 09/30/2015 0545   VLDL 13 09/30/2015 0545   LDLCALC 104 (H) 09/30/2015 0545      Wt Readings from Last 3 Encounters:  11/28/15 177 lb 4 oz (80.4 kg)  10/22/15 176 lb 9.6 oz (80.1 kg)  10/15/15 175 lb 3.2 oz (79.5 kg)        ASSESSMENT AND PLAN:  1.  Coronary artery disease involving native coronary arteries without angina: She is overall doing very well with no anginal symptoms. I advised her to stay active and increase her physical activities. Unfortunately, she is not able to attend cardiac rehabilitation due to cost.  2. Bilateral carotid disease: Carotid Doppler last month showed 40-59% bilateral ICA stenosis. She does have bilateral bruits. Repeat carotid Doppler in July 2018. Continue aggressive treatment of risk factors.  3. Postoperative atrial fibrillation: No evidence of recurrent arrhythmia and she is noted to be in sinus rhythm. Thus, I discontinued amiodarone today.  4. Hyperlipidemia: She is currently on simvastatin 20 mg once daily. This was started after her surgery. I requested fasting lipid and liver profile and will have a low threshold to switch to a more potent statin to achieve an LDL below 70.    Disposition:   FU with me in 3 months  Signed,  Kathlyn Sacramento, MD  11/28/2015 10:27 AM    [place]

## 2015-12-01 DIAGNOSIS — I2510 Atherosclerotic heart disease of native coronary artery without angina pectoris: Secondary | ICD-10-CM | POA: Diagnosis not present

## 2015-12-01 DIAGNOSIS — I6523 Occlusion and stenosis of bilateral carotid arteries: Secondary | ICD-10-CM | POA: Diagnosis not present

## 2015-12-01 DIAGNOSIS — E7800 Pure hypercholesterolemia, unspecified: Secondary | ICD-10-CM | POA: Diagnosis not present

## 2015-12-02 ENCOUNTER — Other Ambulatory Visit
Admission: RE | Admit: 2015-12-02 | Discharge: 2015-12-02 | Disposition: A | Discharge status: Home / Self Care | Payer: Commercial Managed Care - HMO | Source: Ambulatory Visit | Attending: Cardiovascular Disease | Admitting: Cardiovascular Disease

## 2015-12-02 DIAGNOSIS — E785 Hyperlipidemia, unspecified: Secondary | ICD-10-CM | POA: Insufficient documentation

## 2015-12-02 LAB — LIPID PANEL
CHOL/HDL RATIO: 2.9 ratio
Cholesterol: 147 mg/dL (ref 0–200)
HDL: 51 mg/dL (ref >40)
LDL Cholesterol: 70 mg/dL (ref 0–99)
Triglycerides: 130 mg/dL (ref <150)
VLDL: 26 mg/dL (ref 0–40)

## 2015-12-02 LAB — HEPATIC FUNCTION PANEL
ALBUMIN: 4.1 g/dL (ref 3.5–5.0)
ALK PHOS: 92 U/L (ref 38–126)
ALT: 13 U/L — ABNORMAL LOW (ref 14–54)
AST: 17 U/L (ref 15–41)
Bilirubin, Direct: <0.1 mg/dL — ABNORMAL LOW (ref 0.1–0.5)
TOTAL PROTEIN: 7.4 g/dL (ref 6.5–8.1)
Total Bilirubin: 0.5 mg/dL (ref 0.3–1.2)

## 2015-12-29 ENCOUNTER — Other Ambulatory Visit: Payer: Self-pay | Admitting: *Deleted

## 2015-12-29 MED ORDER — SIMVASTATIN 20 MG PO TABS: 20.0000 mg | ORAL_TABLET | Freq: Every day | ORAL | 3 refills | Status: DC

## 2015-12-29 MED ORDER — METOPROLOL TARTRATE 25 MG PO TABS
25.0000 mg | ORAL_TABLET | Freq: Two times a day (BID) | ORAL | 3 refills | Status: DC
Start: 2015-12-29 — End: 2016-02-19

## 2015-12-29 MED ORDER — LISINOPRIL 10 MG PO TABS: 10.0000 mg | ORAL_TABLET | Freq: Every day | ORAL | 3 refills | Status: DC

## 2016-02-19 ENCOUNTER — Encounter: Payer: Self-pay | Admitting: Cardiovascular Disease

## 2016-02-19 ENCOUNTER — Ambulatory Visit (INDEPENDENT_AMBULATORY_CARE_PROVIDER_SITE_OTHER): Payer: Commercial Managed Care - HMO | Admitting: Cardiovascular Disease

## 2016-02-19 VITALS — BP 160/58 | HR 56 | Ht 62.0 in | Wt 190.2 lb

## 2016-02-19 DIAGNOSIS — I779 Disorder of arteries and arterioles, unspecified: Secondary | ICD-10-CM | POA: Diagnosis not present

## 2016-02-19 DIAGNOSIS — E7800 Pure hypercholesterolemia, unspecified: Secondary | ICD-10-CM | POA: Diagnosis not present

## 2016-02-19 DIAGNOSIS — I739 Peripheral vascular disease, unspecified: Secondary | ICD-10-CM

## 2016-02-19 DIAGNOSIS — Z951 Presence of aortocoronary bypass graft: Secondary | ICD-10-CM

## 2016-02-19 DIAGNOSIS — I10 Essential (primary) hypertension: Secondary | ICD-10-CM

## 2016-02-19 MED ORDER — CARVEDILOL 6.25 MG PO TABS: 6.2500 mg | ORAL_TABLET | Freq: Two times a day (BID) | ORAL | 1 refills | Status: DC

## 2016-02-19 NOTE — Progress Notes (Signed)
Cardiology Office Note   Date:  02/19/2016   ID:  Kerri Hicks, [name] 1945-06-14, MRN [id number]  PCP:  Glendon Axe, MD  Cardiologist:   Kathlyn Sacramento, MD   Chief Complaint  Patient presents with  . other    3 month follow up. Meds reviewed by the pt. verbally. Pt. c/o shortness of breath with walking a long distance.       History of Present Illness: Kerri Hicks is a 70 y.o. female who presents for a follow-up visit regarding coronary artery disease status post CABG in June of 2017. She presented with anterior ST elevation myocardial infarction and was found to have significant two-vessel coronary artery disease including ostial LAD with normal ejection fraction. She underwent CABG 2 with LIMA to LAD and SVG to right PDA. She had postoperative atrial fibrillation. She is known to have moderate bilateral carotid disease, hyperlipidemia and previous tobacco use. She has been doing extremely well and denies any chest pain, shortness of breath or palpitations.  She could not attend cardiac rehabilitation due to cost. She gained 13 pounds since August mostly due to sedentary lifestyle.  Past Medical History:  Diagnosis Date  . Diabetes mellitus without complication [facility])     Past Surgical History:  Procedure Laterality Date  . CARDIAC CATHETERIZATION N/A 09/30/2015   Procedure: Left Heart Cath and Coronary Angiography;  Surgeon: Leonie Man, MD;  Location: [place] [facility];  Service: Cardiovascular;  Laterality: N/A;  . CORONARY ARTERY BYPASS GRAFT N/A 09/30/2015   Procedure: CORONARY ARTERY BYPASS GRAFTING (CABG) TIMES TWO USING LEFT INTERNAL MAMMARY ARTERY AND ENDOSCOPICALLY HARVESTED RIGHT SAPHENOUS VEIN GRAFT;  Surgeon: Gaye Pollack, MD;  Location: [place];  Service: Open Heart Surgery;  Laterality: N/A;     Current Outpatient Prescriptions  Medication Sig Dispense Refill  . acetaminophen (TYLENOL) 325 MG tablet Take 2 tablets (650 mg total) by mouth every 6  (six) hours as needed for mild pain.    Marland Kitchen amiodarone (PACERONE) 200 MG tablet Take 200 mg by mouth daily.    Marland Kitchen aspirin EC 325 MG EC tablet Take 1 tablet (325 mg total) by mouth daily. 30 tablet 0  . lisinopril (PRINIVIL,ZESTRIL) 10 MG tablet Take 1 tablet (10 mg total) by mouth daily. 90 tablet 3  . metoprolol tartrate (LOPRESSOR) 25 MG tablet Take 1 tablet (25 mg total) by mouth 2 (two) times daily. 180 tablet 3  . simvastatin (ZOCOR) 20 MG tablet Take 1 tablet (20 mg total) by mouth daily at 6 PM. 90 tablet 3   No current facility-administered medications for this visit.     Allergies:   Penicillins    Social History:  The patient  reports that she quit smoking about 4 months ago. Her smoking use included Cigarettes. She has a 50.00 pack-year smoking history. She has never used smokeless tobacco.   Family History:  The patient's family history includes Heart attack in her mother; Heart disease in her brother and mother; Leukemia in her father.    ROS:  Please see the history of present illness.   Otherwise, review of systems are positive for none.   All other systems are reviewed and negative.    PHYSICAL EXAM: VS:  BP (!) 160/58 (BP Location: Left Arm, Patient Position: Sitting, Cuff Size: Normal)   Pulse (!) 56   Ht 5\' 2"  (1.575 m)   Wt 190 lb 4 oz (86.3 kg)   BMI 34.80 kg/m  , BMI Body  mass index is 34.8 kg/m. GEN: Well nourished, well developed, in no acute distress  HEENT: normal  Neck: no JVD or masses. Bilateral carotid bruits Cardiac: RRR; no rubs, or gallops,no edema . One out of 6 systolic ejection murmur in the aortic area Respiratory:  clear to auscultation bilaterally, normal work of breathing GI: soft, nontender, nondistended, + BS MS: no deformity or atrophy  Skin: warm and dry, no rash Neuro:  Strength and sensation are intact Psych: euthymic mood, full affect   EKG:  EKG is ordered today. The ekg ordered today demonstrates sinus bradycardia with  nonspecific T wave changes.   Recent Labs: 09/30/2015: TSH 0.920 10/01/2015: Magnesium 2.3 10/03/2015: Hemoglobin 8.1; Platelets 176 10/04/2015: BUN 10; Creatinine, Ser 0.84; Potassium 4.1; Sodium 139 12/02/2015: ALT 13    Lipid Panel    Component Value Date/Time   CHOL 147 12/02/2015 0849   TRIG 130 12/02/2015 0849   HDL 51 12/02/2015 0849   CHOLHDL 2.9 12/02/2015 0849   VLDL 26 12/02/2015 0849   LDLCALC 70 12/02/2015 0849      Wt Readings from Last 3 Encounters:  02/19/16 190 lb 4 oz (86.3 kg)  11/28/15 177 lb 4 oz (80.4 kg)  10/22/15 176 lb 9.6 oz (80.1 kg)        ASSESSMENT AND PLAN:  1.  Coronary artery disease involving native coronary arteries without angina: She is overall doing very well with no anginal symptoms. I again discussed with her the importance of increasing his physical activities and healthy diet. I'm concerned about her weight gain over the last few months.  2. Bilateral carotid disease: Carotid Doppler last month showed 40-59% bilateral ICA stenosis. She does have bilateral bruits. Repeat carotid Doppler in July 2018. Continue aggressive treatment of risk factors.  4. Hyperlipidemia: She is currently on simvastatin 20 mg once daily. most recent lipid profile showed an LDL of 70.  4. Essential hypertension: Blood pressure continues to be elevated. I elected to switch metoprolol to carvedilol 6.25 mg twice daily.   Disposition:   FU with me in 3 months  Signed,  Kathlyn Sacramento, MD  02/19/2016 10:38 AM    [place]

## 2016-02-19 NOTE — Patient Instructions (Signed)
Medication Instructions:  Your physician has recommended you make the following change in your medication:  STOP taking metoprolol START taking coreg 6.25mg  twice daily   Labwork: none  Testing/Procedures: none  Follow-Up: Your physician wants you to follow-up in: 4 months with Dr. Fletcher Anon.  You will receive a reminder letter in the mail two months in advance. If you don't receive a letter, please call our office to schedule the follow-up appointment.   Any Other Special Instructions Will Be Listed Below (If Applicable).     If you need a refill on your cardiac medications before your next appointment, please call your pharmacy.

## 2016-03-02 ENCOUNTER — Other Ambulatory Visit: Payer: Self-pay | Admitting: Internal Medicine

## 2016-03-02 DIAGNOSIS — I2510 Atherosclerotic heart disease of native coronary artery without angina pectoris: Secondary | ICD-10-CM | POA: Diagnosis not present

## 2016-03-02 DIAGNOSIS — I6523 Occlusion and stenosis of bilateral carotid arteries: Secondary | ICD-10-CM | POA: Diagnosis not present

## 2016-03-02 DIAGNOSIS — E7800 Pure hypercholesterolemia, unspecified: Secondary | ICD-10-CM | POA: Diagnosis not present

## 2016-03-02 DIAGNOSIS — Z23 Encounter for immunization: Secondary | ICD-10-CM | POA: Diagnosis not present

## 2016-03-02 DIAGNOSIS — I10 Essential (primary) hypertension: Secondary | ICD-10-CM | POA: Diagnosis not present

## 2016-03-02 DIAGNOSIS — Z1239 Encounter for other screening for malignant neoplasm of breast: Secondary | ICD-10-CM

## 2016-03-02 DIAGNOSIS — Z1231 Encounter for screening mammogram for malignant neoplasm of breast: Secondary | ICD-10-CM | POA: Diagnosis not present

## 2016-03-17 ENCOUNTER — Other Ambulatory Visit: Payer: Self-pay

## 2016-04-08 ENCOUNTER — Ambulatory Visit: Payer: Commercial Managed Care - HMO

## 2016-05-18 ENCOUNTER — Ambulatory Visit: Payer: Commercial Managed Care - HMO | Attending: Internal Medicine

## 2016-05-25 ENCOUNTER — Other Ambulatory Visit: Payer: Self-pay

## 2016-05-25 MED ORDER — CARVEDILOL 6.25 MG PO TABS: 6.2500 mg | ORAL_TABLET | Freq: Two times a day (BID) | ORAL | 1 refills | Status: DC

## 2016-06-18 ENCOUNTER — Ambulatory Visit (INDEPENDENT_AMBULATORY_CARE_PROVIDER_SITE_OTHER): Payer: Medicare HMO | Admitting: Cardiovascular Disease

## 2016-06-18 ENCOUNTER — Encounter: Payer: Self-pay | Admitting: Cardiovascular Disease

## 2016-06-18 VITALS — BP 150/72 | HR 67 | Ht 62.0 in | Wt 203.8 lb

## 2016-06-18 DIAGNOSIS — I739 Peripheral vascular disease, unspecified: Secondary | ICD-10-CM

## 2016-06-18 DIAGNOSIS — E785 Hyperlipidemia, unspecified: Secondary | ICD-10-CM | POA: Diagnosis not present

## 2016-06-18 DIAGNOSIS — I10 Essential (primary) hypertension: Secondary | ICD-10-CM

## 2016-06-18 DIAGNOSIS — I779 Disorder of arteries and arterioles, unspecified: Secondary | ICD-10-CM

## 2016-06-18 DIAGNOSIS — I2510 Atherosclerotic heart disease of native coronary artery without angina pectoris: Secondary | ICD-10-CM

## 2016-06-18 MED ORDER — LISINOPRIL 40 MG PO TABS: 40.0000 mg | ORAL_TABLET | Freq: Every day | ORAL | 3 refills | Status: DC

## 2016-06-18 MED ORDER — PANTOPRAZOLE SODIUM 40 MG PO TBEC: 40.0000 mg | DELAYED_RELEASE_TABLET | Freq: Every day | ORAL | 0 refills | Status: AC

## 2016-06-18 NOTE — Patient Instructions (Signed)
Medication Instructions:  Your physician has recommended you make the following change in your medication:  INCREASE lisinopril to 40mg  once daily START taking protonix 40mg  once daily for one month   Labwork: none  Testing/Procedures: Your physician has requested that you have a carotid duplex in July. This test is an ultrasound of the carotid arteries in your neck. It looks at blood flow through these arteries that supply the brain with blood. Allow one hour for this exam. There are no restrictions or special instructions.    Follow-Up: Your physician wants you to follow-up in: 6 months.  You will receive a reminder letter in the mail two months in advance. If you don't receive a letter, please call our office to schedule the follow-up appointment.   Any Other Special Instructions Will Be Listed Below (If Applicable).     If you need a refill on your cardiac medications before your next appointment, please call your pharmacy.

## 2016-06-18 NOTE — Progress Notes (Signed)
[id number]    Cardiology Office Note   Date:  06/18/2016   ID:  JAMALIA WEARE, [name] January 17, 1946, MRN [id number]  PCP:  Glendon Axe, MD  Cardiologist:   Kathlyn Sacramento, MD   Chief Complaint  Patient presents with  . other    3 month f/u no complaints today. Meds reviewed verbally with pt.      History of Present Illness: Kerri Hicks is a 71 y.o. female who presents for a follow-up visit regarding coronary artery disease status post CABG in June of 2017. She presented with anterior ST elevation myocardial infarction and was found to have significant two-vessel coronary artery disease including ostial LAD with normal ejection fraction. She underwent CABG 2 with LIMA to LAD and SVG to right PDA. She had postoperative atrial fibrillation. She is known to have moderate bilateral carotid disease, hyperlipidemia and previous tobacco use. She could not attend cardiac rehabilitation due to cost. She reports some chest pain at the surgical incision with certain activities but overall no convincing symptoms of angina. She does complain of increased heartburn. Blood pressure continues to be elevated. She continues to gain weight in spite of trying to pay more attention to her diet.   Past Medical History:  Diagnosis Date  . Carotid artery occlusion   . Coronary artery disease    CABG in 09/2015 after anterior STEMI with ostial LAD disease (LIMA to LAD and SVG to RPDA)  . Diabetes mellitus without complication ([place])   . Hyperlipidemia   . Hypertension     Past Surgical History:  Procedure Laterality Date  . CARDIAC CATHETERIZATION N/A 09/30/2015   Procedure: Left Heart Cath and Coronary Angiography;  Surgeon: Leonie Man, MD;  Location: [place] [facility];  Service: Cardiovascular;  Laterality: N/A;  . CORONARY ARTERY BYPASS GRAFT N/A 09/30/2015   Procedure: CORONARY ARTERY BYPASS GRAFTING (CABG) TIMES TWO USING LEFT INTERNAL MAMMARY ARTERY AND ENDOSCOPICALLY HARVESTED RIGHT  SAPHENOUS VEIN GRAFT;  Surgeon: Gaye Pollack, MD;  Location: [place];  Service: Open Heart Surgery;  Laterality: N/A;     Current Outpatient Prescriptions  Medication Sig Dispense Refill  . acetaminophen (TYLENOL) 325 MG tablet Take 2 tablets (650 mg total) by mouth every 6 (six) hours as needed for mild pain.    Marland Kitchen aspirin EC 81 MG tablet Take 81 mg by mouth daily.    . carvedilol (COREG) 6.25 MG tablet Take 1 tablet (6.25 mg total) by mouth 2 (two) times daily. 180 tablet 1  . simvastatin (ZOCOR) 20 MG tablet Take 1 tablet (20 mg total) by mouth daily at 6 PM. 90 tablet 3  . lisinopril (PRINIVIL,ZESTRIL) 40 MG tablet Take 1 tablet (40 mg total) by mouth daily. 90 tablet 3   No current facility-administered medications for this visit.     Allergies:   Penicillins    Social History:  The patient  reports that she quit smoking about 8 months ago. Her smoking use included Cigarettes. She has a 50.00 pack-year smoking history. She has never used smokeless tobacco.   Family History:  The patient's family history includes Heart attack in her mother; Heart disease in her brother and mother; Leukemia in her father.    ROS:  Please see the history of present illness.   Otherwise, review of systems are positive for none.   All other systems are reviewed and negative.    PHYSICAL EXAM: VS:  BP (!) 150/72 (BP Location: Left Arm, Patient Position: Sitting, Cuff Size: Large)  Pulse 67   Ht 5\' 2"  (1.575 m)   Wt 203 lb 12 oz (92.4 kg)   BMI 37.27 kg/m  , BMI Body mass index is 37.27 kg/m. GEN: Well nourished, well developed, in no acute distress  HEENT: normal  Neck: no JVD or masses. Bilateral carotid bruits Cardiac: RRR; no rubs, or gallops,no edema . One out of 6 systolic ejection murmur in the aortic area Respiratory:  clear to auscultation bilaterally, normal work of breathing GI: soft, nontender, nondistended, + BS MS: no deformity or atrophy  Skin: warm and dry, no rash Neuro:   Strength and sensation are intact Psych: euthymic mood, full affect   EKG:  EKG is ordered today. The ekg ordered today demonstrates Normal sinus rhythm with nonspecific T wave changes.   Recent Labs: 09/30/2015: TSH 0.920 10/01/2015: Magnesium 2.3 10/03/2015: Hemoglobin 8.1; Platelets 176 10/04/2015: BUN 10; Creatinine, Ser 0.84; Potassium 4.1; Sodium 139 12/02/2015: ALT 13    Lipid Panel    Component Value Date/Time   CHOL 147 12/02/2015 0849   TRIG 130 12/02/2015 0849   HDL 51 12/02/2015 0849   CHOLHDL 2.9 12/02/2015 0849   VLDL 26 12/02/2015 0849   LDLCALC 70 12/02/2015 0849      Wt Readings from Last 3 Encounters:  06/18/16 203 lb 12 oz (92.4 kg)  02/19/16 190 lb 4 oz (86.3 kg)  11/28/15 177 lb 4 oz (80.4 kg)        ASSESSMENT AND PLAN:  1.  Coronary artery disease involving native coronary arteries without angina: She is overall doing very well with no anginal symptoms.  Continue medical therapy  2. Bilateral carotid disease: Carotid Doppler last month showed 40-59% bilateral ICA stenosis. She does have bilateral bruits. Repeat carotid Doppler in July 2018.   3. Hyperlipidemia: She is currently on simvastatin 20 mg once daily. most recent lipid profile showed an LDL of 70.  4. Essential hypertension: Blood pressure continues to be elevated. I increased lisinopril to 40 mg once daily.  5. GERD: I prescribed Protonix 40 mg once daily for one month.  6. Obesity: She continues to gain weight. She is going to follow-up with her primary care physician. I recommend checking TSH and possible referral for nutritional consult.  Disposition:   FU with me in 6 months  Signed,  Kathlyn Sacramento, MD  06/18/2016 10:39 AM    [place]

## 2016-07-01 DIAGNOSIS — I6523 Occlusion and stenosis of bilateral carotid arteries: Secondary | ICD-10-CM | POA: Diagnosis not present

## 2016-07-01 DIAGNOSIS — Z1231 Encounter for screening mammogram for malignant neoplasm of breast: Secondary | ICD-10-CM | POA: Diagnosis not present

## 2016-07-01 DIAGNOSIS — Z131 Encounter for screening for diabetes mellitus: Secondary | ICD-10-CM | POA: Diagnosis not present

## 2016-07-01 DIAGNOSIS — E7800 Pure hypercholesterolemia, unspecified: Secondary | ICD-10-CM | POA: Diagnosis not present

## 2016-07-01 DIAGNOSIS — I10 Essential (primary) hypertension: Secondary | ICD-10-CM | POA: Diagnosis not present

## 2016-07-01 DIAGNOSIS — K219 Gastro-esophageal reflux disease without esophagitis: Secondary | ICD-10-CM | POA: Diagnosis not present

## 2016-07-01 DIAGNOSIS — Z23 Encounter for immunization: Secondary | ICD-10-CM | POA: Diagnosis not present

## 2016-07-07 ENCOUNTER — Other Ambulatory Visit: Payer: Self-pay | Admitting: Internal Medicine

## 2016-07-07 DIAGNOSIS — N289 Disorder of kidney and ureter, unspecified: Secondary | ICD-10-CM

## 2016-07-09 ENCOUNTER — Ambulatory Visit
Admission: RE | Admit: 2016-07-09 | Discharge: 2016-07-09 | Disposition: A | Discharge status: Home / Self Care | Payer: Medicare HMO | Source: Ambulatory Visit | Attending: Internal Medicine | Admitting: Internal Medicine

## 2016-07-09 DIAGNOSIS — N289 Disorder of kidney and ureter, unspecified: Secondary | ICD-10-CM | POA: Diagnosis not present

## 2016-07-09 DIAGNOSIS — N2889 Other specified disorders of kidney and ureter: Secondary | ICD-10-CM | POA: Diagnosis not present

## 2016-09-15 DIAGNOSIS — N289 Disorder of kidney and ureter, unspecified: Secondary | ICD-10-CM | POA: Diagnosis not present

## 2016-10-01 DIAGNOSIS — N183 Chronic kidney disease, stage 3 (moderate): Secondary | ICD-10-CM | POA: Diagnosis not present

## 2016-10-01 DIAGNOSIS — Z0000 Encounter for general adult medical examination without abnormal findings: Secondary | ICD-10-CM | POA: Diagnosis not present

## 2016-10-01 DIAGNOSIS — M810 Age-related osteoporosis without current pathological fracture: Secondary | ICD-10-CM | POA: Diagnosis not present

## 2016-10-01 DIAGNOSIS — R7303 Prediabetes: Secondary | ICD-10-CM | POA: Diagnosis not present

## 2016-10-01 DIAGNOSIS — Z1231 Encounter for screening mammogram for malignant neoplasm of breast: Secondary | ICD-10-CM | POA: Diagnosis not present

## 2016-10-12 ENCOUNTER — Other Ambulatory Visit: Payer: Self-pay | Admitting: Internal Medicine

## 2016-10-12 DIAGNOSIS — M8588 Other specified disorders of bone density and structure, other site: Secondary | ICD-10-CM | POA: Diagnosis not present

## 2016-10-12 DIAGNOSIS — Z1231 Encounter for screening mammogram for malignant neoplasm of breast: Secondary | ICD-10-CM

## 2016-10-16 ENCOUNTER — Other Ambulatory Visit: Payer: Self-pay | Admitting: Cardiovascular Disease

## 2016-10-22 ENCOUNTER — Ambulatory Visit: Payer: Medicare HMO

## 2016-10-22 DIAGNOSIS — I739 Peripheral vascular disease, unspecified: Principal | ICD-10-CM

## 2016-10-22 DIAGNOSIS — I779 Disorder of arteries and arterioles, unspecified: Secondary | ICD-10-CM

## 2016-10-22 DIAGNOSIS — I6523 Occlusion and stenosis of bilateral carotid arteries: Secondary | ICD-10-CM | POA: Diagnosis not present

## 2016-10-22 LAB — VAS US CAROTID
LCCAPDIAS: 24 cm/s
LCCAPSYS: 170 cm/s
LEFT ECA DIAS: -36 cm/s
LEFT VERTEBRAL DIAS: -18 cm/s
LICADDIAS: -27 cm/s
LICAPSYS: 162 cm/s
Left CCA dist dias: -22 cm/s
Left CCA dist sys: -116 cm/s
Left ICA dist sys: -160 cm/s
Left ICA prox dias: 28 cm/s
RCCADSYS: -106 cm/s
RCCAPDIAS: 23 cm/s
RIGHT ECA DIAS: 18 cm/s
RIGHT VERTEBRAL DIAS: 13 cm/s
Right CCA prox sys: 127 cm/s

## 2016-10-25 ENCOUNTER — Other Ambulatory Visit: Payer: Self-pay

## 2016-10-25 DIAGNOSIS — I6523 Occlusion and stenosis of bilateral carotid arteries: Secondary | ICD-10-CM

## 2016-10-28 ENCOUNTER — Ambulatory Visit
Admission: RE | Admit: 2016-10-28 | Discharge: 2016-10-28 | Disposition: A | Discharge status: Home / Self Care | Payer: Medicare HMO | Source: Ambulatory Visit | Attending: Internal Medicine | Admitting: Internal Medicine

## 2016-10-28 DIAGNOSIS — Z1231 Encounter for screening mammogram for malignant neoplasm of breast: Secondary | ICD-10-CM | POA: Diagnosis not present

## 2016-12-16 ENCOUNTER — Encounter: Payer: Self-pay | Admitting: Cardiovascular Disease

## 2016-12-16 ENCOUNTER — Ambulatory Visit: Payer: Self-pay | Admitting: Cardiovascular Disease

## 2016-12-16 ENCOUNTER — Ambulatory Visit (INDEPENDENT_AMBULATORY_CARE_PROVIDER_SITE_OTHER): Payer: Medicare HMO | Admitting: Cardiovascular Disease

## 2016-12-16 VITALS — BP 132/68 | HR 56 | Ht 59.0 in | Wt 193.5 lb

## 2016-12-16 DIAGNOSIS — I10 Essential (primary) hypertension: Secondary | ICD-10-CM | POA: Diagnosis not present

## 2016-12-16 DIAGNOSIS — E785 Hyperlipidemia, unspecified: Secondary | ICD-10-CM

## 2016-12-16 DIAGNOSIS — I25118 Atherosclerotic heart disease of native coronary artery with other forms of angina pectoris: Secondary | ICD-10-CM

## 2016-12-16 DIAGNOSIS — I739 Peripheral vascular disease, unspecified: Secondary | ICD-10-CM

## 2016-12-16 DIAGNOSIS — I779 Disorder of arteries and arterioles, unspecified: Secondary | ICD-10-CM | POA: Diagnosis not present

## 2016-12-16 MED ORDER — ISOSORBIDE MONONITRATE ER 30 MG PO TB24: 30.0000 mg | ORAL_TABLET | Freq: Every day | ORAL | 2 refills | Status: DC

## 2016-12-16 MED ORDER — ISOSORBIDE MONONITRATE ER 30 MG PO TB24: 30.0000 mg | ORAL_TABLET | Freq: Every day | ORAL | 0 refills | Status: DC

## 2016-12-16 NOTE — Progress Notes (Signed)
[id number]    Cardiology Office Note   Date:  12/16/2016   ID:  Kerri Hicks, Kerri Hicks [birth year], MRN [id number]  PCP:  Glendon Axe, MD  Cardiologist:   Kathlyn Sacramento, MD   Chief Complaint  Patient presents with  . other    6 month follow up. Meds reviewed by the pt. verbally. Pt. c/o chest pain with exertion.       History of Present Illness: Kerri Hicks is a 71 y.o. female who presents for a follow-up visit regarding coronary artery disease status post CABG in June of 2017. She presented with anterior ST elevation myocardial infarction and was found to have significant two-vessel coronary artery disease including ostial LAD with normal ejection fraction. She underwent CABG 2 with LIMA to LAD and SVG to right PDA. She had postoperative atrial fibrillation. She is known to have moderate bilateral carotid disease, hyperlipidemia and previous tobacco use. She could not attend cardiac rehabilitation due to cost. She lost 10 pounds since last visit. She complains of mild substernal chest tightness with over exertion but not with regular activities. No shortness of breath. She is taking her medications regularly.  Past Medical History:  Diagnosis Date  . Carotid artery occlusion   . Coronary artery disease    CABG in 09/2015 after anterior STEMI with ostial LAD disease (LIMA to LAD and SVG to RPDA)  . Diabetes mellitus without complication ([place])   . Hyperlipidemia   . Hypertension     Past Surgical History:  Procedure Laterality Date  . CARDIAC CATHETERIZATION N/A 09/30/2015   Procedure: Left Heart Cath and Coronary Angiography;  Surgeon: Leonie Man, MD;  Location: [place] [facility];  Service: Cardiovascular;  Laterality: N/A;  . CORONARY ARTERY BYPASS GRAFT N/A 09/30/2015   Procedure: CORONARY ARTERY BYPASS GRAFTING (CABG) TIMES TWO USING LEFT INTERNAL MAMMARY ARTERY AND ENDOSCOPICALLY HARVESTED RIGHT SAPHENOUS VEIN GRAFT;  Surgeon: Gaye Pollack, MD;  Location: [place];   Service: Open Heart Surgery;  Laterality: N/A;     Current Outpatient Prescriptions  Medication Sig Dispense Refill  . acetaminophen (TYLENOL) 325 MG tablet Take 2 tablets (650 mg total) by mouth every 6 (six) hours as needed for mild pain.    Marland Kitchen aspirin EC 81 MG tablet Take 81 mg by mouth daily.    . Calcium 600-200 MG-UNIT tablet Take 1 tablet by mouth 2 (two) times daily.    . carvedilol (COREG) 6.25 MG tablet TAKE 1 TABLET TWICE DAILY  (DISCONTINUE METOPROLOL) 180 tablet 1  . lisinopril (PRINIVIL,ZESTRIL) 40 MG tablet Take 1 tablet (40 mg total) by mouth daily. 90 tablet 3  . pantoprazole (PROTONIX) 40 MG tablet Take 1 tablet (40 mg total) by mouth daily. 30 tablet 0  . simvastatin (ZOCOR) 20 MG tablet TAKE 1 TABLET (20 MG TOTAL) BY MOUTH DAILY AT 6 PM. 90 tablet 3   No current facility-administered medications for this visit.     Allergies:   Penicillins    Social History:  The patient  reports that she quit smoking about 14 months ago. Her smoking use included Cigarettes. She has a 50.00 pack-year smoking history. She has never used smokeless tobacco.   Family History:  The patient's family history includes Heart attack in her mother; Heart disease in her brother and mother; Leukemia in her father.    ROS:  Please see the history of present illness.   Otherwise, review of systems are positive for none.   All other systems are  reviewed and negative.    PHYSICAL EXAM: VS:  BP 132/68 (BP Location: Left Arm, Patient Position: Sitting, Cuff Size: Normal)   Pulse (!) 56   Ht 4\' 11"  (1.499 m)   Wt 193 lb 8 oz (87.8 kg)   BMI 39.08 kg/m  , BMI Body mass index is 39.08 kg/m. GEN: Well nourished, well developed, in no acute distress  HEENT: normal  Neck: no JVD or masses. Bilateral carotid bruits Cardiac: RRR; no rubs, or gallops,no edema . One out of 6 systolic ejection murmur in the aortic area Respiratory:  clear to auscultation bilaterally, normal work of breathing GI: soft,  nontender, nondistended, + BS MS: no deformity or atrophy  Skin: warm and dry, no rash Neuro:  Strength and sensation are intact Psych: euthymic mood, full affect   EKG:  EKG is ordered today. The ekg ordered today demonstrates Sinus bradycardia with no significant ST or T wave changes.    Recent Labs: No results found for requested labs within last 8760 hours.    Lipid Panel    Component Value Date/Time   CHOL 147 12/02/2015 0849   TRIG 130 12/02/2015 0849   HDL 51 12/02/2015 0849   CHOLHDL 2.9 12/02/2015 0849   VLDL 26 12/02/2015 0849   LDLCALC 70 12/02/2015 0849      Wt Readings from Last 3 Encounters:  12/16/16 193 lb 8 oz (87.8 kg)  06/18/16 203 lb 12 oz (92.4 kg)  02/19/16 190 lb 4 oz (86.3 kg)        ASSESSMENT AND PLAN:  1.  Coronary artery disease involving native coronary arteries With stable angina: The patient's current symptoms are suggestive of class II angina. I elected to add Imdur 30 mg once daily. If no improvement in symptoms, I would consider a nuclear stress test. EKG today does not show any acute changes.   2. Bilateral carotid disease: Carotid Doppler last month showed stable moderate disease. Repeat study in one year.   3. Hyperlipidemia: She is currently on simvastatin 20 mg once daily. most recent lipid profile showed an LDL of 70.  4. Essential Hypertension: Blood pressure is well controlled on current medications.   5. GERD: I prescribed Protonix 40 mg once daily for one month.   Disposition:   FU with me in 6 months  Signed,  Kathlyn Sacramento, MD  12/16/2016 1:06 PM    [place]

## 2016-12-16 NOTE — Patient Instructions (Signed)
Medication Instructions:  Your physician has recommended you make the following change in your medication:  START taking Imdur 30mg  once daily. A 30-day supply has been sent to your local [place]. Refills may be obtained from your mail order pharmacy beginning Sept 30, 2018.    Labwork: none  Testing/Procedures: none  Follow-Up: Your physician wants you to follow-up in: 6 months with Dr. Fletcher Anon.  You will receive a reminder letter in the mail two months in advance. If you don't receive a letter, please call our office to schedule the follow-up appointment.   Any Other Special Instructions Will Be Listed Below (If Applicable).     If you need a refill on your cardiac medications before your next appointment, please call your pharmacy.

## 2017-01-05 DIAGNOSIS — R7303 Prediabetes: Secondary | ICD-10-CM | POA: Diagnosis not present

## 2017-01-12 DIAGNOSIS — D649 Anemia, unspecified: Secondary | ICD-10-CM | POA: Diagnosis not present

## 2017-01-12 DIAGNOSIS — N183 Chronic kidney disease, stage 3 (moderate): Secondary | ICD-10-CM | POA: Diagnosis not present

## 2017-01-12 DIAGNOSIS — K219 Gastro-esophageal reflux disease without esophagitis: Secondary | ICD-10-CM | POA: Diagnosis not present

## 2017-01-12 DIAGNOSIS — I10 Essential (primary) hypertension: Secondary | ICD-10-CM | POA: Diagnosis not present

## 2017-01-12 DIAGNOSIS — E7800 Pure hypercholesterolemia, unspecified: Secondary | ICD-10-CM | POA: Diagnosis not present

## 2017-01-14 ENCOUNTER — Other Ambulatory Visit: Payer: Self-pay | Admitting: Cardiovascular Disease

## 2017-01-14 ENCOUNTER — Other Ambulatory Visit: Payer: Self-pay

## 2017-01-14 ENCOUNTER — Telehealth: Payer: Self-pay | Admitting: Cardiovascular Disease

## 2017-01-14 MED ORDER — ISOSORBIDE MONONITRATE ER 30 MG PO TB24: 30.0000 mg | ORAL_TABLET | Freq: Every day | ORAL | 3 refills | Status: DC

## 2017-01-14 NOTE — Telephone Encounter (Signed)
°*  STAT* If patient is at the pharmacy, call can be transferred to refill team.   1. Which medications need to be refilled? (please list name of each medication and dose if known)  Isosorbide 30 mg   2. Which pharmacy/location (including street and city if local pharmacy) is medication to be sent to? [facility] order   3. Do they need a 30 day or 90 day supply? 90 day

## 2017-01-14 NOTE — Telephone Encounter (Signed)
Refill sent for 90 day supply of Isosorbide mono 30 mg to [facility] as well as called in refill for 30 day supply to [place] [facility] for Isosorbide mono 30 mg until the mail order arrives.

## 2017-01-21 DIAGNOSIS — D649 Anemia, unspecified: Secondary | ICD-10-CM | POA: Diagnosis not present

## 2017-02-14 ENCOUNTER — Other Ambulatory Visit: Payer: Self-pay

## 2017-02-14 ENCOUNTER — Telehealth: Payer: Self-pay | Admitting: Cardiovascular Disease

## 2017-02-14 MED ORDER — ISOSORBIDE MONONITRATE ER 30 MG PO TB24: 30.0000 mg | ORAL_TABLET | Freq: Every day | ORAL | 0 refills | Status: DC

## 2017-02-14 NOTE — Telephone Encounter (Signed)
Sent in 2 weeks worth of Isosorbide until her mail order comes in.  Requested Prescriptions   Signed Prescriptions Disp Refills  . isosorbide mononitrate (IMDUR) 30 MG 24 hr tablet 14 tablet 0    Sig: Take 1 tablet (30 mg total) by mouth daily.    Authorizing Provider: Kathlyn Sacramento A    Ordering User: Janan Ridge

## 2017-02-14 NOTE — Telephone Encounter (Signed)
Requested Prescriptions   Signed Prescriptions Disp Refills  . isosorbide mononitrate (IMDUR) 30 MG 24 hr tablet 14 tablet 0    Sig: Take 1 tablet (30 mg total) by mouth daily.    Authorizing Provider: Kathlyn Sacramento A    Ordering User: Janan Ridge

## 2017-02-14 NOTE — Telephone Encounter (Signed)
[facility] pharmacy calling stating they are sending out prescription on Isosorbide, but won't get it in time for patient.  She only has one pill left.  They are asking if we can please send to her local pharmacy 2 weeks worth  [facility] on [address]   Please advise

## 2017-02-14 NOTE — Telephone Encounter (Signed)
Pt prescribed imdur 30mg  at August OV. One month supply was sent locally to [place] w/refills to [place] per pt request. I s/w pt and clarified she is speaking of imdur, not nitroglycerin. Reviewed where prescription was sent. She has not called [employer] for refills as she didn't know if she should call them or our office. Pt will call mail order pharmacy for Imdur and call back if she encounters any problems. She is appreciative of the help.

## 2017-02-14 NOTE — Telephone Encounter (Signed)
Pt states she needs a rx for Nitroglycerin. Please call.

## 2017-02-18 ENCOUNTER — Other Ambulatory Visit: Payer: Self-pay

## 2017-02-18 MED ORDER — CARVEDILOL 6.25 MG PO TABS: ORAL_TABLET | ORAL | 3 refills | Status: DC

## 2017-04-05 ENCOUNTER — Other Ambulatory Visit: Payer: Self-pay | Admitting: Cardiovascular Disease

## 2017-04-13 DIAGNOSIS — N183 Chronic kidney disease, stage 3 (moderate): Secondary | ICD-10-CM | POA: Diagnosis not present

## 2017-04-13 DIAGNOSIS — D649 Anemia, unspecified: Secondary | ICD-10-CM | POA: Diagnosis not present

## 2017-05-06 DIAGNOSIS — M19011 Primary osteoarthritis, right shoulder: Secondary | ICD-10-CM | POA: Diagnosis not present

## 2017-05-06 DIAGNOSIS — E7800 Pure hypercholesterolemia, unspecified: Secondary | ICD-10-CM | POA: Diagnosis not present

## 2017-05-06 DIAGNOSIS — K219 Gastro-esophageal reflux disease without esophagitis: Secondary | ICD-10-CM | POA: Diagnosis not present

## 2017-05-06 DIAGNOSIS — R7303 Prediabetes: Secondary | ICD-10-CM | POA: Diagnosis not present

## 2017-05-06 DIAGNOSIS — I10 Essential (primary) hypertension: Secondary | ICD-10-CM | POA: Diagnosis not present

## 2017-05-06 DIAGNOSIS — M19012 Primary osteoarthritis, left shoulder: Secondary | ICD-10-CM | POA: Diagnosis not present

## 2017-07-28 ENCOUNTER — Encounter: Payer: Self-pay | Admitting: Cardiovascular Disease

## 2017-07-28 ENCOUNTER — Ambulatory Visit: Payer: Medicare HMO | Admitting: Cardiovascular Disease

## 2017-07-28 VITALS — BP 144/70 | HR 59 | Ht 60.0 in | Wt 198.5 lb

## 2017-07-28 DIAGNOSIS — I25118 Atherosclerotic heart disease of native coronary artery with other forms of angina pectoris: Secondary | ICD-10-CM | POA: Diagnosis not present

## 2017-07-28 DIAGNOSIS — E785 Hyperlipidemia, unspecified: Secondary | ICD-10-CM | POA: Diagnosis not present

## 2017-07-28 DIAGNOSIS — I10 Essential (primary) hypertension: Secondary | ICD-10-CM

## 2017-07-28 DIAGNOSIS — I779 Disorder of arteries and arterioles, unspecified: Secondary | ICD-10-CM

## 2017-07-28 DIAGNOSIS — I739 Peripheral vascular disease, unspecified: Secondary | ICD-10-CM

## 2017-07-28 MED ORDER — AMLODIPINE BESYLATE 5 MG PO TABS: 5.0000 mg | ORAL_TABLET | Freq: Every day | ORAL | 3 refills | Status: DC

## 2017-07-28 NOTE — Patient Instructions (Signed)
Medication Instructions:  Your physician has recommended you make the following change in your medication:  STOP taking imdur START amlodipine 5mg  once daily   Labwork: none  Testing/Procedures: none  Follow-Up: Your physician wants you to follow-up in: 6 months with Dr. Fletcher Anon.  You will receive a reminder letter in the mail two months in advance. If you don't receive a letter, please call our office to schedule the follow-up appointment.   Any Other Special Instructions Will Be Listed Below (If Applicable).     If you need a refill on your cardiac medications before your next appointment, please call your pharmacy.

## 2017-07-28 NOTE — Progress Notes (Signed)
[id number]    Cardiology Office Note   Date:  07/28/2017   ID:  Kerri Hicks, [name] Apr 22, 1945, MRN [id number]  PCP:  Glendon Axe, MD  Cardiologist:   Kathlyn Sacramento, MD   Chief Complaint  Patient presents with  . other    6 month follow up. Meds reviewed by the pt. verbally. Pt. c/o chest pain when walking.       History of Present Illness: Kerri Hicks is a 72 y.o. female who presents for a follow-up visit regarding coronary artery disease status post CABG in June of 2017. She presented with anterior ST elevation myocardial infarction and was found to have significant two-vessel coronary artery disease including ostial LAD with normal ejection fraction. She underwent CABG 2 with LIMA to LAD and SVG to right PDA. She had postoperative atrial fibrillation. She is known to have moderate bilateral carotid disease, hyperlipidemia and previous tobacco use. During last visit, she complained of mild exertional chest pain.  I added Imdur 30 mg once daily.  She reports no change in her symptoms.  No shortness of breath, palpitations or leg edema.  She is taking her medications regularly.  Past Medical History:  Diagnosis Date  . Carotid artery occlusion   . Coronary artery disease    CABG in 09/2015 after anterior STEMI with ostial LAD disease (LIMA to LAD and SVG to RPDA)  . Diabetes mellitus without complication ([place])   . Hyperlipidemia   . Hypertension     Past Surgical History:  Procedure Laterality Date  . CARDIAC CATHETERIZATION N/A 09/30/2015   Procedure: Left Heart Cath and Coronary Angiography;  Surgeon: Leonie Man, MD;  Location: [facility];  Service: Cardiovascular;  Laterality: N/A;  . CORONARY ARTERY BYPASS GRAFT N/A 09/30/2015   Procedure: CORONARY ARTERY BYPASS GRAFTING (CABG) TIMES TWO USING LEFT INTERNAL MAMMARY ARTERY AND ENDOSCOPICALLY HARVESTED RIGHT SAPHENOUS VEIN GRAFT;  Surgeon: Gaye Pollack, MD;  Location: [facility];  Service: Open Heart Surgery;   Laterality: N/A;     Current Outpatient Medications  Medication Sig Dispense Refill  . acetaminophen (TYLENOL) 325 MG tablet Take 2 tablets (650 mg total) by mouth every 6 (six) hours as needed for mild pain.    Marland Kitchen aspirin EC 81 MG tablet Take 81 mg by mouth daily.    . Calcium 600-200 MG-UNIT tablet Take 1 tablet by mouth 2 (two) times daily.    . carvedilol (COREG) 6.25 MG tablet TAKE 1 TABLET TWICE DAILY  (DISCONTINUE METOPROLOL) 180 tablet 3  . isosorbide mononitrate (IMDUR) 30 MG 24 hr tablet Take 1 tablet (30 mg total) by mouth daily. 14 tablet 0  . lisinopril (PRINIVIL,ZESTRIL) 40 MG tablet TAKE 1 TABLET ONE TIME DAILY 90 tablet 3  . pantoprazole (PROTONIX) 40 MG tablet Take 1 tablet (40 mg total) by mouth daily. 30 tablet 0  . simvastatin (ZOCOR) 20 MG tablet TAKE 1 TABLET (20 MG TOTAL) BY MOUTH DAILY AT 6 PM. 90 tablet 3   No current facility-administered medications for this visit.     Allergies:   Penicillins and Penicillin g    Social History:  The patient  reports that she quit smoking about 21 months ago. Her smoking use included cigarettes. She has a 50.00 pack-year smoking history. She has never used smokeless tobacco.   Family History:  The patient's family history includes Heart attack in her mother; Heart disease in her brother and mother; Leukemia in her father.    ROS:  Please see the history of present illness.   Otherwise, review of systems are positive for none.   All other systems are reviewed and negative.    PHYSICAL EXAM: VS:  BP (!) 144/70 (BP Location: Left Arm, Patient Position: Sitting, Cuff Size: Normal)   Pulse (!) 59   Ht 5' (1.524 m)   Wt 198 lb 8 oz (90 kg)   BMI 38.77 kg/m  , BMI Body mass index is 38.77 kg/m. GEN: Well nourished, well developed, in no acute distress  HEENT: normal  Neck: no JVD or masses. Bilateral carotid bruits Cardiac: RRR; no rubs, or gallops,no edema . 2/ 6 systolic ejection murmur in the aortic area Respiratory:   clear to auscultation bilaterally, normal work of breathing GI: soft, nontender, nondistended, + BS MS: no deformity or atrophy  Skin: warm and dry, no rash Neuro:  Strength and sensation are intact Psych: euthymic mood, full affect   EKG:  EKG is ordered today. The ekg ordered today demonstrates normal sinus rhythm with no significant ST or T wave changes.   Recent Labs: No results found for requested labs within last 8760 hours.    Lipid Panel    Component Value Date/Time   CHOL 147 12/02/2015 0849   TRIG 130 12/02/2015 0849   HDL 51 12/02/2015 0849   CHOLHDL 2.9 12/02/2015 0849   VLDL 26 12/02/2015 0849   LDLCALC 70 12/02/2015 0849      Wt Readings from Last 3 Encounters:  07/28/17 198 lb 8 oz (90 kg)  12/16/16 193 lb 8 oz (87.8 kg)  06/18/16 203 lb 12 oz (92.4 kg)        ASSESSMENT AND PLAN:  1.  Coronary artery disease involving native coronary arteries with stable angina:  No significant change in with symptoms with the addition of isosorbide.  Symptoms are suggestive of class II angina. I suggested proceeding with a nuclear stress test but she reports that her symptoms are overall mild and do not interfere with her activities of daily living.  Continue to monitor for now. I elected to discontinue isosorbide and add amlodipine 5 mg once daily especially that her blood pressures continues to be mildly elevated.   2. Bilateral carotid disease: Carotid Doppler last July showed moderate disease.  She is due for repeat test in July of this year.  3. Hyperlipidemia: She is currently on simvastatin 20 mg once daily.  Most recent lipid profile in March 2018 showed an LDL of 70.  4. Essential Hypertension: Blood pressure is mildly elevated.  I discontinued isosorbide and added amlodipine 5 mg once daily.    Disposition:   FU with me in 6 months  Signed,  Kathlyn Sacramento, MD  07/28/2017 10:33 AM    [place]

## 2017-08-18 ENCOUNTER — Other Ambulatory Visit: Payer: Self-pay | Admitting: Cardiovascular Disease

## 2017-09-27 DIAGNOSIS — E119 Type 2 diabetes mellitus without complications: Secondary | ICD-10-CM | POA: Diagnosis not present

## 2017-09-27 DIAGNOSIS — Z0100 Encounter for examination of eyes and vision without abnormal findings: Secondary | ICD-10-CM | POA: Diagnosis not present

## 2017-09-29 DIAGNOSIS — R7303 Prediabetes: Secondary | ICD-10-CM | POA: Diagnosis not present

## 2017-09-29 DIAGNOSIS — I10 Essential (primary) hypertension: Secondary | ICD-10-CM | POA: Diagnosis not present

## 2017-10-06 DIAGNOSIS — M810 Age-related osteoporosis without current pathological fracture: Secondary | ICD-10-CM | POA: Diagnosis not present

## 2017-10-06 DIAGNOSIS — Z0000 Encounter for general adult medical examination without abnormal findings: Secondary | ICD-10-CM | POA: Diagnosis not present

## 2017-10-06 DIAGNOSIS — E119 Type 2 diabetes mellitus without complications: Secondary | ICD-10-CM | POA: Diagnosis not present

## 2017-12-30 DIAGNOSIS — E119 Type 2 diabetes mellitus without complications: Secondary | ICD-10-CM | POA: Diagnosis not present

## 2018-01-06 DIAGNOSIS — E875 Hyperkalemia: Secondary | ICD-10-CM | POA: Diagnosis not present

## 2018-01-06 DIAGNOSIS — E119 Type 2 diabetes mellitus without complications: Secondary | ICD-10-CM | POA: Diagnosis not present

## 2018-01-06 DIAGNOSIS — N183 Chronic kidney disease, stage 3 (moderate): Secondary | ICD-10-CM | POA: Diagnosis not present

## 2018-01-20 IMAGING — DX DG CHEST 1V PORT
1 series · 1 of 1 positions shown · non-contrast
Comparison: None.

CLINICAL DATA: Chest pain since midnight radiating down both arms.
Diaphoresis and nausea.

EXAM:
PORTABLE CHEST 1 VIEW

[chest ap]
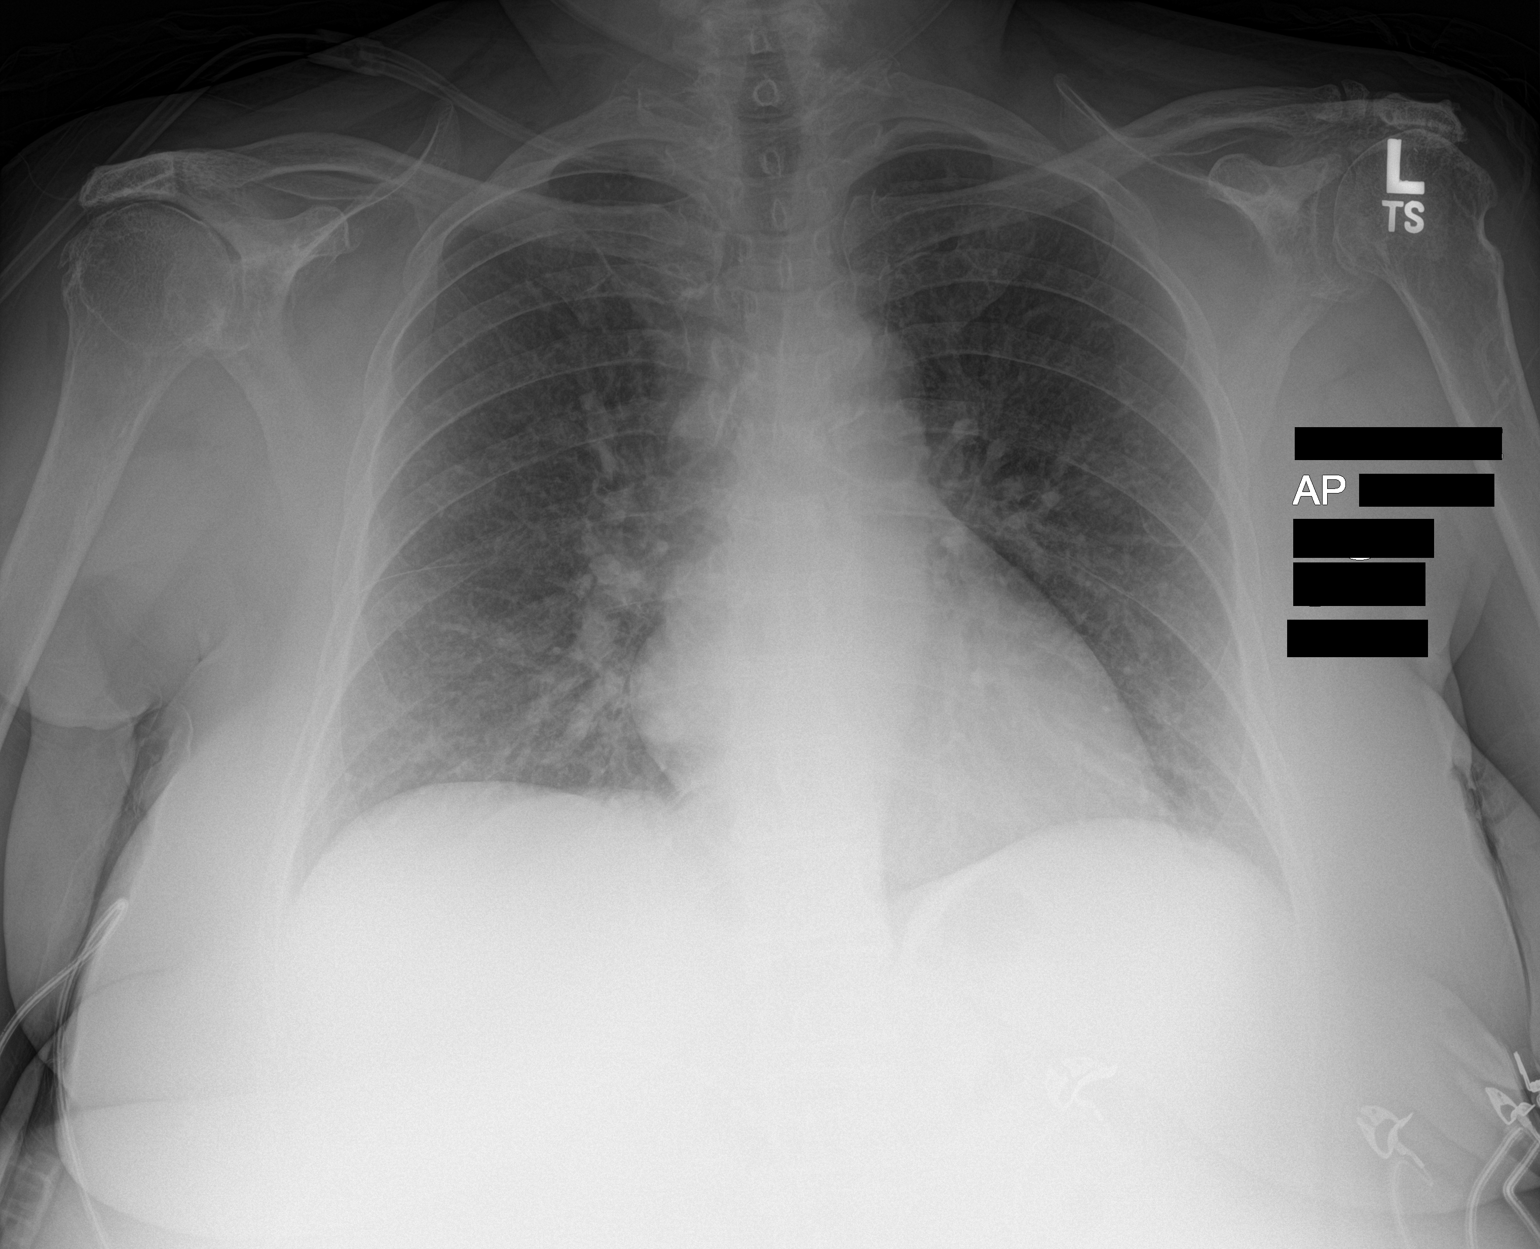

[1 of 1 positions shown; findings below may reference images not displayed]

FINDINGS: The heart size and mediastinal contours are within normal limits.
Both lungs are clear. The visualized skeletal structures are
unremarkable.
IMPRESSION: No active disease.

## 2018-01-21 IMAGING — CR DG CHEST 1V PORT
1 series · 1 of 1 positions shown · non-contrast
Comparison: 09/30/2015.

CLINICAL DATA: Chest tube.

EXAM:
PORTABLE CHEST 1 VIEW

[AP]
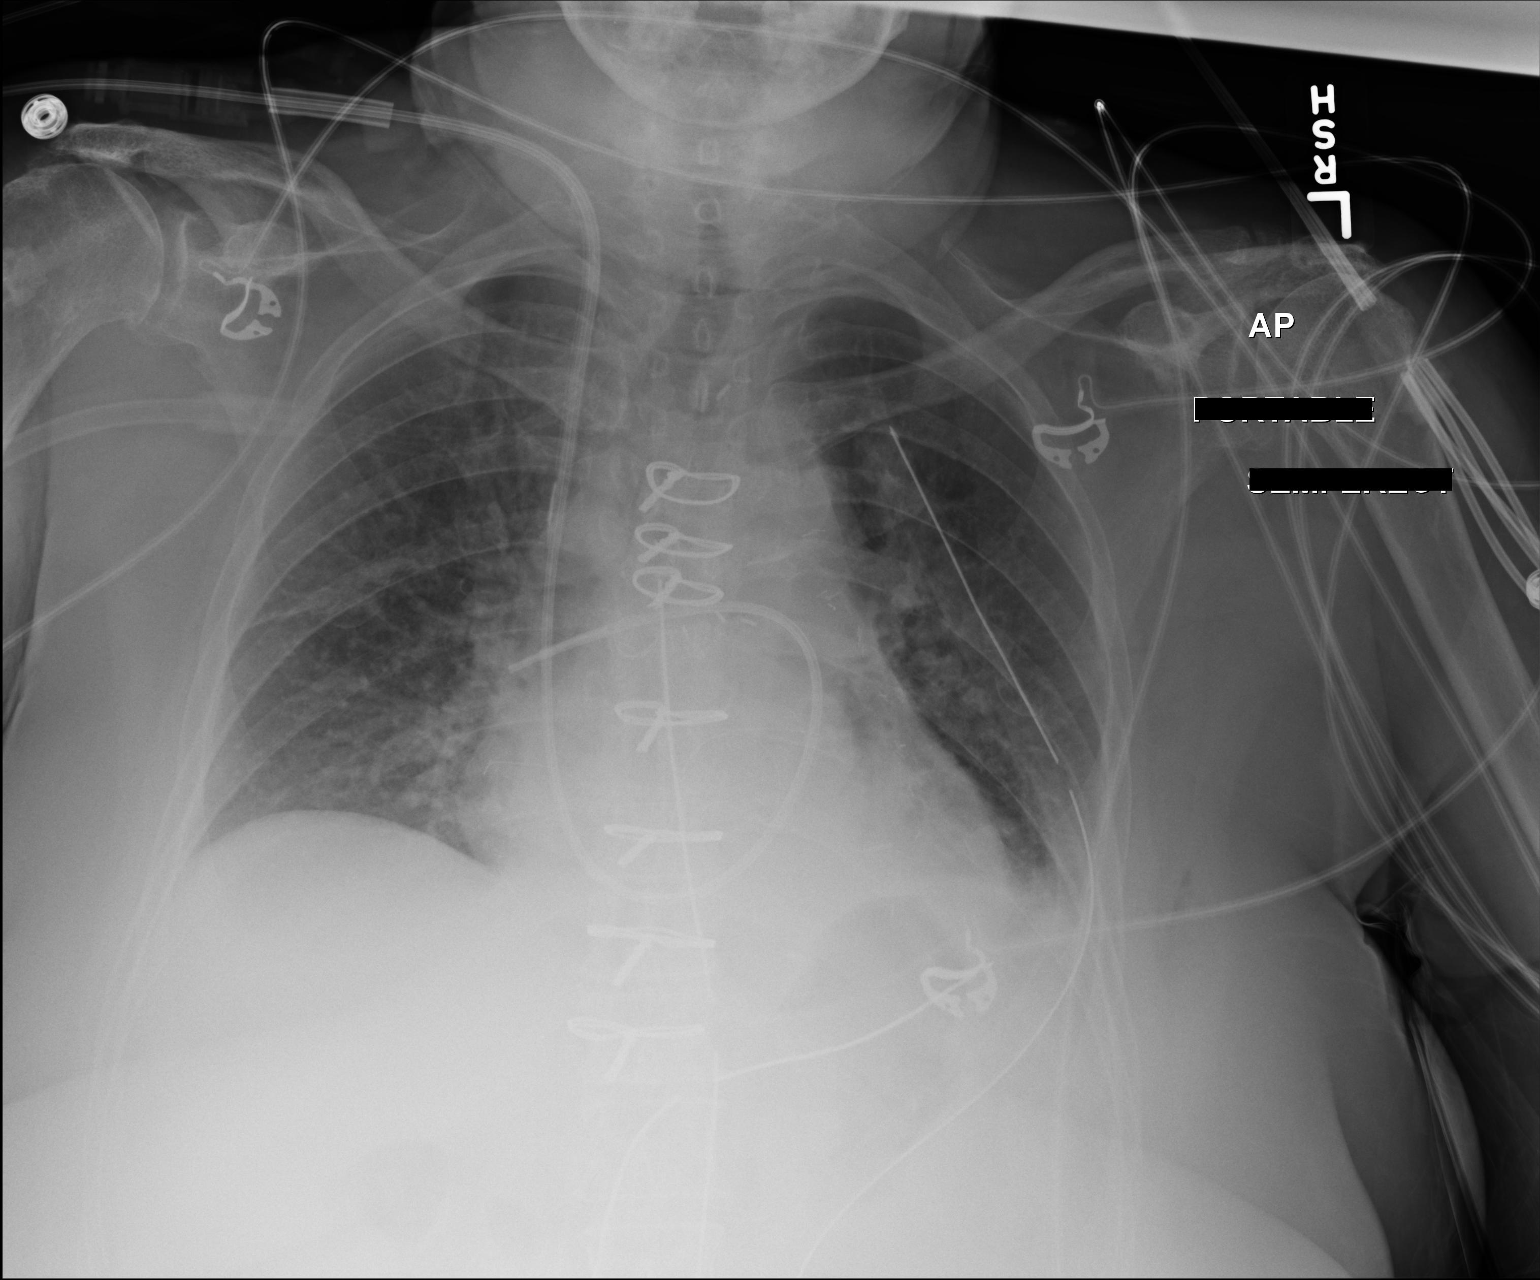

[1 of 1 positions shown; findings below may reference images not displayed]

FINDINGS: Interim extubation. Interim removal of NG tube. Swan-Ganz catheter,
mediastinum drainage catheters, left chest tube in stable position.
No pneumothorax. Prior CABG. Stable cardiomegaly with mild pulmonary
vascular prominence. Mild interstitial prominence small left pleural
effusion. Mild congestive heart failure cannot be excluded. No
pneumothorax. Mild left chest wall subcutaneous emphysema.
IMPRESSION: 1. Interim extubation and removal of NG tube. Remaining lines and
tubes including left chest tube in stable position. Mild left chest
wall subcutaneous emphysema. No pneumothorax.

2. Prior CABG. Cardiomegaly with mild pulmonary vascular congestion,
bilateral interstitial prominence, small left pleural effusion
consistent with mild congestive heart failure noted on today's exam.

## 2018-01-22 IMAGING — CR DG CHEST 1V PORT
1 series · 1 of 1 positions shown · non-contrast
Comparison: 10/01/2015.

CLINICAL DATA: Chest pain after CABG.

EXAM:
PORTABLE CHEST 1 VIEW

[AP]
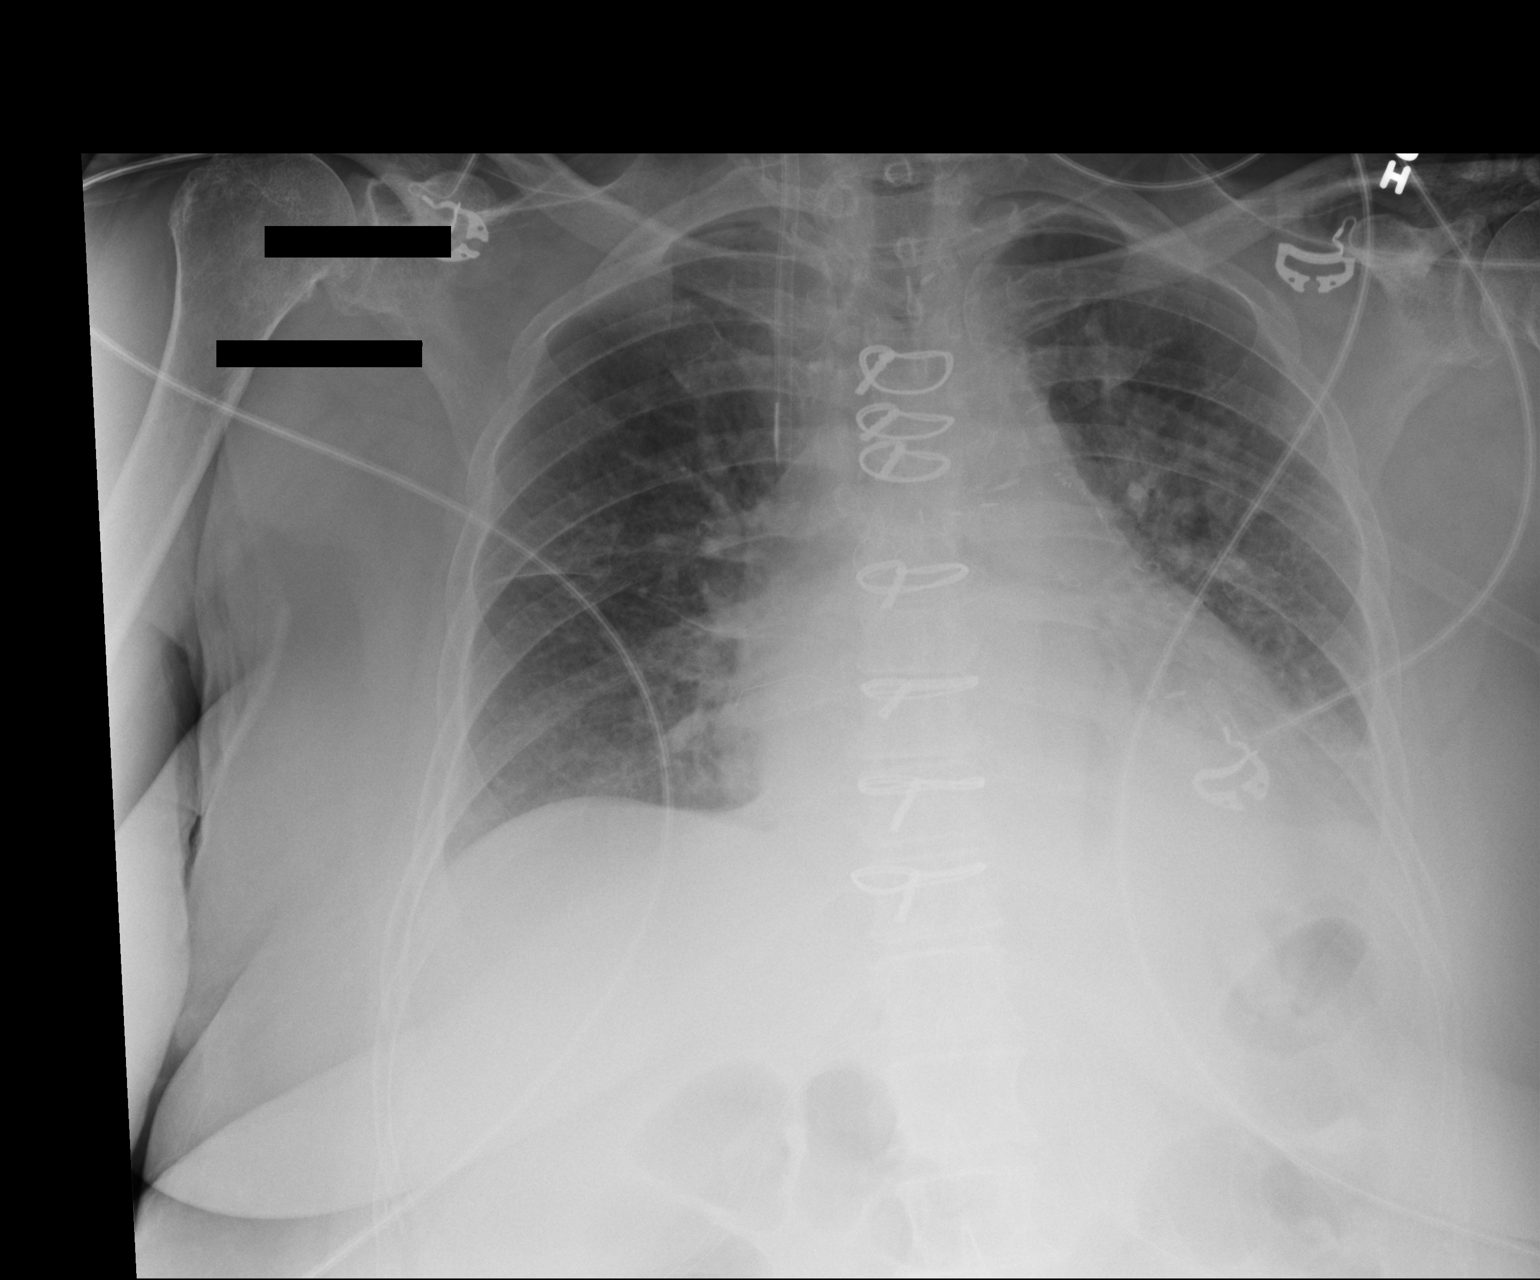

[1 of 1 positions shown; findings below may reference images not displayed]

FINDINGS: Cardiomegaly. LEFT chest tube has been removed. Sorin introducer
remains. LEFT basilar atelectasis with or without infiltrate and
small effusion. No pneumothorax. Clearing of edema.
IMPRESSION: Cardiomegaly. LEFT lower lobe process roughly stable. LEFT chest
tube removed without pneumothorax.

## 2018-02-02 ENCOUNTER — Other Ambulatory Visit: Payer: Self-pay | Admitting: Cardiovascular Disease

## 2018-04-04 DIAGNOSIS — N183 Chronic kidney disease, stage 3 (moderate): Secondary | ICD-10-CM | POA: Diagnosis not present

## 2018-04-04 DIAGNOSIS — E119 Type 2 diabetes mellitus without complications: Secondary | ICD-10-CM | POA: Diagnosis not present

## 2018-04-06 ENCOUNTER — Ambulatory Visit: Payer: Medicare HMO | Admitting: Nurse Practitioner

## 2018-04-06 ENCOUNTER — Encounter: Payer: Self-pay | Admitting: Nurse Practitioner

## 2018-04-06 VITALS — BP 140/60 | HR 63 | Ht 60.0 in | Wt 196.8 lb

## 2018-04-06 DIAGNOSIS — I2510 Atherosclerotic heart disease of native coronary artery without angina pectoris: Secondary | ICD-10-CM | POA: Diagnosis not present

## 2018-04-06 DIAGNOSIS — I10 Essential (primary) hypertension: Secondary | ICD-10-CM | POA: Diagnosis not present

## 2018-04-06 DIAGNOSIS — E785 Hyperlipidemia, unspecified: Secondary | ICD-10-CM | POA: Diagnosis not present

## 2018-04-06 DIAGNOSIS — I779 Disorder of arteries and arterioles, unspecified: Secondary | ICD-10-CM

## 2018-04-06 DIAGNOSIS — R0989 Other specified symptoms and signs involving the circulatory and respiratory systems: Secondary | ICD-10-CM

## 2018-04-06 DIAGNOSIS — E119 Type 2 diabetes mellitus without complications: Secondary | ICD-10-CM | POA: Diagnosis not present

## 2018-04-06 DIAGNOSIS — I739 Peripheral vascular disease, unspecified: Secondary | ICD-10-CM

## 2018-04-06 DIAGNOSIS — N183 Chronic kidney disease, stage 3 (moderate): Secondary | ICD-10-CM | POA: Diagnosis not present

## 2018-04-06 MED ORDER — AMLODIPINE BESYLATE 10 MG PO TABS: 10.0000 mg | ORAL_TABLET | Freq: Every day | ORAL | 3 refills | Status: DC

## 2018-04-06 NOTE — Patient Instructions (Signed)
Medication Instructions:  Your physician has recommended you make the following change in your medication:  1- INCREASE Amlodipine to 10 mg by mouth once a day.  If you need a refill on your cardiac medications before your next appointment, please call your pharmacy.   Lab work: none If you have labs (blood work) drawn today and your tests are completely normal, you will receive your results only by: Marland Kitchen MyChart Message (if you have MyChart) OR . A paper copy in the mail If you have any lab test that is abnormal or we need to change your treatment, we will call you to review the results.  Testing/Procedures: Your physician has requested that you have a carotid duplex. This test is an ultrasound of the carotid arteries in your neck. It looks at blood flow through these arteries that supply the brain with blood. Allow one hour for this exam. There are no restrictions or special instructions.    Follow-Up: At [facility], you and your health needs are our priority.  As part of our continuing mission to provide you with exceptional heart care, we have created designated Provider Care Teams.  These Care Teams include your primary Cardiologist (physician) and Advanced Practice Providers (APPs -  Physician Assistants and Nurse Practitioners) who all work together to provide you with the care you need, when you need it. You will need a follow up appointment in 6 months.  Please call our office 2 months in advance to schedule this appointment.  You may see Kathlyn Sacramento, MD or one of the following Advanced Practice Providers on your designated Care Team:   Murray Hodgkins, NP Christell Faith, PA-C . Marrianne Mood, PA-C

## 2018-04-06 NOTE — Progress Notes (Signed)
Office Visit    Patient Name: Kerri Hicks Date of Encounter: 04/06/2018  Primary Care Provider:  Glendon Axe, MD Primary Cardiologist:  Kathlyn Sacramento, MD  Chief Complaint    72 year old female with a history of CAD status post CABG x2, hypertension, hyperlipidemia, diabetes, and carotid arterial disease, who presents for follow-up related to dyspnea on exertion.  Past Medical History    Past Medical History:  Diagnosis Date  . Carotid arterial disease ([place])    a. 10/2016 Carotid U/S: RICA 0-93, LICA 26-71, bilat ECAs >50 - f/u 1 yr.  . Coronary artery disease    a. 09/2015 Anterior STEMI w/ ostial LAD disease-->CABG x 2 (LIMA to LAD and SVG to RPDA).  . Diabetes mellitus without complication ([place])   . Hyperlipidemia   . Hypertension    Past Surgical History:  Procedure Laterality Date  . CARDIAC CATHETERIZATION N/A 09/30/2015   Procedure: Left Heart Cath and Coronary Angiography;  Surgeon: Leonie Man, MD;  Location: [facility];  Service: Cardiovascular;  Laterality: N/A;  . CORONARY ARTERY BYPASS GRAFT N/A 09/30/2015   Procedure: CORONARY ARTERY BYPASS GRAFTING (CABG) TIMES TWO USING LEFT INTERNAL MAMMARY ARTERY AND ENDOSCOPICALLY HARVESTED RIGHT SAPHENOUS VEIN GRAFT;  Surgeon: Gaye Pollack, MD;  Location: [facility];  Service: Open Heart Surgery;  Laterality: N/A;    Allergies  Allergies  Allergen Reactions  . Penicillins Rash  . Penicillin G Rash    History of Present Illness    72 year old female with the above past medical history including coronary artery disease status post anterior STEMI in June 2017 with finding of severe ostial LAD disease.  She subsequently underwent CABG x2 with a LIMA to the LAD and vein graft to the right PDA.  She had postoperative atrial fibrillation but none since.  Other history includes hypertension, hyperlipidemia, type 2 diabetes mellitus and carotid arterial disease with bilateral bruits.  She was last seen in  clinic in April 2019, at which time she reported stable, mild exertional chest discomfort that was not responding to Imdur.  She was hypertensive at that time and amlodipine therapy was added.  Since her last visit, she says she has not been experiencing any chest discomfort but does have stable, chronic dyspnea on exertion that occurs when walking long distances such as hospital hallways.  She has been doing this a fair amount recently as her husband has colon cancer and she has been showing him to and fro appointments at the [facility].  She is not otherwise active.  She denies PND, orthopnea, dizziness, syncope, edema, palpitations, or early satiety.  Home Medications    Prior to Admission medications   Medication Sig Start Date End Date Taking? Authorizing Provider  acetaminophen (TYLENOL) 325 MG tablet Take 2 tablets (650 mg total) by mouth every 6 (six) hours as needed for mild pain. 10/05/15  Yes Barrett, Erin R, PA-C  amLODipine (NORVASC) 5 MG tablet Take 1 tablet (5 mg total) by mouth daily. 07/28/17 04/06/18 Yes Wellington Hampshire, MD  aspirin EC 81 MG tablet Take 81 mg by mouth daily.   Yes [provider]  Calcium 600-200 MG-UNIT tablet Take 1 tablet by mouth 2 (two) times daily.   Yes [provider]  carvedilol (COREG) 6.25 MG tablet TAKE 1 TABLET TWICE DAILY  (DISCONTINUE METOPROLOL) 02/18/17  Yes Wellington Hampshire, MD  lisinopril (PRINIVIL,ZESTRIL) 40 MG tablet TAKE 1 TABLET EVERY DAY 02/02/18  Yes Wellington Hampshire, MD  pantoprazole (PROTONIX) 40 MG tablet Take 1 tablet (40 mg total) by mouth daily. 06/18/16  Yes Wellington Hampshire, MD  simvastatin (ZOCOR) 20 MG tablet TAKE 1 TABLET EVERY DAY AT  6PM 08/18/17  Yes Wellington Hampshire, MD    Review of Systems    Chronic, stable dyspnea on exertion.  She denies chest pain, palpitations, PND, orthopnea, dizziness, syncope, edema, or early satiety.  All other systems reviewed and are otherwise negative except as noted  above.  Physical Exam    VS:  BP 140/60 (BP Location: Left Arm, Patient Position: Sitting, Cuff Size: Normal)   Pulse 63   Ht 5' (1.524 m)   Wt 196 lb 12 oz (89.2 kg)   BMI 38.43 kg/m  , BMI Body mass index is 38.43 kg/m. GEN: Well nourished, well developed, in no acute distress. HEENT: normal. Neck: Supple, no JVD, bilateral carotid bruits, no masses. Cardiac: RRR, 2/6 systolic murmur at the upper sternal borders, no rubs, or gallops. No clubbing, cyanosis, edema.  Radials/PT 2+ and equal bilaterally.  Respiratory:  Respirations regular and unlabored, clear to auscultation bilaterally. GI: Soft, nontender, nondistended, BS + x 4. MS: no deformity or atrophy. Skin: warm and dry, no rash. Neuro:  Strength and sensation are intact. Psych: Normal affect.  Accessory Clinical Findings    ECG personally reviewed by me today -regular sinus rhythm, 63- no acute changes.  Assessment & Plan    1.  Coronary artery disease: Status post CABG x2 in 2017 following anterior STEMI.  She has done relatively well since her last visit here and has not been experiencing any chest pain.  She does have chronic, stable dyspnea on exertion and she does not feel that this is changed recently.  We did discuss possibly pursuing stress testing however, given stable symptoms, she is not interested at this time.  She remains on aspirin, beta-blocker, ACE inhibitor, and statin therapy.  2.  Essential hypertension: Blood pressure elevated today at 140/60.  She does not track this at home.  I will increase her amlodipine to 10 mg daily.  She otherwise remains on beta-blocker and ACE inhibitor.  3.  Hyperlipidemia: Recent LDL of 55 in June of this year.  She remains on statin therapy.  4.  Type 2 diabetes mellitus: This is diet controlled.  A1c was 6.7 earlier this week at primary care.  5.  Carotid arterial disease: Bilateral bruits.  Stable disease by ultrasound in July 2018.  She says she was never notified that  she needs another one this summer.  I will arrange now.  6.  Disposition: Follow-up in 6 months or sooner if necessary.  She will contact us in the interim if she has change in exercise tolerance.  Murray Hodgkins, NP 04/06/2018, 10:54 AM

## 2018-05-04 ENCOUNTER — Ambulatory Visit (INDEPENDENT_AMBULATORY_CARE_PROVIDER_SITE_OTHER): Payer: Medicare HMO

## 2018-05-04 DIAGNOSIS — I739 Peripheral vascular disease, unspecified: Secondary | ICD-10-CM | POA: Diagnosis not present

## 2018-05-04 DIAGNOSIS — R0989 Other specified symptoms and signs involving the circulatory and respiratory systems: Secondary | ICD-10-CM | POA: Diagnosis not present

## 2018-05-04 DIAGNOSIS — I779 Disorder of arteries and arterioles, unspecified: Secondary | ICD-10-CM

## 2018-05-05 ENCOUNTER — Telehealth: Payer: Self-pay | Admitting: *Deleted

## 2018-05-05 DIAGNOSIS — R0989 Other specified symptoms and signs involving the circulatory and respiratory systems: Secondary | ICD-10-CM

## 2018-05-05 NOTE — Telephone Encounter (Signed)
Patient made aware of results and verbalized understanding. Repeat orders placed.    

## 2018-05-05 NOTE — Telephone Encounter (Signed)
-----   Message from Theora Gianotti, NP sent at 05/05/2018  7:53 AM EST ----- No significant dzs on carotid u/s - 1-39% in right internal carotid, and 40-59% in left internal carotid. No significant stenoses in the common carotids or external carotids.  F/u in 1 yr.

## 2018-05-24 ENCOUNTER — Other Ambulatory Visit: Payer: Self-pay | Admitting: Cardiovascular Disease

## 2018-06-02 ENCOUNTER — Other Ambulatory Visit: Payer: Self-pay | Admitting: Cardiovascular Disease

## 2018-07-06 DIAGNOSIS — N183 Chronic kidney disease, stage 3 (moderate): Secondary | ICD-10-CM | POA: Diagnosis not present

## 2018-07-12 DIAGNOSIS — I10 Essential (primary) hypertension: Secondary | ICD-10-CM | POA: Diagnosis not present

## 2018-07-12 DIAGNOSIS — N183 Chronic kidney disease, stage 3 (moderate): Secondary | ICD-10-CM | POA: Diagnosis not present

## 2018-07-12 DIAGNOSIS — I480 Paroxysmal atrial fibrillation: Secondary | ICD-10-CM | POA: Diagnosis not present

## 2018-07-12 DIAGNOSIS — E118 Type 2 diabetes mellitus with unspecified complications: Secondary | ICD-10-CM | POA: Diagnosis not present

## 2018-07-12 DIAGNOSIS — Z951 Presence of aortocoronary bypass graft: Secondary | ICD-10-CM | POA: Diagnosis not present

## 2018-07-12 DIAGNOSIS — K219 Gastro-esophageal reflux disease without esophagitis: Secondary | ICD-10-CM | POA: Diagnosis not present

## 2018-07-12 DIAGNOSIS — E7800 Pure hypercholesterolemia, unspecified: Secondary | ICD-10-CM | POA: Diagnosis not present

## 2018-07-12 DIAGNOSIS — I6523 Occlusion and stenosis of bilateral carotid arteries: Secondary | ICD-10-CM | POA: Diagnosis not present

## 2018-07-12 DIAGNOSIS — I2510 Atherosclerotic heart disease of native coronary artery without angina pectoris: Secondary | ICD-10-CM | POA: Diagnosis not present

## 2018-10-05 ENCOUNTER — Other Ambulatory Visit: Payer: Self-pay | Admitting: Cardiovascular Disease

## 2018-10-17 ENCOUNTER — Telehealth: Payer: Self-pay

## 2018-10-17 NOTE — Telephone Encounter (Signed)
    COVID-19 Pre-Screening Questions:  . In the past 7 to 10 days have you had a cough,  shortness of breath, headache, congestion, fever (100 or greater) body aches, chills, sore throat, or sudden loss of taste or sense of smell? No  . Have you been around anyone with known Covid 19. NO . Have you been around anyone who is awaiting Covid 19 test results in the past 7 to 10 days? No  . Have you been around anyone who has been exposed to Covid 19, or has mentioned symptoms of Covid 19 within the past 7 to 10 days? No   If you have any concerns/questions about symptoms patients report during screening (either on the phone or at threshold). Contact the provider seeing the patient or DOD for further guidance.  If neither are available contact a member of the leadership team.

## 2018-10-19 ENCOUNTER — Ambulatory Visit (INDEPENDENT_AMBULATORY_CARE_PROVIDER_SITE_OTHER): Payer: Medicare HMO | Admitting: Cardiovascular Disease

## 2018-10-19 ENCOUNTER — Other Ambulatory Visit: Payer: Self-pay

## 2018-10-19 ENCOUNTER — Encounter: Payer: Self-pay | Admitting: Cardiovascular Disease

## 2018-10-19 VITALS — BP 152/58 | HR 56 | Temp 97.2°F | Ht 60.0 in | Wt 201.0 lb

## 2018-10-19 DIAGNOSIS — E118 Type 2 diabetes mellitus with unspecified complications: Secondary | ICD-10-CM | POA: Diagnosis not present

## 2018-10-19 DIAGNOSIS — E785 Hyperlipidemia, unspecified: Secondary | ICD-10-CM

## 2018-10-19 DIAGNOSIS — I2510 Atherosclerotic heart disease of native coronary artery without angina pectoris: Secondary | ICD-10-CM | POA: Diagnosis not present

## 2018-10-19 DIAGNOSIS — I779 Disorder of arteries and arterioles, unspecified: Secondary | ICD-10-CM

## 2018-10-19 DIAGNOSIS — K219 Gastro-esophageal reflux disease without esophagitis: Secondary | ICD-10-CM | POA: Diagnosis not present

## 2018-10-19 DIAGNOSIS — I739 Peripheral vascular disease, unspecified: Secondary | ICD-10-CM

## 2018-10-19 DIAGNOSIS — I10 Essential (primary) hypertension: Secondary | ICD-10-CM | POA: Diagnosis not present

## 2018-10-19 DIAGNOSIS — I480 Paroxysmal atrial fibrillation: Secondary | ICD-10-CM | POA: Diagnosis not present

## 2018-10-19 DIAGNOSIS — Z951 Presence of aortocoronary bypass graft: Secondary | ICD-10-CM | POA: Diagnosis not present

## 2018-10-19 DIAGNOSIS — E7800 Pure hypercholesterolemia, unspecified: Secondary | ICD-10-CM | POA: Diagnosis not present

## 2018-10-19 MED ORDER — HYDROCHLOROTHIAZIDE 12.5 MG PO CAPS: 12.5000 mg | ORAL_CAPSULE | Freq: Every day | ORAL | 3 refills | Status: DC

## 2018-10-19 NOTE — Progress Notes (Signed)
[id number]    Cardiology Office Note   Date:  10/19/2018   ID:  Kerri Hicks, Kerri Hicks May 19, 1945, MRN [id number]  PCP:  Adin Hector, MD  Cardiologist:   Kathlyn Sacramento, MD   Chief Complaint  Patient presents with  . other    6 month follow up. Meds reviewed by the pt. verbally. "doing well."       History of Present Illness: Kerri Hicks is a 73 y.o. female who presents for a follow-up visit regarding coronary artery disease status post CABG in June of 2017. She presented with anterior ST elevation myocardial infarction and was found to have significant two-vessel coronary artery disease including ostial LAD with normal ejection fraction. She underwent CABG 2 with LIMA to LAD and SVG to right PDA. She had postoperative atrial fibrillation. She is known to have moderate bilateral carotid disease, hyperlipidemia and previous tobacco use. She was seen last year and was hypertensive.  Amlodipine was added with subsequent improvement.  She did have some atypical chest pain that did not respond to Imdur but subsequently improved after controlling her blood pressure.  She does have chronic exertional dyspnea.  She was seen by Ignacia Bayley in December and was noted to be slightly hypertensive.  Amlodipine was increased to 10 mg daily. She has been doing well overall with no chest pain.  She reports stable exertional dyspnea.  Blood pressure continues to be elevated and she reports worsening leg edema since amlodipine was increased.  Past Medical History:  Diagnosis Date  . Carotid arterial disease ([place])    a. 10/2016 Carotid U/S: RICA 0-34, LICA 74-25, bilat ECAs >50 - f/u 1 yr.  . Coronary artery disease    a. 09/2015 Anterior STEMI w/ ostial LAD disease-->CABG x 2 (LIMA to LAD and SVG to RPDA).  . Diabetes mellitus without complication ([place])   . Hyperlipidemia   . Hypertension     Past Surgical History:  Procedure Laterality Date  . CARDIAC CATHETERIZATION N/A 09/30/2015   Procedure: Left Heart Cath and Coronary Angiography;  Surgeon: Leonie Man, MD;  Location: [facility];  Service: Cardiovascular;  Laterality: N/A;  . CORONARY ARTERY BYPASS GRAFT N/A 09/30/2015   Procedure: CORONARY ARTERY BYPASS GRAFTING (CABG) TIMES TWO USING LEFT INTERNAL MAMMARY ARTERY AND ENDOSCOPICALLY HARVESTED RIGHT SAPHENOUS VEIN GRAFT;  Surgeon: Gaye Pollack, MD;  Location: [facility];  Service: Open Heart Surgery;  Laterality: N/A;     Current Outpatient Medications  Medication Sig Dispense Refill  . acetaminophen (TYLENOL) 325 MG tablet Take 2 tablets (650 mg total) by mouth every 6 (six) hours as needed for mild pain.    Marland Kitchen amLODipine (NORVASC) 10 MG tablet Take 1 tablet (10 mg total) by mouth daily. 90 tablet 3  . aspirin EC 81 MG tablet Take 81 mg by mouth daily.    . Calcium 600-200 MG-UNIT tablet Take 1 tablet by mouth 2 (two) times daily.    . carvedilol (COREG) 6.25 MG tablet TAKE 1 TABLET TWICE DAILY  (DISCONTINUE METOPROLOL) 180 tablet 1  . lisinopril (PRINIVIL,ZESTRIL) 40 MG tablet TAKE 1 TABLET EVERY DAY 90 tablet 3  . pantoprazole (PROTONIX) 40 MG tablet Take 1 tablet (40 mg total) by mouth daily. 30 tablet 0  . simvastatin (ZOCOR) 20 MG tablet TAKE 1 TABLET EVERY DAY AT  6PM 90 tablet 3   No current facility-administered medications for this visit.     Allergies:   Penicillins and Penicillin g  Social History:  The patient  reports that she quit smoking about 3 years ago. Her smoking use included cigarettes. She has a 50.00 pack-year smoking history. She has never used smokeless tobacco.   Family History:  The patient's family history includes Heart attack in her mother; Heart disease in her brother and mother; Leukemia in her father.    ROS:  Please see the history of present illness.   Otherwise, review of systems are positive for none.   All other systems are reviewed and negative.    PHYSICAL EXAM: VS:  BP (!) 152/58 (BP Location: Left Arm,  Patient Position: Sitting, Cuff Size: Large)   Pulse (!) 56   Temp (!) 97.2 F (36.2 C)   Ht 5' (1.524 m)   Wt 201 lb (91.2 kg)   BMI 39.26 kg/m  , BMI Body mass index is 39.26 kg/m. GEN: Well nourished, well developed, in no acute distress  HEENT: normal  Neck: no JVD or masses. Bilateral carotid bruits Cardiac: RRR; no rubs, or gallops . 2/ 6 systolic ejection murmur in the aortic area.  Mild to moderate bilateral leg edema Respiratory:  clear to auscultation bilaterally, normal work of breathing GI: soft, nontender, nondistended, + BS MS: no deformity or atrophy  Skin: warm and dry, no rash Neuro:  Strength and sensation are intact Psych: euthymic mood, full affect   EKG:  EKG is ordered today. The ekg ordered today demonstrates sinus bradycardia with old septal infarct.  No significant ST or T wave changes.   Recent Labs: No results found for requested labs within last 8760 hours.    Lipid Panel    Component Value Date/Time   CHOL 147 12/02/2015 0849   TRIG 130 12/02/2015 0849   HDL 51 12/02/2015 0849   CHOLHDL 2.9 12/02/2015 0849   VLDL 26 12/02/2015 0849   LDLCALC 70 12/02/2015 0849      Wt Readings from Last 3 Encounters:  10/19/18 201 lb (91.2 kg)  04/06/18 196 lb 12 oz (89.2 kg)  07/28/17 198 lb 8 oz (90 kg)        ASSESSMENT AND PLAN:  1.  Coronary artery disease involving native coronary arteries without angina: She is doing well overall right now but we need to control her blood pressure better.  Continue medical therapy.     2. Bilateral carotid disease: Most recent carotid Doppler in January 2020 showed stable mild to moderate disease.  Repeat study in January 2021.  3. Hyperlipidemia: She is currently on simvastatin 20 mg once daily.  Most recent lipid profile in December 2019 showed an LDL of 55.  4. Essential Hypertension: Blood pressure continues to be elevated in spite of 3 antihypertensive medications.  I do think we have to rule out  secondary causes especially renal artery stenosis given her known history of diffuse atherosclerosis.  I requested renal artery duplex.  I elected to add hydrochlorothiazide 12.5 mg once daily.  Check basic metabolic profile in 1 to 2 weeks. The patient denies symptoms of sleep apnea.   Disposition:   FU with me in 4 months  Signed,  Kathlyn Sacramento, MD  10/19/2018 9:12 AM    [place]

## 2018-10-19 NOTE — Patient Instructions (Signed)
Medication Instructions:  Your physician has recommended you make the following change in your medication:   START HCTZ 12.5mg  daily. An Rx has been sent to your pharmacy  If you need a refill on your cardiac medications before your next appointment, please call your pharmacy.   Lab work: Your physician recommends that you return for lab work in: 1-2 weeks ([place]) Can be done the same day you come in for your renal artery duplex  If you have labs (blood work) drawn today and your tests are completely normal, you will receive your results only by: Marland Kitchen MyChart Message (if you have MyChart) OR . A paper copy in the mail If you have any lab test that is abnormal or we need to change your treatment, we will call you to review the results.  Testing/Procedures: Your physician has requested that you have a renal artery duplex. During this test, an ultrasound is used to evaluate blood flow to the kidneys. Allow one hour for this exam. Do not eat after midnight the day before and avoid carbonated beverages. Take your medications as you usually do.    Follow-Up: At [facility], you and your health needs are our priority.  As part of our continuing mission to provide you with exceptional heart care, we have created designated Provider Care Teams.  These Care Teams include your primary Cardiologist (physician) and Advanced Practice Providers (APPs -  Physician Assistants and Nurse Practitioners) who all work together to provide you with the care you need, when you need it. You will need a follow up appointment in 4 months.  Please call our office 2 months in advance to schedule this appointment.  You may see Kathlyn Sacramento, MD or one of the following Advanced Practice Providers on your designated Care Team:   Murray Hodgkins, NP Christell Faith, PA-C . Marrianne Mood, PA-C

## 2018-10-23 DIAGNOSIS — H524 Presbyopia: Secondary | ICD-10-CM | POA: Diagnosis not present

## 2018-10-27 DIAGNOSIS — I6523 Occlusion and stenosis of bilateral carotid arteries: Secondary | ICD-10-CM | POA: Diagnosis not present

## 2018-10-27 DIAGNOSIS — Z0000 Encounter for general adult medical examination without abnormal findings: Secondary | ICD-10-CM | POA: Diagnosis not present

## 2018-10-27 DIAGNOSIS — I2510 Atherosclerotic heart disease of native coronary artery without angina pectoris: Secondary | ICD-10-CM | POA: Diagnosis not present

## 2018-10-27 DIAGNOSIS — I10 Essential (primary) hypertension: Secondary | ICD-10-CM | POA: Diagnosis not present

## 2018-10-27 DIAGNOSIS — M810 Age-related osteoporosis without current pathological fracture: Secondary | ICD-10-CM | POA: Diagnosis not present

## 2018-10-27 DIAGNOSIS — Z23 Encounter for immunization: Secondary | ICD-10-CM | POA: Diagnosis not present

## 2018-10-27 DIAGNOSIS — I480 Paroxysmal atrial fibrillation: Secondary | ICD-10-CM | POA: Diagnosis not present

## 2018-10-27 DIAGNOSIS — K219 Gastro-esophageal reflux disease without esophagitis: Secondary | ICD-10-CM | POA: Diagnosis not present

## 2018-10-27 DIAGNOSIS — E118 Type 2 diabetes mellitus with unspecified complications: Secondary | ICD-10-CM | POA: Diagnosis not present

## 2018-11-01 ENCOUNTER — Telehealth: Payer: Self-pay

## 2018-11-01 NOTE — Telephone Encounter (Signed)

## 2018-11-02 ENCOUNTER — Other Ambulatory Visit: Payer: Self-pay

## 2018-11-02 ENCOUNTER — Other Ambulatory Visit (INDEPENDENT_AMBULATORY_CARE_PROVIDER_SITE_OTHER): Payer: Medicare HMO

## 2018-11-02 ENCOUNTER — Ambulatory Visit (INDEPENDENT_AMBULATORY_CARE_PROVIDER_SITE_OTHER): Payer: Medicare HMO

## 2018-11-02 DIAGNOSIS — E785 Hyperlipidemia, unspecified: Secondary | ICD-10-CM | POA: Diagnosis not present

## 2018-11-02 DIAGNOSIS — I10 Essential (primary) hypertension: Secondary | ICD-10-CM

## 2018-11-02 DIAGNOSIS — I480 Paroxysmal atrial fibrillation: Secondary | ICD-10-CM | POA: Diagnosis not present

## 2018-11-03 LAB — BASIC METABOLIC PANEL
BUN/Creatinine Ratio: 20 (ref 12–28)
BUN: 28 mg/dL — ABNORMAL HIGH (ref 8–27)
CO2: 22 mmol/L (ref 20–29)
Calcium: 9.2 mg/dL (ref 8.7–10.3)
Chloride: 106 mmol/L (ref 96–106)
Creatinine, Ser: 1.37 mg/dL — ABNORMAL HIGH (ref 0.57–1.00)
GFR calc Af Amer: 45 mL/min/{1.73_m2} — ABNORMAL LOW (ref >59)
GFR calc non Af Amer: 39 mL/min/{1.73_m2} — ABNORMAL LOW (ref >59)
Glucose: 144 mg/dL — ABNORMAL HIGH (ref 65–99)
Potassium: 5.1 mmol/L (ref 3.5–5.2)
Sodium: 141 mmol/L (ref 134–144)

## 2018-11-08 ENCOUNTER — Telehealth: Payer: Self-pay

## 2018-11-08 NOTE — Telephone Encounter (Signed)
-----   Message from Wellington Hampshire, MD sent at 11/03/2018 11:03 AM EDT ----- Inform patient that renal artery duplex showed no evidence of renal artery stenosis.

## 2018-11-08 NOTE — Telephone Encounter (Signed)
Pt made aware renal artery duplex results with verbalized understanding.

## 2018-11-17 ENCOUNTER — Other Ambulatory Visit: Payer: Self-pay | Admitting: Cardiovascular Disease

## 2019-01-22 DIAGNOSIS — E118 Type 2 diabetes mellitus with unspecified complications: Secondary | ICD-10-CM | POA: Diagnosis not present

## 2019-01-22 DIAGNOSIS — Z1159 Encounter for screening for other viral diseases: Secondary | ICD-10-CM | POA: Diagnosis not present

## 2019-01-22 DIAGNOSIS — N183 Chronic kidney disease, stage 3 unspecified: Secondary | ICD-10-CM | POA: Diagnosis not present

## 2019-01-22 DIAGNOSIS — I10 Essential (primary) hypertension: Secondary | ICD-10-CM | POA: Diagnosis not present

## 2019-01-22 DIAGNOSIS — D649 Anemia, unspecified: Secondary | ICD-10-CM | POA: Diagnosis not present

## 2019-01-24 ENCOUNTER — Other Ambulatory Visit: Payer: Self-pay | Admitting: Internal Medicine

## 2019-01-24 DIAGNOSIS — D649 Anemia, unspecified: Secondary | ICD-10-CM | POA: Diagnosis not present

## 2019-01-24 DIAGNOSIS — I129 Hypertensive chronic kidney disease with stage 1 through stage 4 chronic kidney disease, or unspecified chronic kidney disease: Secondary | ICD-10-CM | POA: Diagnosis not present

## 2019-01-24 DIAGNOSIS — N183 Chronic kidney disease, stage 3 unspecified: Secondary | ICD-10-CM | POA: Diagnosis not present

## 2019-01-24 DIAGNOSIS — Z87891 Personal history of nicotine dependence: Secondary | ICD-10-CM | POA: Diagnosis not present

## 2019-01-24 DIAGNOSIS — Z1231 Encounter for screening mammogram for malignant neoplasm of breast: Secondary | ICD-10-CM

## 2019-01-24 DIAGNOSIS — K648 Other hemorrhoids: Secondary | ICD-10-CM | POA: Diagnosis not present

## 2019-01-24 DIAGNOSIS — K625 Hemorrhage of anus and rectum: Secondary | ICD-10-CM | POA: Diagnosis not present

## 2019-01-24 DIAGNOSIS — K6289 Other specified diseases of anus and rectum: Secondary | ICD-10-CM | POA: Diagnosis not present

## 2019-01-29 DIAGNOSIS — N183 Chronic kidney disease, stage 3 unspecified: Secondary | ICD-10-CM | POA: Diagnosis not present

## 2019-01-29 DIAGNOSIS — I774 Celiac artery compression syndrome: Secondary | ICD-10-CM | POA: Diagnosis not present

## 2019-01-29 DIAGNOSIS — K625 Hemorrhage of anus and rectum: Secondary | ICD-10-CM | POA: Diagnosis not present

## 2019-01-29 DIAGNOSIS — Z1211 Encounter for screening for malignant neoplasm of colon: Secondary | ICD-10-CM | POA: Diagnosis not present

## 2019-01-29 DIAGNOSIS — D649 Anemia, unspecified: Secondary | ICD-10-CM | POA: Diagnosis not present

## 2019-01-29 DIAGNOSIS — I129 Hypertensive chronic kidney disease with stage 1 through stage 4 chronic kidney disease, or unspecified chronic kidney disease: Secondary | ICD-10-CM | POA: Diagnosis not present

## 2019-01-29 DIAGNOSIS — E7800 Pure hypercholesterolemia, unspecified: Secondary | ICD-10-CM | POA: Diagnosis not present

## 2019-01-29 DIAGNOSIS — I6523 Occlusion and stenosis of bilateral carotid arteries: Secondary | ICD-10-CM | POA: Diagnosis not present

## 2019-01-29 DIAGNOSIS — E1122 Type 2 diabetes mellitus with diabetic chronic kidney disease: Secondary | ICD-10-CM | POA: Diagnosis not present

## 2019-01-29 DIAGNOSIS — K219 Gastro-esophageal reflux disease without esophagitis: Secondary | ICD-10-CM | POA: Diagnosis not present

## 2019-01-29 DIAGNOSIS — I2510 Atherosclerotic heart disease of native coronary artery without angina pectoris: Secondary | ICD-10-CM | POA: Diagnosis not present

## 2019-01-29 DIAGNOSIS — R1031 Right lower quadrant pain: Secondary | ICD-10-CM | POA: Diagnosis not present

## 2019-01-29 DIAGNOSIS — R197 Diarrhea, unspecified: Secondary | ICD-10-CM | POA: Diagnosis not present

## 2019-01-29 DIAGNOSIS — I480 Paroxysmal atrial fibrillation: Secondary | ICD-10-CM | POA: Diagnosis not present

## 2019-01-29 DIAGNOSIS — R1032 Left lower quadrant pain: Secondary | ICD-10-CM | POA: Diagnosis not present

## 2019-01-30 ENCOUNTER — Telehealth: Payer: Self-pay | Admitting: Cardiovascular Disease

## 2019-01-30 DIAGNOSIS — R197 Diarrhea, unspecified: Secondary | ICD-10-CM | POA: Diagnosis not present

## 2019-01-30 NOTE — Telephone Encounter (Signed)
° °  [facility] Pre-operative Risk Assessment    Request for surgical clearance:  1. What type of surgery is being performed? Colonoscopy    2. When is this surgery scheduled? 04/11/19   3. What type of clearance is required (medical clearance vs. Pharmacy clearance to hold med vs. Both)? both  4. Are there any medications that need to be held prior to surgery and how long?please advise   5. Practice name and name of physician performing surgery? [facility] Tammi Klippel PA   6. What is your office phone number [phone]   7.   What is your office fax number  [phone]  8.   Anesthesia type (None, local, MAC, general) ? Monitored    Kerri Hicks 01/30/2019, 4:14 PM  _________________________________________________________________   (provider comments below)

## 2019-01-31 NOTE — Telephone Encounter (Signed)
   Primary Cardiologist: Kathlyn Sacramento, MD  Chart reviewed as part of pre-operative protocol coverage. Patient was contacted 01/31/2019 in reference to pre-operative risk assessment for pending surgery as outlined below.  Kerri Hicks was last seen on 10/19/2018 by Dr. Fletcher Anon.  Since that day, [name] has done well without chest pain. Her baseline dyspnea is unchanged.  Therefore, based on ACC/AHA guidelines, the patient would be at acceptable risk for the planned procedure without further cardiovascular testing.   I will route this recommendation to the requesting party via Epic fax function and remove from pre-op pool.  Please call with questions.  [place], [place] 01/31/2019, 10:29 AM

## 2019-02-01 ENCOUNTER — Other Ambulatory Visit: Payer: Self-pay | Admitting: Cardiovascular Disease

## 2019-02-06 ENCOUNTER — Other Ambulatory Visit: Payer: Self-pay | Admitting: *Deleted

## 2019-02-06 DIAGNOSIS — E876 Hypokalemia: Secondary | ICD-10-CM | POA: Diagnosis not present

## 2019-02-06 MED ORDER — CARVEDILOL 6.25 MG PO TABS: 6.2500 mg | ORAL_TABLET | Freq: Two times a day (BID) | ORAL | 0 refills | Status: DC

## 2019-02-08 ENCOUNTER — Other Ambulatory Visit: Payer: Self-pay | Admitting: *Deleted

## 2019-02-08 MED ORDER — CARVEDILOL 6.25 MG PO TABS: 6.2500 mg | ORAL_TABLET | Freq: Two times a day (BID) | ORAL | 0 refills | Status: DC

## 2019-03-23 ENCOUNTER — Ambulatory Visit: Payer: Medicare HMO | Admitting: Cardiovascular Disease

## 2019-03-29 ENCOUNTER — Other Ambulatory Visit: Payer: Self-pay | Admitting: Cardiovascular Disease

## 2019-04-09 ENCOUNTER — Other Ambulatory Visit: Payer: Self-pay | Admitting: Cardiovascular Disease

## 2019-04-16 ENCOUNTER — Other Ambulatory Visit: Payer: Medicare HMO | Attending: Internal Medicine

## 2019-04-18 ENCOUNTER — Ambulatory Visit: Admission: RE | Admit: 2019-04-18 | Payer: Medicare HMO | Source: Home / Self Care | Admitting: Internal Medicine

## 2019-04-18 ENCOUNTER — Encounter: Admission: RE | Payer: Self-pay | Source: Home / Self Care

## 2019-04-18 SURGERY — COLONOSCOPY WITH PROPOFOL
Anesthesia: General

## 2019-04-26 ENCOUNTER — Other Ambulatory Visit: Payer: Self-pay

## 2019-04-26 MED ORDER — AMLODIPINE BESYLATE 10 MG PO TABS: 10.0000 mg | ORAL_TABLET | Freq: Every day | ORAL | 0 refills | Status: DC

## 2019-04-30 DIAGNOSIS — D649 Anemia, unspecified: Secondary | ICD-10-CM | POA: Diagnosis not present

## 2019-04-30 DIAGNOSIS — E118 Type 2 diabetes mellitus with unspecified complications: Secondary | ICD-10-CM | POA: Diagnosis not present

## 2019-04-30 DIAGNOSIS — I10 Essential (primary) hypertension: Secondary | ICD-10-CM | POA: Diagnosis not present

## 2019-04-30 DIAGNOSIS — N183 Chronic kidney disease, stage 3 unspecified: Secondary | ICD-10-CM | POA: Diagnosis not present

## 2019-04-30 DIAGNOSIS — E7800 Pure hypercholesterolemia, unspecified: Secondary | ICD-10-CM | POA: Diagnosis not present

## 2019-05-04 ENCOUNTER — Encounter: Payer: Self-pay | Admitting: Cardiovascular Disease

## 2019-05-04 ENCOUNTER — Ambulatory Visit (INDEPENDENT_AMBULATORY_CARE_PROVIDER_SITE_OTHER): Payer: Medicare HMO | Admitting: Cardiovascular Disease

## 2019-05-04 ENCOUNTER — Other Ambulatory Visit: Payer: Self-pay

## 2019-05-04 VITALS — BP 120/58 | HR 63 | Ht 60.0 in | Wt 188.2 lb

## 2019-05-04 DIAGNOSIS — I779 Disorder of arteries and arterioles, unspecified: Secondary | ICD-10-CM

## 2019-05-04 DIAGNOSIS — I10 Essential (primary) hypertension: Secondary | ICD-10-CM | POA: Diagnosis not present

## 2019-05-04 DIAGNOSIS — E7800 Pure hypercholesterolemia, unspecified: Secondary | ICD-10-CM | POA: Diagnosis not present

## 2019-05-04 DIAGNOSIS — I2510 Atherosclerotic heart disease of native coronary artery without angina pectoris: Secondary | ICD-10-CM | POA: Diagnosis not present

## 2019-05-04 NOTE — Progress Notes (Signed)
[id number]    Cardiology Office Note   Date:  05/04/2019   ID:  Kerri Hicks, [name] November 18, 1945, MRN [id number]  PCP:  Kerri Hector, MD  Cardiologist:   Kerri Sacramento, MD   Chief Complaint  Patient presents with  . office visit    4 month F/U; Meds verbally reviewed with patient.      History of Present Illness: Kerri Hicks is a 74 y.o. female who presents for a follow-up visit regarding coronary artery disease status post CABG in June of 2017. She presented with anterior ST elevation myocardial infarction and was found to have significant two-vessel coronary artery disease including ostial LAD with normal ejection fraction. She underwent CABG 2 with LIMA to LAD and SVG to right PDA. She had postoperative atrial fibrillation. She is known to have moderate bilateral carotid disease, hyperlipidemia and previous tobacco use. She was seen last year and was hypertensive.  Amlodipine was added with subsequent improvement.  She did have some atypical chest pain that did not respond to Imdur but subsequently improved after controlling her blood pressure.    During last visit, she was noted to be still hypertensive with increased leg edema thought to be due to amlodipine.  I added small dose hydrochlorothiazide and schedule renal artery duplex in July which showed no evidence of renal artery stenosis.  She does have underlying chronic kidney disease with most recent creatinine of 1.5 last week.  Her creatinine did peak at 1.9 and she reports that lisinopril was decreased to half by Dr. Caryl Hicks.  Her blood pressure has been controlled.  She has been feeling well overall.  No chest pain or shortness of breath.  No significant leg edema.  Past Medical History:  Diagnosis Date  . Carotid arterial disease ([place])    a. 10/2016 Carotid U/S: RICA [id number], LICA [id number], bilat ECAs >50 - f/u 1 yr.  . Coronary artery disease    a. 09/2015 Anterior STEMI w/ ostial LAD disease-->CABG x 2 (LIMA to LAD and SVG  to RPDA).  . Diabetes mellitus without complication ([place])   . Hyperlipidemia   . Hypertension     Past Surgical History:  Procedure Laterality Date  . ANKLE SURGERY Left   . CARDIAC CATHETERIZATION N/A 09/30/2015   Procedure: Left Heart Cath and Coronary Angiography;  Surgeon: Kerri Man, MD;  Location: [facility];  Service: Cardiovascular;  Laterality: N/A;  . CORONARY ARTERY BYPASS GRAFT N/A 09/30/2015   Procedure: CORONARY ARTERY BYPASS GRAFTING (CABG) TIMES TWO USING LEFT INTERNAL MAMMARY ARTERY AND ENDOSCOPICALLY HARVESTED RIGHT SAPHENOUS VEIN GRAFT;  Surgeon: Kerri Pollack, MD;  Location: [facility];  Service: Open Heart Surgery;  Laterality: N/A;     Current Outpatient Medications  Medication Sig Dispense Refill  . acetaminophen (TYLENOL) 325 MG tablet Take 2 tablets (650 mg total) by mouth every 6 (six) hours as needed for mild pain.    Marland Kitchen amLODipine (NORVASC) 10 MG tablet Take 1 tablet (10 mg total) by mouth daily. 90 tablet 0  . aspirin EC 81 MG tablet Take 81 mg by mouth daily.    . Calcium 600-200 MG-UNIT tablet Take 1 tablet by mouth 2 (two) times daily.    . carvedilol (COREG) 6.25 MG tablet Take 1 tablet (6.25 mg total) by mouth 2 (two) times daily with a meal. 180 tablet 0  . docusate sodium (COLACE) 100 MG capsule Take 100 mg by mouth daily.    . hydrochlorothiazide (MICROZIDE) 12.5  MG capsule Take 1 capsule (12.5 mg total) by mouth daily. 90 capsule 3  . lisinopril (ZESTRIL) 40 MG tablet TAKE 1 TABLET EVERY DAY (Patient taking differently: Take 20 mg by mouth daily. ) 90 tablet 0  . pantoprazole (PROTONIX) 40 MG tablet Take 1 tablet (40 mg total) by mouth daily. 30 tablet 0  . simvastatin (ZOCOR) 20 MG tablet TAKE 1 TABLET EVERY DAY AT 6PM 90 tablet 0   No current facility-administered medications for this visit.    Allergies:   Penicillins and Penicillin g    Social History:  The patient  reports that she quit smoking about 3 years ago. Her smoking use  included cigarettes. She has a 50.00 pack-year smoking history. She has never used smokeless tobacco. She reports current alcohol use. She reports that she does not use drugs.   Family History:  The patient's family history includes Heart attack in her mother; Heart disease in her brother and mother; Leukemia in her father.    ROS:  Please see the history of present illness.   Otherwise, review of systems are positive for none.   All other systems are reviewed and negative.    PHYSICAL EXAM: VS:  BP (!) 120/58 (BP Location: Left Arm, Patient Position: Sitting, Cuff Size: Normal)   Pulse 63   Ht 5' (1.524 m)   Wt 188 lb 4 oz (85.4 kg)   SpO2 98%   BMI 36.77 kg/m  , BMI Body mass index is 36.77 kg/m. GEN: Well nourished, well developed, in no acute distress  HEENT: normal  Neck: no JVD or masses. Bilateral carotid bruits louder on the right side Cardiac: RRR; no rubs, or gallops . 2/ 6 systolic ejection murmur in the aortic area.  No significant edema.   Respiratory:  clear to auscultation bilaterally, normal work of breathing GI: soft, nontender, nondistended, + BS MS: no deformity or atrophy  Skin: warm and dry, no rash Neuro:  Strength and sensation are intact Psych: euthymic mood, full affect   EKG:  EKG is ordered today. The ekg ordered today demonstrates normal sinus rhythm with nonspecific T wave changes.    Recent Labs: 11/02/2018: BUN 28; Creatinine, Ser 1.37; Potassium 5.1; Sodium 141    Lipid Panel    Component Value Date/Time   CHOL 147 12/02/2015 0849   TRIG 130 12/02/2015 0849   HDL 51 12/02/2015 0849   CHOLHDL 2.9 12/02/2015 0849   VLDL 26 12/02/2015 0849   LDLCALC 70 12/02/2015 0849      Wt Readings from Last 3 Encounters:  05/04/19 188 lb 4 oz (85.4 kg)  10/19/18 201 lb (91.2 kg)  04/06/18 196 lb 12 oz (89.2 kg)        ASSESSMENT AND PLAN:  1.  Coronary artery disease involving native coronary arteries without angina: He is doing very well  with no anginal symptoms.  Continue medical therapy.     2. Bilateral carotid disease: Most recent carotid Doppler in January 2020 showed stable mild to moderate disease.  I requested a follow-up carotid Doppler.  3. Hyperlipidemia: She is currently on simvastatin 20 mg once daily.  Recent lipid profile showed an LDL of 51 and triglyceride of 116.    4. Essential Hypertension: Blood pressure is now well controlled on current medications.    Disposition:   FU with me in 6 months  Signed,  Kerri Sacramento, MD  05/04/2019 10:55 AM    [place]

## 2019-05-04 NOTE — Patient Instructions (Signed)
Medication Instructions:  Your physician recommends that you continue on your current medications as directed. Please refer to the Current Medication list given to you today.  *If you need a refill on your cardiac medications before your next appointment, please call your pharmacy*  Lab Work: None ordered If you have labs (blood work) drawn today and your tests are completely normal, you will receive your results only by: Marland Kitchen MyChart Message (if you have MyChart) OR . A paper copy in the mail If you have any lab test that is abnormal or we need to change your treatment, we will call you to review the results.  Testing/Procedures: Your physician has requested that you have a carotid duplex. This test is an ultrasound of the carotid arteries in your neck. It looks at blood flow through these arteries that supply the brain with blood. Allow one hour for this exam. There are no restrictions or special instructions.    Follow-Up: At [facility], you and your health needs are our priority.  As part of our continuing mission to provide you with exceptional heart care, we have created designated Provider Care Teams.  These Care Teams include your primary Cardiologist (physician) and Advanced Practice Providers (APPs -  Physician Assistants and Nurse Practitioners) who all work together to provide you with the care you need, when you need it.  Your next appointment:   6 month(s)  The format for your next appointment:   In Person  Provider:    You may see Kathlyn Sacramento, MD or one of the following Advanced Practice Providers on your designated Care Team:    Murray Hodgkins, NP  Christell Faith, PA-C  Marrianne Mood, PA-C   Other Instructions N/A

## 2019-05-07 DIAGNOSIS — K219 Gastro-esophageal reflux disease without esophagitis: Secondary | ICD-10-CM | POA: Diagnosis not present

## 2019-05-07 DIAGNOSIS — I6523 Occlusion and stenosis of bilateral carotid arteries: Secondary | ICD-10-CM | POA: Diagnosis not present

## 2019-05-07 DIAGNOSIS — I480 Paroxysmal atrial fibrillation: Secondary | ICD-10-CM | POA: Diagnosis not present

## 2019-05-07 DIAGNOSIS — I10 Essential (primary) hypertension: Secondary | ICD-10-CM | POA: Diagnosis not present

## 2019-05-07 DIAGNOSIS — I774 Celiac artery compression syndrome: Secondary | ICD-10-CM | POA: Diagnosis not present

## 2019-05-07 DIAGNOSIS — E1122 Type 2 diabetes mellitus with diabetic chronic kidney disease: Secondary | ICD-10-CM | POA: Diagnosis not present

## 2019-05-07 DIAGNOSIS — I2510 Atherosclerotic heart disease of native coronary artery without angina pectoris: Secondary | ICD-10-CM | POA: Diagnosis not present

## 2019-05-07 DIAGNOSIS — N183 Chronic kidney disease, stage 3 unspecified: Secondary | ICD-10-CM | POA: Diagnosis not present

## 2019-05-07 DIAGNOSIS — E7800 Pure hypercholesterolemia, unspecified: Secondary | ICD-10-CM | POA: Diagnosis not present

## 2019-05-21 ENCOUNTER — Other Ambulatory Visit: Payer: Self-pay | Admitting: Cardiovascular Disease

## 2019-05-23 ENCOUNTER — Ambulatory Visit
Admission: RE | Admit: 2019-05-23 | Discharge: 2019-05-23 | Disposition: A | Discharge status: Home / Self Care | Payer: Medicare HMO | Source: Ambulatory Visit | Attending: Internal Medicine | Admitting: Internal Medicine

## 2019-05-23 DIAGNOSIS — Z1231 Encounter for screening mammogram for malignant neoplasm of breast: Secondary | ICD-10-CM | POA: Insufficient documentation

## 2019-06-04 ENCOUNTER — Other Ambulatory Visit: Payer: Self-pay | Admitting: Cardiovascular Disease

## 2019-06-11 ENCOUNTER — Other Ambulatory Visit: Payer: Self-pay | Admitting: Cardiovascular Disease

## 2019-06-21 ENCOUNTER — Other Ambulatory Visit: Payer: Self-pay | Admitting: Cardiovascular Disease

## 2019-06-21 ENCOUNTER — Telehealth: Payer: Self-pay

## 2019-06-21 ENCOUNTER — Ambulatory Visit (INDEPENDENT_AMBULATORY_CARE_PROVIDER_SITE_OTHER): Payer: Medicare HMO

## 2019-06-21 ENCOUNTER — Other Ambulatory Visit: Payer: Self-pay

## 2019-06-21 DIAGNOSIS — I6523 Occlusion and stenosis of bilateral carotid arteries: Secondary | ICD-10-CM

## 2019-06-21 DIAGNOSIS — I779 Disorder of arteries and arterioles, unspecified: Secondary | ICD-10-CM | POA: Diagnosis not present

## 2019-06-21 NOTE — Telephone Encounter (Signed)
Call to patient to discuss ultrasound results.   Pt verbalized understanding.   No further questions or orders at this time.   Advised pt to call for any further questions or concerns.

## 2019-06-21 NOTE — Telephone Encounter (Signed)
-----   Message from Theora Gianotti, NP sent at 06/21/2019 12:31 PM EST ----- Bilateral 40-59% carotid stenoses.  This represents a slight progression on the right.  F/u in 1 yr.

## 2019-06-29 ENCOUNTER — Other Ambulatory Visit: Payer: Self-pay

## 2019-06-29 DIAGNOSIS — R0989 Other specified symptoms and signs involving the circulatory and respiratory systems: Secondary | ICD-10-CM

## 2019-06-29 NOTE — Progress Notes (Signed)
Carotid

## 2019-07-02 ENCOUNTER — Other Ambulatory Visit: Payer: Self-pay | Admitting: Cardiovascular Disease

## 2019-07-23 ENCOUNTER — Other Ambulatory Visit: Payer: Self-pay | Admitting: Cardiovascular Disease

## 2019-08-17 ENCOUNTER — Other Ambulatory Visit: Payer: Self-pay | Admitting: Cardiovascular Disease

## 2019-09-07 ENCOUNTER — Other Ambulatory Visit: Payer: Self-pay | Admitting: Cardiovascular Disease

## 2019-10-01 ENCOUNTER — Other Ambulatory Visit: Payer: Self-pay | Admitting: Cardiovascular Disease

## 2019-10-24 ENCOUNTER — Other Ambulatory Visit: Payer: Self-pay | Admitting: Cardiovascular Disease

## 2019-10-29 DIAGNOSIS — N183 Chronic kidney disease, stage 3 unspecified: Secondary | ICD-10-CM | POA: Diagnosis not present

## 2019-10-29 DIAGNOSIS — H353132 Nonexudative age-related macular degeneration, bilateral, intermediate dry stage: Secondary | ICD-10-CM | POA: Diagnosis not present

## 2019-10-29 DIAGNOSIS — Z1159 Encounter for screening for other viral diseases: Secondary | ICD-10-CM | POA: Diagnosis not present

## 2019-10-29 DIAGNOSIS — E118 Type 2 diabetes mellitus with unspecified complications: Secondary | ICD-10-CM | POA: Diagnosis not present

## 2019-10-29 DIAGNOSIS — E7800 Pure hypercholesterolemia, unspecified: Secondary | ICD-10-CM | POA: Diagnosis not present

## 2019-11-01 ENCOUNTER — Ambulatory Visit: Payer: Medicare HMO | Admitting: Cardiovascular Disease

## 2019-11-02 ENCOUNTER — Encounter: Payer: Self-pay | Admitting: Nurse Practitioner

## 2019-11-02 ENCOUNTER — Other Ambulatory Visit: Payer: Self-pay

## 2019-11-02 ENCOUNTER — Ambulatory Visit (INDEPENDENT_AMBULATORY_CARE_PROVIDER_SITE_OTHER): Payer: Medicare HMO | Admitting: Nurse Practitioner

## 2019-11-02 VITALS — BP 120/58 | HR 61 | Ht 60.0 in | Wt 195.0 lb

## 2019-11-02 DIAGNOSIS — I25110 Atherosclerotic heart disease of native coronary artery with unstable angina pectoris: Secondary | ICD-10-CM

## 2019-11-02 DIAGNOSIS — I739 Peripheral vascular disease, unspecified: Secondary | ICD-10-CM

## 2019-11-02 DIAGNOSIS — E785 Hyperlipidemia, unspecified: Secondary | ICD-10-CM

## 2019-11-02 DIAGNOSIS — I200 Unstable angina: Secondary | ICD-10-CM

## 2019-11-02 DIAGNOSIS — I10 Essential (primary) hypertension: Secondary | ICD-10-CM | POA: Diagnosis not present

## 2019-11-02 MED ORDER — NITROGLYCERIN 0.4 MG SL SUBL: 0.4000 mg | SUBLINGUAL_TABLET | SUBLINGUAL | 3 refills | Status: AC | PRN

## 2019-11-02 NOTE — Progress Notes (Signed)
Office Visit    Patient Name: Kerri Hicks Date of Encounter: 11/02/2019  Primary Care Provider:  Adin Hector, MD Primary Cardiologist:  Kathlyn Sacramento, MD  Chief Complaint    74 year old female with a history of CAD status post two-vessel bypass in June 2017, hypertension, hyperlipidemia, diabetes, carotid arterial disease, and postoperative atrial fibrillation, who presents for follow-up of angina and claudication.  Past Medical History    Past Medical History:  Diagnosis Date  . Carotid arterial disease ([place])    a. 10/2016 Carotid U/S: RICA 2-11, LICA 94-17, bilat ECAs >50; b. 06/2019 U/S: bilat ICA 40-59%, bilat ECA >50%-->f/u 1 yr.  . Celiac artery stenosis ([place])    a. 10/2018 Abd duplex: 70-99% celiac artery stenosis.  . Coronary artery disease    a. 09/2015 Anterior STEMI w/ ostial LAD disease-->CABG x 2 (LIMA to LAD and SVG to RPDA).  . Diabetes mellitus without complication ([place])   . History of echocardiogram    a. 09/2015 TEE (intraop): EF 50-55%.  Marland Kitchen Hyperlipidemia   . Hypertension    a. 10/2018 Renal artery duplex: 1-59% bilat RA stenosis.   Past Surgical History:  Procedure Laterality Date  . ANKLE SURGERY Left   . CARDIAC CATHETERIZATION N/A 09/30/2015   Procedure: Left Heart Cath and Coronary Angiography;  Surgeon: Leonie Man, MD;  Location: [facility];  Service: Cardiovascular;  Laterality: N/A;  . CORONARY ARTERY BYPASS GRAFT N/A 09/30/2015   Procedure: CORONARY ARTERY BYPASS GRAFTING (CABG) TIMES TWO USING LEFT INTERNAL MAMMARY ARTERY AND ENDOSCOPICALLY HARVESTED RIGHT SAPHENOUS VEIN GRAFT;  Surgeon: Gaye Pollack, MD;  Location: [facility];  Service: Open Heart Surgery;  Laterality: N/A;    Allergies  Allergies  Allergen Reactions  . Penicillins Rash  . Penicillin G Rash    History of Present Illness    74 year old female with above complex past medical history including coronary artery disease, hypertension, hyperlipidemia, diabetes,  carotid arterial disease, postoperative atrial fibrillation.  In June 2017, she presented with an anterior STEMI and was found to have significant two-vessel disease including the ostial LAD.  She underwent CABG x2 with a LIMA to the LAD and vein graft to the RPDA.  She did have postoperative atrial fibrillation which was medically managed.  She has not had recurrent atrial fibrillation and has not required long-term anticoagulation.  Most recent carotid ultrasound was performed in March of this year showing bilateral 40 to 59% ICA stenosis, which was relatively stable.  She was last seen in January of this year, at which time she was doing well.  Since her last visit, she has noted worsening bilateral lower extremity claudication involving her thighs/hips/buttocks, occurring after walking only about 15 feet, and resolving within a few minutes of rest.  Because of this, she has not been ambulating much.  Today was the first day in quite some time that she had to walk any sort of distance as she had to walk across the parking lot into the clinic.  With this, she noted significant dyspnea on exertion and also mild chest tightness that resolved with rest.  She notes that she occasionally experiences chest discomfort, but usually in the setting of stress/anxiety.  She denies palpitations, PND, orthopnea, dizziness, syncope, edema, or early satiety.  Home Medications    Prior to Admission medications   Medication Sig Start Date End Date Taking? Authorizing Provider  acetaminophen (TYLENOL) 325 MG tablet Take 2 tablets (650 mg total) by mouth  every 6 (six) hours as needed for mild pain. 10/05/15   Barrett, Erin R, PA-C  amLODipine (NORVASC) 10 MG tablet TAKE 1 TABLET EVERY DAY 09/07/19   Wellington Hampshire, MD  aspirin EC 81 MG tablet Take 81 mg by mouth daily.    [provider]  Calcium 600-200 MG-UNIT tablet Take 1 tablet by mouth 2 (two) times daily.    [provider]  carvedilol (COREG)  6.25 MG tablet TAKE 1 TABLET (6.25 MG TOTAL) BY MOUTH 2 (TWO) TIMES DAILY WITH A MEAL. 05/21/19   Wellington Hampshire, MD  docusate sodium (COLACE) 100 MG capsule Take 100 mg by mouth daily.    [provider]  hydrochlorothiazide (MICROZIDE) 12.5 MG capsule TAKE 1 CAPSULE EVERY DAY 10/01/19   Wellington Hampshire, MD  lisinopril (ZESTRIL) 40 MG tablet TAKE 1 TABLET EVERY DAY 10/24/19   Wellington Hampshire, MD  pantoprazole (PROTONIX) 40 MG tablet Take 1 tablet (40 mg total) by mouth daily. 06/18/16   Wellington Hampshire, MD  simvastatin (ZOCOR) 20 MG tablet TAKE 1 TABLET EVERY DAY AT 6PM 10/24/19   Wellington Hampshire, MD    Review of Systems    Claudication exertional chest discomfort as outlined above.  She denies palpitations, PND, orthopnea, dizziness, syncope, edema, or early satiety.  All other systems reviewed and are otherwise negative except as noted above.  Physical Exam    VS:  BP (!) 120/58 (BP Location: Left Arm, Patient Position: Sitting, Cuff Size: Large)   Pulse 61   Ht 5' (1.524 m)   Wt 195 lb (88.5 kg)   SpO2 98%   BMI 38.08 kg/m  , BMI Body mass index is 38.08 kg/m. GEN: Obese, in no acute distress. HEENT: normal. Neck: Supple, no JVD, bilateral carotid bruits noted, no masses. Cardiac: RRR, no murmurs, rubs, or gallops. No clubbing, cyanosis, edema.  Radials 2+ bilaterally.  PT 2+ bilaterally, DP 1+ bilaterally.   Respiratory:  Respirations regular and unlabored, clear to auscultation bilaterally. GI: Soft, nontender, nondistended, BS + x 4. MS: no deformity or atrophy. Skin: warm and dry, no rash. Neuro:  Strength and sensation are intact. Psych: Normal affect.  Accessory Clinical Findings    ECG personally reviewed by me today -regular sinus rhythm, 66- no acute changes.  Labs from October 29, 2019 in care everywhere-reviewed  Sodium 138, potassium 4.8, chloride 104, CO2 25.7, BUN 18, creatinine 1.4, glucose 149 Calcium 9.1, albumin 4.1, total protein 6.9 Total  bilirubin 0.6, alkaline phosphatase 84, AST 15, ALT 11 Hemoglobin 10.7, hematocrit 33.8, WBC 8.3, platelets 274 Hemoglobin A1c 7.1 Total cholesterol 133, triglycerides 187, HDL 36.9, LDL 59 TSH 1.096 Assessment & Plan    1.  Unstable angina/CAD: Status post CABG x2 in 2017.  Activity level at home is quite low and today, and walking across the parking lot, she noted significant dyspnea on exertion and also mild chest tightness that resolved with rest.  ECG is normal.  We discussed options for evaluation and agreed to pursue a Lexiscan Myoview to rule out ischemia.  She remains on aspirin, beta-blocker, statin, calcium channel blocker therapy.  I am adding sublingual nitroglycerin today.  2.  Bilateral lower extremity claudication: Patient says this has been going on for some time but has worsened over the past few months.  It does not appear that she has mentioned this previously.  She can only walk about 15 feet prior to having tightening in her thighs, hips, and buttocks,  which resolves with rest.  She is agreeable to pursuing ABI and aortoiliac duplex.  Continue aspirin and statin therapy.  3.  Essential hypertension: Stable on beta-blocker, calcium channel blocker, ACE inhibitor, and HCTZ.  4.  Hyperlipidemia: Labs checked earlier this week and notable for an LDL of 59 with normal LFTs.  She remains on statin therapy.  5.  Carotid arterial disease: Status post carotid ultrasound in March revealing stable bilateral 40-59% ICA stenosis.  Plan for follow-up in 1 year.  She remains on aspirin and statin therapy.  6.  Stage III chronic kidney disease: Recent creatinine 1.4.  She is on an ACE inhibitor.  7.  Disposition: Follow-up stress testing and ABIs.  Follow-up in clinic in 2-3 wks or sooner if necessary.   Murray Hodgkins, NP 11/02/2019, 11:11 AM

## 2019-11-02 NOTE — Patient Instructions (Signed)
Medication Instructions:  1- As needed Take NTG Place 1 tablet (0.4 mg total) under the tongue every 5 (five) minutes as needed for chest pain. Max 3 tablets. If chest pain is not relieved after 3 tablets seek urgent medical attention.   *If you need a refill on your cardiac medications before your next appointment, please call your pharmacy*   Lab Work: None ordered If you have labs (blood work) drawn today and your tests are completely normal, you will receive your results only by: Marland Kitchen MyChart Message (if you have MyChart) OR . A paper copy in the mail If you have any lab test that is abnormal or we need to change your treatment, we will call you to review the results.   Testing/Procedures: 1- Your physician has requested that you have a lower extremity arterial exercise duplex. During this test, exercise and ultrasound are used to evaluate arterial blood flow in the legs. Allow one hour for this exam. There are no restrictions or special instructions. 2- Your physician has requested that you have an ankle brachial index (ABI). During this test an ultrasound and blood pressure cuff are used to evaluate the arteries that supply the arms and legs with blood. Allow thirty minutes for this exam. There are no restrictions or special instructions.  3- [place]  Your caregiver has ordered a Stress Test with nuclear imaging. The purpose of this test is to evaluate the blood supply to your heart muscle. This procedure is referred to as a "Non-Invasive Stress Test." This is because other than having an IV started in your vein, nothing is inserted or "invades" your body. Cardiac stress tests are done to find areas of poor blood flow to the heart by determining the extent of coronary artery disease (CAD). Some patients exercise on a treadmill, which naturally increases the blood flow to your heart, while others who are  unable to walk on a treadmill due to physical limitations have a  pharmacologic/chemical stress agent called Lexiscan . This medicine will mimic walking on a treadmill by temporarily increasing your coronary blood flow.   Please note: these test may take anywhere between 2-4 hours to complete  PLEASE REPORT TO [facility] AT THE FIRST DESK WILL DIRECT YOU WHERE TO GO  Date of Procedure:_____________________________________  Arrival Time for Procedure:______________________________  Instructions regarding medication:    __x__:  Hold betablocker(s) night before procedure and morning of procedure(carvediolol)  __x__:  Hold other medications as follows:_____HCTZ hold fluid pill the morning of testing______  PLEASE NOTIFY THE OFFICE AT LEAST 24 HOURS IN ADVANCE IF YOU ARE UNABLE TO KEEP YOUR APPOINTMENT.  [phone] AND  PLEASE NOTIFY NUCLEAR MEDICINE AT [facility] AT LEAST 24 HOURS IN ADVANCE IF YOU ARE UNABLE TO KEEP YOUR APPOINTMENT. [phone]  How to prepare for your Myoview test:  1. Do not eat or drink after midnight 2. No caffeine for 24 hours prior to test 3. No smoking 24 hours prior to test. 4. Your medication may be taken with water.  If your doctor stopped a medication because of this test, do not take that medication. 5. Ladies, please do not wear dresses.  Skirts or pants are appropriate. Please wear a short sleeve shirt. 6. No perfume, cologne or lotion. 7. Wear comfortable walking shoes. No heels   Follow-Up: At [facility], you and your health needs are our priority.  As part of our continuing mission to provide you with exceptional heart care, we have  created designated Provider Care Teams.  These Care Teams include your primary Cardiologist (physician) and Advanced Practice Providers (APPs -  Physician Assistants and Nurse Practitioners) who all work together to provide you with the care you need, when you need it.  We recommend signing up for the patient portal called "MyChart".  Sign up  information is provided on this After Visit Summary.  MyChart is used to connect with patients for Virtual Visits (Telemedicine).  Patients are able to view lab/test results, encounter notes, upcoming appointments, etc.  Non-urgent messages can be sent to your provider as well.   To learn more about what you can do with MyChart, go to [email or web address].    Your next appointment:   4-6 week(s)  The format for your next appointment:   In Person  Provider:    You may see Kathlyn Sacramento, MD or Murray Hodgkins, NP

## 2019-11-05 DIAGNOSIS — I6523 Occlusion and stenosis of bilateral carotid arteries: Secondary | ICD-10-CM | POA: Diagnosis not present

## 2019-11-05 DIAGNOSIS — D649 Anemia, unspecified: Secondary | ICD-10-CM | POA: Diagnosis not present

## 2019-11-05 DIAGNOSIS — E1122 Type 2 diabetes mellitus with diabetic chronic kidney disease: Secondary | ICD-10-CM | POA: Diagnosis not present

## 2019-11-05 DIAGNOSIS — I774 Celiac artery compression syndrome: Secondary | ICD-10-CM | POA: Diagnosis not present

## 2019-11-05 DIAGNOSIS — Z0000 Encounter for general adult medical examination without abnormal findings: Secondary | ICD-10-CM | POA: Diagnosis not present

## 2019-11-05 DIAGNOSIS — I480 Paroxysmal atrial fibrillation: Secondary | ICD-10-CM | POA: Diagnosis not present

## 2019-11-05 DIAGNOSIS — N183 Chronic kidney disease, stage 3 unspecified: Secondary | ICD-10-CM | POA: Diagnosis not present

## 2019-11-05 DIAGNOSIS — I129 Hypertensive chronic kidney disease with stage 1 through stage 4 chronic kidney disease, or unspecified chronic kidney disease: Secondary | ICD-10-CM | POA: Diagnosis not present

## 2019-11-08 ENCOUNTER — Other Ambulatory Visit: Payer: Self-pay

## 2019-11-08 ENCOUNTER — Ambulatory Visit
Admission: RE | Admit: 2019-11-08 | Discharge: 2019-11-08 | Disposition: A | Discharge status: Home / Self Care | Payer: Medicare HMO | Source: Ambulatory Visit | Attending: Nurse Practitioner | Admitting: Nurse Practitioner

## 2019-11-08 DIAGNOSIS — I200 Unstable angina: Secondary | ICD-10-CM | POA: Insufficient documentation

## 2019-11-08 MED ORDER — TECHNETIUM TC 99M TETROFOSMIN IV KIT
10.0000 | PACK | Freq: Once | INTRAVENOUS | Status: AC | PRN
  Administered 2019-11-08: 10.44 via INTRAVENOUS

## 2019-11-08 MED ORDER — TECHNETIUM TC 99M TETROFOSMIN IV KIT
32.3760 | PACK | Freq: Once | INTRAVENOUS | Status: AC | PRN
  Administered 2019-11-08: 32.376 via INTRAVENOUS

## 2019-11-08 MED ORDER — REGADENOSON 0.4 MG/5ML IV SOLN
0.4000 mg | Freq: Once | INTRAVENOUS | Status: AC
  Administered 2019-11-08: 0.4 mg via INTRAVENOUS
  Filled 2019-11-08: qty 5

## 2019-11-09 LAB — NM MYOCAR MULTI W/SPECT W/WALL MOTION / EF
LV dias vol: 66 mL (ref 46–106)
LV sys vol: 17 mL
Peak HR: 99 {beats}/min
Percent HR: 66 %
Rest HR: 63 {beats}/min
SDS: 0
SRS: 7
SSS: 3
TID: 0.95

## 2019-11-12 ENCOUNTER — Telehealth: Payer: Self-pay

## 2019-11-12 DIAGNOSIS — R0600 Dyspnea, unspecified: Secondary | ICD-10-CM

## 2019-11-12 NOTE — Telephone Encounter (Signed)
-----   Message from Theora Gianotti, NP sent at 11/12/2019  7:37 AM EDT ----- No significant ischemia.  Heart squeezing function is lower than expected.  In the setting of shortness of breath with activity, recommend echocardiogram to better measure heart squeeze, as sometimes the numbers obtained on stress testing are artifactually low.

## 2019-11-12 NOTE — Telephone Encounter (Signed)
Call to patient to review stress test results.    Pt verbalized understanding and has no further questions at this time.    Advised pt to call for any further questions or concerns.  Order placed for echo.

## 2019-11-13 NOTE — Telephone Encounter (Signed)
Call to patient to review results again.    Pt verbalized understanding and has no further questions at this time.    Advised pt to call for any further questions or concerns.  No further orders.

## 2019-11-13 NOTE — Telephone Encounter (Signed)
Patient scheduled echo. But now she has more questions regarding what you meant by "heart squeezing"  Please advise

## 2019-11-16 ENCOUNTER — Other Ambulatory Visit: Payer: Self-pay | Admitting: Cardiovascular Disease

## 2019-12-04 ENCOUNTER — Other Ambulatory Visit: Payer: Self-pay | Admitting: Nurse Practitioner

## 2019-12-04 ENCOUNTER — Ambulatory Visit (INDEPENDENT_AMBULATORY_CARE_PROVIDER_SITE_OTHER): Payer: Medicare HMO

## 2019-12-04 ENCOUNTER — Other Ambulatory Visit: Payer: Self-pay

## 2019-12-04 DIAGNOSIS — R0600 Dyspnea, unspecified: Secondary | ICD-10-CM

## 2019-12-04 DIAGNOSIS — I739 Peripheral vascular disease, unspecified: Secondary | ICD-10-CM

## 2019-12-04 LAB — ECHOCARDIOGRAM COMPLETE
AR max vel: 1.74 cm2
AV Area VTI: 1.71 cm2
AV Area mean vel: 1.79 cm2
AV Mean grad: 5 mmHg
AV Peak grad: 9.6 mmHg
Ao pk vel: 1.55 m/s
Area-P 1/2: 4.06 cm2
P 1/2 time: 399 msec
S' Lateral: 2.5 cm

## 2019-12-04 MED ORDER — PERFLUTREN LIPID MICROSPHERE
1.0000 mL | INTRAVENOUS | Status: AC | PRN
  Administered 2019-12-04: 2 mL via INTRAVENOUS

## 2019-12-05 ENCOUNTER — Telehealth: Payer: Self-pay

## 2019-12-05 NOTE — Telephone Encounter (Signed)
-----   Message from Theora Gianotti, NP sent at 12/05/2019  7:46 AM EDT ----- Aortoiliac duplex abnormal with suggestion of >50% stenoses involving the distal Aorta and iliacs.  She is currently scheduled for f/u with me on 8/26, however given this finding and significant claudication, I'd like her to see Dr. Fletcher Anon instead.

## 2019-12-05 NOTE — Telephone Encounter (Signed)
Theora Gianotti, NP  Alroy Dust.Jordin Dambrosio, RN Heart squeezing function is normal, though the heart is moderately stiff, which can contribute to shortness of breath. The goal in managing heart muscle stiffness is good blood pressure and heart rate mgmt (both fine at clinic visit), limiting salt intake, and watching weights daily to detect small changes that might indicate excess fluid (which will make shortness of breath worse). F/u with Dr. Fletcher Anon as indicated in [place] result note.    Call to patient to review ultrasound results.    Pt verbalized understanding and has no further questions at this time.    Advised pt to call for any further questions or concerns.  appt changed to same day with Dr. Fletcher Anon.

## 2019-12-07 ENCOUNTER — Other Ambulatory Visit: Payer: Self-pay | Admitting: Cardiovascular Disease

## 2019-12-13 ENCOUNTER — Ambulatory Visit: Payer: Medicare HMO | Admitting: Cardiovascular Disease

## 2019-12-13 ENCOUNTER — Other Ambulatory Visit: Payer: Self-pay

## 2019-12-13 ENCOUNTER — Encounter: Payer: Self-pay | Admitting: Cardiovascular Disease

## 2019-12-13 ENCOUNTER — Ambulatory Visit: Payer: Medicare HMO | Admitting: Nurse Practitioner

## 2019-12-13 VITALS — BP 134/56 | HR 62 | Ht 60.0 in | Wt 191.6 lb

## 2019-12-13 DIAGNOSIS — I10 Essential (primary) hypertension: Secondary | ICD-10-CM | POA: Diagnosis not present

## 2019-12-13 DIAGNOSIS — I739 Peripheral vascular disease, unspecified: Secondary | ICD-10-CM

## 2019-12-13 DIAGNOSIS — E785 Hyperlipidemia, unspecified: Secondary | ICD-10-CM

## 2019-12-13 DIAGNOSIS — Z951 Presence of aortocoronary bypass graft: Secondary | ICD-10-CM | POA: Diagnosis not present

## 2019-12-13 NOTE — Progress Notes (Signed)
[id number]    Cardiology Office Note   Date:  12/13/2019   ID:  Kerri, Hicks 09-Sep-1945, MRN [id number]  PCP:  Kerri Hector, MD  Cardiologist:   Kerri Sacramento, MD   Chief Complaint  Patient presents with   OTHER    Follow up for abnormal ultrasound. Medications verbally reviewed with patient.       History of Present Illness: Kerri Hicks is a 74 y.o. female who presents for a follow-up visit regarding coronary artery disease status post CABG in June of 2017. She presented with anterior ST elevation myocardial infarction and was found to have significant two-vessel coronary artery disease including ostial LAD with normal ejection fraction. She underwent CABG 2 with LIMA to LAD and SVG to right PDA. She had postoperative atrial fibrillation. She is known to have moderate bilateral carotid disease, hyperlipidemia and previous tobacco use. She was seen by Kerri Hicks in July for chest pain and shortness of breath.  She underwent a Lexiscan Myoview which showed no evidence of ischemia.  There was anterior wall defect suspicious for breast attenuation artifact.  The patient was given sublingual nitroglycerin but she has not had to use it due to lack of recurrent symptoms. She also complained of bilateral leg pain and heaviness with walking and thus she was referred for noninvasive vascular studies.  ABI was normal bilaterally.  However, duplex showed evidence of borderline significant disease affecting the distal aorta into bilateral common iliac arteries. In terms of claudication, she reports inconsistent symptoms.  She was able to [place] from [place] medical mall to our clinic today without having any discomfort or the need to rest.  Some other days she has symptoms.  She has no rest pain or lower extremity ulceration.  Past Medical History:  Diagnosis Date   Carotid arterial disease ([place])    a. 10/2016 Carotid U/S: RICA 4-78, LICA 29-56, bilat ECAs >50; b. 06/2019 U/S: bilat ICA  40-59%, bilat ECA >50%-->f/u 1 yr.   Celiac artery stenosis ([place])    a. 10/2018 Abd duplex: 70-99% celiac artery stenosis.   Coronary artery disease    a. 09/2015 Anterior STEMI w/ ostial LAD disease-->CABG x 2 (LIMA to LAD and SVG to RPDA).   Diabetes mellitus without complication ([place])    History of echocardiogram    a. 09/2015 TEE (intraop): EF 50-55%.   Hyperlipidemia    Hypertension    a. 10/2018 Renal artery duplex: 1-59% bilat RA stenosis.    Past Surgical History:  Procedure Laterality Date   ANKLE SURGERY Left    CARDIAC CATHETERIZATION N/A 09/30/2015   Procedure: Left Heart Cath and Coronary Angiography;  Surgeon: Leonie Man, MD;  Location: [facility];  Service: Cardiovascular;  Laterality: N/A;   CORONARY ARTERY BYPASS GRAFT N/A 09/30/2015   Procedure: CORONARY ARTERY BYPASS GRAFTING (CABG) TIMES TWO USING LEFT INTERNAL MAMMARY ARTERY AND ENDOSCOPICALLY HARVESTED RIGHT SAPHENOUS VEIN GRAFT;  Surgeon: Kerri Pollack, MD;  Location: [facility];  Service: Open Heart Surgery;  Laterality: N/A;     Current Outpatient Medications  Medication Sig Dispense Refill   acetaminophen (TYLENOL) 325 MG tablet Take 2 tablets (650 mg total) by mouth every 6 (six) hours as needed for mild pain.     amLODipine (NORVASC) 10 MG tablet TAKE 1 TABLET EVERY DAY 90 tablet 1   aspirin EC 81 MG tablet Take 81 mg by mouth daily.     Calcium 600-200 MG-UNIT tablet Take 1 tablet  by mouth 2 (two) times daily.     carvedilol (COREG) 6.25 MG tablet TAKE 1 TABLET (6.25 MG TOTAL) BY MOUTH 2 (TWO) TIMES DAILY WITH A MEAL. 180 tablet 2   diphenhydrAMINE HCl (BENADRYL ALLERGY PO) Take by mouth as needed.     docusate sodium (COLACE) 100 MG capsule Take 100 mg by mouth daily.     hydrochlorothiazide (MICROZIDE) 12.5 MG capsule TAKE 1 CAPSULE EVERY DAY 90 capsule 0   lisinopril (ZESTRIL) 40 MG tablet TAKE 1 TABLET EVERY DAY 90 tablet 0   nitroGLYCERIN (NITROSTAT) 0.4 MG SL tablet Place 1  tablet (0.4 mg total) under the tongue every 5 (five) minutes as needed for chest pain. 90 tablet 3   pantoprazole (PROTONIX) 40 MG tablet Take 1 tablet (40 mg total) by mouth daily. 30 tablet 0   simvastatin (ZOCOR) 20 MG tablet TAKE 1 TABLET EVERY DAY AT 6PM 90 tablet 0   No current facility-administered medications for this visit.    Allergies:   Penicillins and Penicillin g    Social History:  The patient  reports that she quit smoking about 4 years ago. Her smoking use included cigarettes. She has a 50.00 pack-year smoking history. She has never used smokeless tobacco. She reports current alcohol use. She reports that she does not use drugs.   Family History:  The patient's family history includes Heart attack in her mother; Heart disease in her brother and mother; Leukemia in her father.    ROS:  Please see the history of present illness.   Otherwise, review of systems are positive for none.   All other systems are reviewed and negative.    PHYSICAL EXAM: VS:  BP (!) 134/56 (BP Location: Left Arm, Patient Position: Sitting, Cuff Size: Normal)    Pulse 62    Ht 5' (1.524 m)    Wt 191 lb 9.6 oz (86.9 kg)    SpO2 98%    BMI 37.42 kg/m  , BMI Body mass index is 37.42 kg/m. GEN: Well nourished, well developed, in no acute distress  HEENT: normal  Neck: no JVD or masses. Bilateral carotid bruits louder on the right side Cardiac: RRR; no rubs, or gallops . 2/ 6 systolic ejection murmur in the aortic area.  No significant edema.  Respiratory:  clear to auscultation bilaterally, normal work of breathing GI: soft, nontender, nondistended, + BS MS: no deformity or atrophy  Skin: warm and dry, no rash Neuro:  Strength and sensation are intact Psych: euthymic mood, full affect Vascular: Femoral pulses slightly diminished bilaterally.  Distal pulses are palpable.  EKG:  EKG is ordered today. The ekg ordered today demonstrates normal sinus rhythm with poor R wave progression in the  anterior leads.   Recent Labs: No results found for requested labs within last 8760 hours.    Lipid Panel    Component Value Date/Time   CHOL 147 12/02/2015 0849   TRIG 130 12/02/2015 0849   HDL 51 12/02/2015 0849   CHOLHDL 2.9 12/02/2015 0849   VLDL 26 12/02/2015 0849   LDLCALC 70 12/02/2015 0849      Wt Readings from Last 3 Encounters:  12/13/19 191 lb 9.6 oz (86.9 kg)  11/02/19 195 lb (88.5 kg)  05/04/19 188 lb 4 oz (85.4 kg)        ASSESSMENT AND PLAN:  1.  Coronary artery disease involving native coronary arteries without angina: She had recent chest pain in July with no recurrent symptoms since then.  Lexiscan  Myoview showed no evidence of ischemia.  Recommend continuing medical therapy.     2. Bilateral carotid disease: This was moderate on most recent echocardiogram.  Recommend repeat study in March 2022  3. Hyperlipidemia: She is currently on simvastatin 20 mg once daily.  Recent lipid profile showed an LDL of 51 and triglyceride of 116.    4. Essential Hypertension: Blood pressure is well controlled on current medications  5.  Peripheral arterial disease: The patient has evidence of distal aortic disease extending into the common iliac arteries bilaterally.  She does have claudication but her symptoms overall seems to be mild to moderate and currently not lifestyle limiting.  I encouraged her to continue walking and monitor her symptoms.  If there is worsening, angiography and endovascular intervention can be considered.    Disposition:   FU with me in 6 months  Signed,  Kerri Sacramento, MD  12/13/2019 1:52 PM    [facility]

## 2019-12-13 NOTE — Patient Instructions (Signed)

## 2019-12-17 DIAGNOSIS — H353132 Nonexudative age-related macular degeneration, bilateral, intermediate dry stage: Secondary | ICD-10-CM | POA: Diagnosis not present

## 2019-12-19 ENCOUNTER — Other Ambulatory Visit: Payer: Self-pay | Admitting: Cardiovascular Disease

## 2020-01-01 ENCOUNTER — Other Ambulatory Visit: Payer: Self-pay | Admitting: Cardiovascular Disease

## 2020-01-28 ENCOUNTER — Other Ambulatory Visit: Payer: Self-pay | Admitting: Ophthalmology

## 2020-01-31 DIAGNOSIS — D649 Anemia, unspecified: Secondary | ICD-10-CM | POA: Diagnosis not present

## 2020-01-31 DIAGNOSIS — E118 Type 2 diabetes mellitus with unspecified complications: Secondary | ICD-10-CM | POA: Diagnosis not present

## 2020-01-31 DIAGNOSIS — N1832 Chronic kidney disease, stage 3b: Secondary | ICD-10-CM | POA: Diagnosis not present

## 2020-02-04 DIAGNOSIS — H2512 Age-related nuclear cataract, left eye: Secondary | ICD-10-CM | POA: Diagnosis not present

## 2020-02-04 DIAGNOSIS — I2510 Atherosclerotic heart disease of native coronary artery without angina pectoris: Secondary | ICD-10-CM | POA: Diagnosis not present

## 2020-02-07 DIAGNOSIS — Z23 Encounter for immunization: Secondary | ICD-10-CM | POA: Diagnosis not present

## 2020-02-07 DIAGNOSIS — I6523 Occlusion and stenosis of bilateral carotid arteries: Secondary | ICD-10-CM | POA: Diagnosis not present

## 2020-02-07 DIAGNOSIS — I2510 Atherosclerotic heart disease of native coronary artery without angina pectoris: Secondary | ICD-10-CM | POA: Diagnosis not present

## 2020-02-07 DIAGNOSIS — N1832 Chronic kidney disease, stage 3b: Secondary | ICD-10-CM | POA: Diagnosis not present

## 2020-02-07 DIAGNOSIS — I129 Hypertensive chronic kidney disease with stage 1 through stage 4 chronic kidney disease, or unspecified chronic kidney disease: Secondary | ICD-10-CM | POA: Diagnosis not present

## 2020-02-07 DIAGNOSIS — E1122 Type 2 diabetes mellitus with diabetic chronic kidney disease: Secondary | ICD-10-CM | POA: Diagnosis not present

## 2020-02-07 DIAGNOSIS — I480 Paroxysmal atrial fibrillation: Secondary | ICD-10-CM | POA: Diagnosis not present

## 2020-02-07 DIAGNOSIS — I774 Celiac artery compression syndrome: Secondary | ICD-10-CM | POA: Diagnosis not present

## 2020-02-07 DIAGNOSIS — E7800 Pure hypercholesterolemia, unspecified: Secondary | ICD-10-CM | POA: Diagnosis not present

## 2020-02-13 ENCOUNTER — Other Ambulatory Visit: Payer: Self-pay | Admitting: Cardiovascular Disease

## 2020-02-14 ENCOUNTER — Encounter: Payer: Self-pay | Admitting: Ophthalmology

## 2020-02-14 ENCOUNTER — Other Ambulatory Visit: Payer: Self-pay

## 2020-02-18 ENCOUNTER — Other Ambulatory Visit: Payer: Self-pay

## 2020-02-18 ENCOUNTER — Other Ambulatory Visit
Admission: RE | Admit: 2020-02-18 | Discharge: 2020-02-18 | Disposition: A | Discharge status: Home / Self Care | Payer: Medicare HMO | Source: Ambulatory Visit | Attending: Ophthalmology | Admitting: Ophthalmology

## 2020-02-18 DIAGNOSIS — Z20822 Contact with and (suspected) exposure to covid-19: Secondary | ICD-10-CM | POA: Diagnosis not present

## 2020-02-18 DIAGNOSIS — Z01812 Encounter for preprocedural laboratory examination: Secondary | ICD-10-CM | POA: Diagnosis not present

## 2020-02-19 LAB — SARS CORONAVIRUS 2 (TAT 6-24 HRS): SARS Coronavirus 2: NEGATIVE

## 2020-02-19 NOTE — Discharge Instructions (Signed)

## 2020-02-20 ENCOUNTER — Ambulatory Visit: Payer: Medicare HMO | Admitting: Anesthesiology

## 2020-02-20 ENCOUNTER — Other Ambulatory Visit: Payer: Self-pay

## 2020-02-20 ENCOUNTER — Ambulatory Visit
Admission: RE | Admit: 2020-02-20 | Discharge: 2020-02-20 | Disposition: A | Discharge status: Home / Self Care | Payer: Medicare HMO | Attending: Ophthalmology | Admitting: Ophthalmology

## 2020-02-20 ENCOUNTER — Encounter
Admission: RE | Disposition: A | Discharge status: Home / Self Care | Payer: Self-pay | Source: Home / Self Care | Attending: Ophthalmology

## 2020-02-20 ENCOUNTER — Encounter: Payer: Self-pay | Admitting: Ophthalmology

## 2020-02-20 DIAGNOSIS — Z951 Presence of aortocoronary bypass graft: Secondary | ICD-10-CM | POA: Diagnosis not present

## 2020-02-20 DIAGNOSIS — H2512 Age-related nuclear cataract, left eye: Secondary | ICD-10-CM | POA: Insufficient documentation

## 2020-02-20 DIAGNOSIS — I2510 Atherosclerotic heart disease of native coronary artery without angina pectoris: Secondary | ICD-10-CM | POA: Diagnosis not present

## 2020-02-20 DIAGNOSIS — Z87891 Personal history of nicotine dependence: Secondary | ICD-10-CM | POA: Insufficient documentation

## 2020-02-20 DIAGNOSIS — N189 Chronic kidney disease, unspecified: Secondary | ICD-10-CM | POA: Insufficient documentation

## 2020-02-20 DIAGNOSIS — H25812 Combined forms of age-related cataract, left eye: Secondary | ICD-10-CM | POA: Diagnosis not present

## 2020-02-20 DIAGNOSIS — I129 Hypertensive chronic kidney disease with stage 1 through stage 4 chronic kidney disease, or unspecified chronic kidney disease: Secondary | ICD-10-CM | POA: Insufficient documentation

## 2020-02-20 DIAGNOSIS — I252 Old myocardial infarction: Secondary | ICD-10-CM | POA: Diagnosis not present

## 2020-02-20 DIAGNOSIS — Z880 Allergy status to penicillin: Secondary | ICD-10-CM | POA: Insufficient documentation

## 2020-02-20 DIAGNOSIS — E7800 Pure hypercholesterolemia, unspecified: Secondary | ICD-10-CM | POA: Insufficient documentation

## 2020-02-20 HISTORY — PX: CATARACT EXTRACTION W/PHACO: SHX586

## 2020-02-20 SURGERY — PHACOEMULSIFICATION, CATARACT, WITH IOL INSERTION
Anesthesia: Monitor Anesthesia Care | Site: Eye | Laterality: Left

## 2020-02-20 MED ORDER — FENTANYL CITRATE (PF) 100 MCG/2ML IJ SOLN
INTRAMUSCULAR | Status: DC | PRN
  Administered 2020-02-20 (×2): 50 ug via INTRAVENOUS

## 2020-02-20 MED ORDER — BRIMONIDINE TARTRATE-TIMOLOL 0.2-0.5 % OP SOLN
OPHTHALMIC | Status: DC | PRN
  Administered 2020-02-20: 1 [drp] via OPHTHALMIC

## 2020-02-20 MED ORDER — LIDOCAINE HCL (PF) 2 % IJ SOLN
INTRAOCULAR | Status: DC | PRN
  Administered 2020-02-20: 1 mL

## 2020-02-20 MED ORDER — ARMC OPHTHALMIC DILATING DROPS
1.0000 "application " | OPHTHALMIC | Status: DC | PRN
  Administered 2020-02-20 (×3): 1 via OPHTHALMIC

## 2020-02-20 MED ORDER — EPINEPHRINE PF 1 MG/ML IJ SOLN
INTRAOCULAR | Status: DC | PRN
  Administered 2020-02-20: 96 mL via OPHTHALMIC

## 2020-02-20 MED ORDER — NA HYALUR & NA CHOND-NA HYALUR 0.4-0.35 ML IO KIT
PACK | INTRAOCULAR | Status: DC | PRN
  Administered 2020-02-20: 1 mL via INTRAOCULAR

## 2020-02-20 MED ORDER — MOXIFLOXACIN HCL 0.5 % OP SOLN
1.0000 [drp] | OPHTHALMIC | Status: DC | PRN
  Administered 2020-02-20 (×3): 1 [drp] via OPHTHALMIC

## 2020-02-20 MED ORDER — TETRACAINE HCL 0.5 % OP SOLN
1.0000 [drp] | OPHTHALMIC | Status: DC | PRN
  Administered 2020-02-20 (×3): 1 [drp] via OPHTHALMIC

## 2020-02-20 MED ORDER — MOXIFLOXACIN HCL 0.5 % OP SOLN
OPHTHALMIC | Status: DC | PRN
  Administered 2020-02-20: 0.2 mL via OPHTHALMIC

## 2020-02-20 MED ORDER — LACTATED RINGERS IV SOLN: INTRAVENOUS | Status: DC

## 2020-02-20 MED ORDER — MIDAZOLAM HCL 2 MG/2ML IJ SOLN
INTRAMUSCULAR | Status: DC | PRN
  Administered 2020-02-20 (×2): 1 mg via INTRAVENOUS

## 2020-02-20 SURGICAL SUPPLY — 23 items
CANNULA ANT/CHMB 27G (MISCELLANEOUS) ×1 IMPLANT
CANNULA ANT/CHMB 27GA (MISCELLANEOUS) ×2 IMPLANT
GLOVE SURG LX 7.5 STRW (GLOVE) ×1
GLOVE SURG LX STRL 7.5 STRW (GLOVE) ×1 IMPLANT
GLOVE SURG TRIUMPH 8.0 PF LTX (GLOVE) ×2 IMPLANT
GOWN STRL REUS W/ TWL LRG LVL3 (GOWN DISPOSABLE) ×2 IMPLANT
GOWN STRL REUS W/TWL LRG LVL3 (GOWN DISPOSABLE) ×4
LENS IOL DIOP 21.5 (Intraocular Lens) ×2 IMPLANT
LENS IOL TECNIS MONO 21.5 (Intraocular Lens) IMPLANT
MARKER SKIN DUAL TIP RULER LAB (MISCELLANEOUS) ×2 IMPLANT
NDL CAPSULORHEX 25GA (NEEDLE) ×1 IMPLANT
NDL FILTER BLUNT 18X1 1/2 (NEEDLE) ×2 IMPLANT
NEEDLE CAPSULORHEX 25GA (NEEDLE) ×2 IMPLANT
NEEDLE FILTER BLUNT 18X 1/2SAF (NEEDLE) ×2
NEEDLE FILTER BLUNT 18X1 1/2 (NEEDLE) ×2 IMPLANT
PACK CATARACT BRASINGTON (MISCELLANEOUS) ×2 IMPLANT
PACK EYE AFTER SURG (MISCELLANEOUS) ×2 IMPLANT
PACK OPTHALMIC (MISCELLANEOUS) ×2 IMPLANT
SOLUTION OPHTHALMIC SALT (MISCELLANEOUS) ×2 IMPLANT
SYR 3ML LL SCALE MARK (SYRINGE) ×4 IMPLANT
SYR TB 1ML LUER SLIP (SYRINGE) ×2 IMPLANT
WATER STERILE IRR 250ML POUR (IV SOLUTION) ×2 IMPLANT
WIPE NON LINTING 3.25X3.25 (MISCELLANEOUS) ×2 IMPLANT

## 2020-02-20 NOTE — Anesthesia Postprocedure Evaluation (Signed)
Anesthesia Post Note  Patient: Kerri Hicks  Procedure(s) Performed: CATARACT EXTRACTION PHACO AND INTRAOCULAR LENS PLACEMENT (IOC) LEFT 10.47 01:32.2 11.3% (Left Eye)     Patient location during evaluation: PACU Anesthesia Type: MAC Level of consciousness: awake and alert Pain management: pain level controlled Vital Signs Assessment: post-procedure vital signs reviewed and stable Respiratory status: spontaneous breathing Cardiovascular status: stable Anesthetic complications: no   No complications documented.  Gillian Scarce

## 2020-02-20 NOTE — Anesthesia Preprocedure Evaluation (Signed)
Anesthesia Evaluation  Patient identified by MRN, date of birth, ID band Patient awake    Reviewed: Allergy & Precautions, H&P , NPO status , Patient's Chart, lab work & pertinent test results  Airway Mallampati: II  TM Distance: >3 FB Neck ROM: full    Dental no notable dental hx.    Pulmonary neg pulmonary ROS, former smoker,    Pulmonary exam normal        Cardiovascular hypertension, + CAD, + Past MI and + CABG  Normal cardiovascular exam Rhythm:regular Rate:Normal     Neuro/Psych negative neurological ROS     GI/Hepatic negative GI ROS, Neg liver ROS,   Endo/Other  diabetes, Well Controlled, Type 2  Renal/GU   negative genitourinary   Musculoskeletal   Abdominal   Peds  Hematology negative hematology ROS (+)   Anesthesia Other Findings   Reproductive/Obstetrics negative OB ROS                             Anesthesia Physical Anesthesia Plan  ASA: III  Anesthesia Plan: MAC   Post-op Pain Management:    Induction:   PONV Risk Score and Plan:   Airway Management Planned:   Additional Equipment:   Intra-op Plan:   Post-operative Plan:   Informed Consent: I have reviewed the patients History and Physical, chart, labs and discussed the procedure including the risks, benefits and alternatives for the proposed anesthesia with the patient or authorized representative who has indicated his/her understanding and acceptance.       Plan Discussed with:   Anesthesia Plan Comments:         Anesthesia Quick Evaluation

## 2020-02-20 NOTE — Transfer of Care (Signed)
Immediate Anesthesia Transfer of Care Note  Patient: Kerri Hicks  Procedure(s) Performed: CATARACT EXTRACTION PHACO AND INTRAOCULAR LENS PLACEMENT (IOC) LEFT 10.47 01:32.2 11.3% (Left Eye)  Patient Location: PACU  Anesthesia Type: MAC  Level of Consciousness: awake, alert  and patient cooperative  Airway and Oxygen Therapy: Patient Spontanous Breathing and Patient connected to supplemental oxygen  Post-op Assessment: Post-op Vital signs reviewed, Patient's Cardiovascular Status Stable, Respiratory Function Stable, Patent Airway and No signs of Nausea or vomiting  Post-op Vital Signs: Reviewed and stable  Complications: No complications documented.

## 2020-02-20 NOTE — Op Note (Signed)
OPERATIVE NOTE  Kerri Hicks [id number] 02/20/2020   PREOPERATIVE DIAGNOSIS:  Nuclear sclerotic cataract left eye. H25.12   POSTOPERATIVE DIAGNOSIS:    Nuclear sclerotic cataract left eye.     PROCEDURE:  Phacoemusification with posterior chamber intraocular lens placement of the left eye  Ultrasound time: Procedure(s): CATARACT EXTRACTION PHACO AND INTRAOCULAR LENS PLACEMENT (IOC) LEFT 10.47 01:32.2 11.3% (Left)  LENS:   Implant Name Type Inv. Item Serial No. Manufacturer Lot No. LRB No. Used Action  LENS IOL DIOP 21.5 - [id number] Intraocular Lens LENS IOL DIOP 21.5 [id number] [id number]   Left 1 Implanted      SURGEON:  Wyonia Hough, MD   ANESTHESIA:  Topical with tetracaine drops and 2% Xylocaine jelly, augmented with 1% preservative-free intracameral lidocaine.    COMPLICATIONS:  None.   DESCRIPTION OF PROCEDURE:  The patient was identified in the holding room and transported to the operating room and placed in the supine position under the operating microscope.  The left eye was identified as the operative eye and it was prepped and draped in the usual sterile ophthalmic fashion.   A 1 millimeter clear-corneal paracentesis was made at the 1:30 position.  0.5 ml of preservative-free 1% lidocaine was injected into the anterior chamber.  The anterior chamber was filled with Viscoat viscoelastic.  A 2.4 millimeter keratome was used to make a near-clear corneal incision at the 10:30 position.  .  A curvilinear capsulorrhexis was made with a cystotome and capsulorrhexis forceps.  Balanced salt solution was used to hydrodissect and hydrodelineate the nucleus.   Phacoemulsification was then used in stop and chop fashion to remove the lens nucleus and epinucleus.  The remaining cortex was then removed using the irrigation and aspiration handpiece. Provisc was then placed into the capsular bag to distend it for lens placement.  A lens was then injected into the capsular bag.   The remaining viscoelastic was aspirated.   Wounds were hydrated with balanced salt solution.  The anterior chamber was inflated to a physiologic pressure with balanced salt solution.  No wound leaks were noted. Vigamox 0.2 ml of a 1mg  per ml solution was injected into the anterior chamber for a dose of 0.2 mg of intracameral antibiotic at the completion of the case.   Timolol and Brimonidine drops were applied to the eye.  The patient was taken to the recovery room in stable condition without complications of anesthesia or surgery.  [name] 02/20/2020, 12:17 PM

## 2020-02-20 NOTE — H&P (Signed)

## 2020-02-20 NOTE — Anesthesia Procedure Notes (Signed)
Procedure Name: MAC Date/Time: 02/20/2020 11:54 AM Performed by: Cameron Ali, CRNA Pre-anesthesia Checklist: Patient identified, Emergency Drugs available, Suction available, Timeout performed and Patient being monitored Patient Re-evaluated:Patient Re-evaluated prior to induction Oxygen Delivery Method: Nasal cannula Placement Confirmation: positive ETCO2

## 2020-02-21 ENCOUNTER — Encounter: Payer: Self-pay | Admitting: Ophthalmology

## 2020-02-27 ENCOUNTER — Other Ambulatory Visit: Payer: Self-pay | Admitting: Ophthalmology

## 2020-02-29 DIAGNOSIS — I10 Essential (primary) hypertension: Secondary | ICD-10-CM | POA: Diagnosis not present

## 2020-02-29 DIAGNOSIS — H2511 Age-related nuclear cataract, right eye: Secondary | ICD-10-CM | POA: Diagnosis not present

## 2020-03-03 ENCOUNTER — Encounter: Payer: Self-pay | Admitting: Ophthalmology

## 2020-03-10 ENCOUNTER — Other Ambulatory Visit: Payer: Self-pay

## 2020-03-10 ENCOUNTER — Other Ambulatory Visit
Admission: RE | Admit: 2020-03-10 | Discharge: 2020-03-10 | Disposition: A | Discharge status: Home / Self Care | Payer: Medicare HMO | Source: Ambulatory Visit | Attending: Ophthalmology | Admitting: Ophthalmology

## 2020-03-10 DIAGNOSIS — Z20822 Contact with and (suspected) exposure to covid-19: Secondary | ICD-10-CM | POA: Diagnosis not present

## 2020-03-10 DIAGNOSIS — Z01812 Encounter for preprocedural laboratory examination: Secondary | ICD-10-CM | POA: Diagnosis not present

## 2020-03-10 LAB — SARS CORONAVIRUS 2 (TAT 6-24 HRS): SARS Coronavirus 2: NEGATIVE

## 2020-03-10 NOTE — Discharge Instructions (Signed)

## 2020-03-12 ENCOUNTER — Encounter
Admission: RE | Disposition: A | Discharge status: Home / Self Care | Payer: Self-pay | Source: Home / Self Care | Attending: Ophthalmology

## 2020-03-12 ENCOUNTER — Ambulatory Visit: Payer: Medicare HMO | Admitting: Anesthesiology

## 2020-03-12 ENCOUNTER — Ambulatory Visit
Admission: RE | Admit: 2020-03-12 | Discharge: 2020-03-12 | Disposition: A | Discharge status: Home / Self Care | Payer: Medicare HMO | Attending: Ophthalmology | Admitting: Ophthalmology

## 2020-03-12 ENCOUNTER — Other Ambulatory Visit: Payer: Self-pay

## 2020-03-12 ENCOUNTER — Encounter: Payer: Self-pay | Admitting: Ophthalmology

## 2020-03-12 DIAGNOSIS — H25811 Combined forms of age-related cataract, right eye: Secondary | ICD-10-CM | POA: Diagnosis not present

## 2020-03-12 DIAGNOSIS — Z7982 Long term (current) use of aspirin: Secondary | ICD-10-CM | POA: Diagnosis not present

## 2020-03-12 DIAGNOSIS — Z87891 Personal history of nicotine dependence: Secondary | ICD-10-CM | POA: Diagnosis not present

## 2020-03-12 DIAGNOSIS — Z79899 Other long term (current) drug therapy: Secondary | ICD-10-CM | POA: Diagnosis not present

## 2020-03-12 DIAGNOSIS — H2511 Age-related nuclear cataract, right eye: Secondary | ICD-10-CM | POA: Insufficient documentation

## 2020-03-12 DIAGNOSIS — Z880 Allergy status to penicillin: Secondary | ICD-10-CM | POA: Diagnosis not present

## 2020-03-12 DIAGNOSIS — I252 Old myocardial infarction: Secondary | ICD-10-CM | POA: Diagnosis not present

## 2020-03-12 DIAGNOSIS — Z951 Presence of aortocoronary bypass graft: Secondary | ICD-10-CM | POA: Diagnosis not present

## 2020-03-12 DIAGNOSIS — E1136 Type 2 diabetes mellitus with diabetic cataract: Secondary | ICD-10-CM | POA: Insufficient documentation

## 2020-03-12 HISTORY — PX: CATARACT EXTRACTION W/PHACO: SHX586

## 2020-03-12 SURGERY — PHACOEMULSIFICATION, CATARACT, WITH IOL INSERTION
Anesthesia: Monitor Anesthesia Care | Site: Eye | Laterality: Right

## 2020-03-12 MED ORDER — FENTANYL CITRATE (PF) 100 MCG/2ML IJ SOLN
INTRAMUSCULAR | Status: DC | PRN
  Administered 2020-03-12 (×2): 50 ug via INTRAVENOUS

## 2020-03-12 MED ORDER — MOXIFLOXACIN HCL 0.5 % OP SOLN
OPHTHALMIC | Status: DC | PRN
  Administered 2020-03-12: 0.2 mL via OPHTHALMIC

## 2020-03-12 MED ORDER — LIDOCAINE HCL (PF) 2 % IJ SOLN
INTRAOCULAR | Status: DC | PRN
  Administered 2020-03-12: 1 mL

## 2020-03-12 MED ORDER — MIDAZOLAM HCL 2 MG/2ML IJ SOLN
INTRAMUSCULAR | Status: DC | PRN
  Administered 2020-03-12 (×2): 1 mg via INTRAVENOUS

## 2020-03-12 MED ORDER — ACETAMINOPHEN 325 MG PO TABS: 325.0000 mg | ORAL_TABLET | ORAL | Status: DC | PRN

## 2020-03-12 MED ORDER — ACETAMINOPHEN 160 MG/5ML PO SOLN: 325.0000 mg | ORAL | Status: DC | PRN

## 2020-03-12 MED ORDER — BRIMONIDINE TARTRATE-TIMOLOL 0.2-0.5 % OP SOLN
OPHTHALMIC | Status: DC | PRN
  Administered 2020-03-12: 1 [drp] via OPHTHALMIC

## 2020-03-12 MED ORDER — LACTATED RINGERS IV SOLN: INTRAVENOUS | Status: DC

## 2020-03-12 MED ORDER — ONDANSETRON HCL 4 MG/2ML IJ SOLN: 4.0000 mg | Freq: Once | INTRAMUSCULAR | Status: DC | PRN

## 2020-03-12 MED ORDER — EPINEPHRINE PF 1 MG/ML IJ SOLN
INTRAOCULAR | Status: DC | PRN
  Administered 2020-03-12: 73 mL via OPHTHALMIC

## 2020-03-12 MED ORDER — ARMC OPHTHALMIC DILATING DROPS
1.0000 "application " | OPHTHALMIC | Status: DC | PRN
  Administered 2020-03-12 (×3): 1 via OPHTHALMIC

## 2020-03-12 MED ORDER — TETRACAINE HCL 0.5 % OP SOLN
1.0000 [drp] | OPHTHALMIC | Status: DC | PRN
  Administered 2020-03-12 (×3): 1 [drp] via OPHTHALMIC

## 2020-03-12 MED ORDER — NA HYALUR & NA CHOND-NA HYALUR 0.4-0.35 ML IO KIT
PACK | INTRAOCULAR | Status: DC | PRN
  Administered 2020-03-12: 1 mL via INTRAOCULAR

## 2020-03-12 SURGICAL SUPPLY — 22 items
CANNULA ANT/CHMB 27G (MISCELLANEOUS) ×1 IMPLANT
CANNULA ANT/CHMB 27GA (MISCELLANEOUS) ×2 IMPLANT
GLOVE SURG LX 7.5 STRW (GLOVE) ×1
GLOVE SURG LX STRL 7.5 STRW (GLOVE) ×1 IMPLANT
GLOVE SURG TRIUMPH 8.0 PF LTX (GLOVE) ×2 IMPLANT
GOWN STRL REUS W/ TWL LRG LVL3 (GOWN DISPOSABLE) ×2 IMPLANT
GOWN STRL REUS W/TWL LRG LVL3 (GOWN DISPOSABLE) ×2
LENS IOL DIOP 21.0 (Intraocular Lens) ×2 IMPLANT
LENS IOL TECNIS MONO 21.0 (Intraocular Lens) IMPLANT
MARKER SKIN DUAL TIP RULER LAB (MISCELLANEOUS) ×2 IMPLANT
NDL CAPSULORHEX 25GA (NEEDLE) ×1 IMPLANT
NDL FILTER BLUNT 18X1 1/2 (NEEDLE) ×2 IMPLANT
NEEDLE CAPSULORHEX 25GA (NEEDLE) ×2 IMPLANT
NEEDLE FILTER BLUNT 18X 1/2SAF (NEEDLE) ×2
NEEDLE FILTER BLUNT 18X1 1/2 (NEEDLE) ×2 IMPLANT
PACK CATARACT BRASINGTON (MISCELLANEOUS) ×2 IMPLANT
PACK EYE AFTER SURG (MISCELLANEOUS) ×2 IMPLANT
PACK OPTHALMIC (MISCELLANEOUS) ×2 IMPLANT
SOLUTION OPHTHALMIC SALT (MISCELLANEOUS) ×2 IMPLANT
SYR 3ML LL SCALE MARK (SYRINGE) ×4 IMPLANT
WATER STERILE IRR 250ML POUR (IV SOLUTION) ×2 IMPLANT
WIPE NON LINTING 3.25X3.25 (MISCELLANEOUS) ×2 IMPLANT

## 2020-03-12 NOTE — Op Note (Signed)
LOCATION:  [place]   PREOPERATIVE DIAGNOSIS:    Nuclear sclerotic cataract right eye. H25.11   POSTOPERATIVE DIAGNOSIS:  Nuclear sclerotic cataract right eye.     PROCEDURE:  Phacoemusification with posterior chamber intraocular lens placement of the right eye   ULTRASOUND TIME: Procedure(s): CATARACT EXTRACTION PHACO AND INTRAOCULAR LENS PLACEMENT (IOC) RIGHT 9.52 01:26.2 11.1% (Right)  LENS:   Implant Name Type Inv. Item Serial No. Manufacturer Lot No. LRB No. Used Action  LENS IOL DIOP 21.0 - [id number] Intraocular Lens LENS IOL DIOP 21.0 [id number] [id number]   Right 1 Implanted         SURGEON:  Wyonia Hough, MD   ANESTHESIA:  Topical with tetracaine drops and 2% Xylocaine jelly, augmented with 1% preservative-free intracameral lidocaine.    COMPLICATIONS:  None.   DESCRIPTION OF PROCEDURE:  The patient was identified in the holding room and transported to the operating room and placed in the supine position under the operating microscope.  The right eye was identified as the operative eye and it was prepped and draped in the usual sterile ophthalmic fashion.   A 1 millimeter clear-corneal paracentesis was made at the 12:00 position.  0.5 ml of preservative-free 1% lidocaine was injected into the anterior chamber. The anterior chamber was filled with Viscoat viscoelastic.  A 2.4 millimeter keratome was used to make a near-clear corneal incision at the 9:00 position.  A curvilinear capsulorrhexis was made with a cystotome and capsulorrhexis forceps.  Balanced salt solution was used to hydrodissect and hydrodelineate the nucleus.   Phacoemulsification was then used in stop and chop fashion to remove the lens nucleus and epinucleus.  The remaining cortex was then removed using the irrigation and aspiration handpiece. Provisc was then placed into the capsular bag to distend it for lens placement.  A lens was then injected into the capsular bag.  The remaining  viscoelastic was aspirated.   Wounds were hydrated with balanced salt solution.  The anterior chamber was inflated to a physiologic pressure with balanced salt solution.  No wound leaks were noted. Vigamox 0.2 ml of a 1mg  per ml solution was injected into the anterior chamber for a dose of 0.2 mg of intracameral antibiotic at the completion of the case.   Timolol and Brimonidine drops were applied to the eye.  The patient was taken to the recovery room in stable condition without complications of anesthesia or surgery.   Jenilyn Magana 03/12/2020, 8:58 AM

## 2020-03-12 NOTE — H&P (Signed)

## 2020-03-12 NOTE — Transfer of Care (Signed)
Immediate Anesthesia Transfer of Care Note  Patient: Kerri Hicks  Procedure(s) Performed: CATARACT EXTRACTION PHACO AND INTRAOCULAR LENS PLACEMENT (IOC) RIGHT 9.52 01:26.2 11.1% (Right Eye)  Patient Location: PACU  Anesthesia Type: MAC  Level of Consciousness: awake, alert  and patient cooperative  Airway and Oxygen Therapy: Patient Spontanous Breathing and Patient connected to supplemental oxygen  Post-op Assessment: Post-op Vital signs reviewed, Patient's Cardiovascular Status Stable, Respiratory Function Stable, Patent Airway and No signs of Nausea or vomiting  Post-op Vital Signs: Reviewed and stable  Complications: No complications documented.

## 2020-03-12 NOTE — Anesthesia Preprocedure Evaluation (Signed)
Anesthesia Evaluation  Patient identified by MRN, date of birth, ID band Patient awake    Reviewed: Allergy & Precautions, H&P , NPO status , Patient's Chart, lab work & pertinent test results, reviewed documented beta blocker date and time   Airway Mallampati: II  TM Distance: >3 FB Neck ROM: full    Dental no notable dental hx.    Pulmonary neg pulmonary ROS, former smoker,    Pulmonary exam normal breath sounds clear to auscultation       Cardiovascular Exercise Tolerance: Good hypertension, + CAD, + Past MI, + CABG and + Peripheral Vascular Disease (celiac artery stenosis )   Rhythm:regular Rate:Normal     Neuro/Psych negative neurological ROS  negative psych ROS   GI/Hepatic negative GI ROS, Neg liver ROS,   Endo/Other  diabetes, Type 2  Renal/GU negative Renal ROS  negative genitourinary   Musculoskeletal   Abdominal   Peds  Hematology negative hematology ROS (+)   Anesthesia Other Findings   Reproductive/Obstetrics negative OB ROS                             Anesthesia Physical Anesthesia Plan  ASA: III  Anesthesia Plan: MAC   Post-op Pain Management:    Induction:   PONV Risk Score and Plan: 2 and Treatment may vary due to age or medical condition  Airway Management Planned:   Additional Equipment:   Intra-op Plan:   Post-operative Plan:   Informed Consent: I have reviewed the patients History and Physical, chart, labs and discussed the procedure including the risks, benefits and alternatives for the proposed anesthesia with the patient or authorized representative who has indicated his/her understanding and acceptance.     Dental Advisory Given  Plan Discussed with: CRNA  Anesthesia Plan Comments:         Anesthesia Quick Evaluation

## 2020-03-12 NOTE — Anesthesia Postprocedure Evaluation (Signed)
Anesthesia Post Note  Patient: Kerri Hicks  Procedure(s) Performed: CATARACT EXTRACTION PHACO AND INTRAOCULAR LENS PLACEMENT (IOC) RIGHT 9.52 01:26.2 11.1% (Right Eye)     Patient location during evaluation: PACU Anesthesia Type: MAC Level of consciousness: awake and alert Pain management: pain level controlled Vital Signs Assessment: post-procedure vital signs reviewed and stable Respiratory status: spontaneous breathing, nonlabored ventilation, respiratory function stable and patient connected to nasal cannula oxygen Cardiovascular status: stable and blood pressure returned to baseline Postop Assessment: no apparent nausea or vomiting Anesthetic complications: no   No complications documented.  Alisa Graff

## 2020-04-03 ENCOUNTER — Other Ambulatory Visit: Payer: Self-pay | Admitting: Cardiovascular Disease

## 2020-04-28 ENCOUNTER — Other Ambulatory Visit: Payer: Self-pay | Admitting: Cardiovascular Disease

## 2020-05-23 ENCOUNTER — Other Ambulatory Visit: Payer: Self-pay | Admitting: Cardiovascular Disease

## 2020-05-23 NOTE — Telephone Encounter (Signed)
Rx request sent to pharmacy.  

## 2020-06-09 ENCOUNTER — Ambulatory Visit (INDEPENDENT_AMBULATORY_CARE_PROVIDER_SITE_OTHER): Payer: Medicare HMO

## 2020-06-09 ENCOUNTER — Other Ambulatory Visit: Payer: Self-pay

## 2020-06-09 ENCOUNTER — Other Ambulatory Visit: Payer: Self-pay | Admitting: Cardiovascular Disease

## 2020-06-09 DIAGNOSIS — R0989 Other specified symptoms and signs involving the circulatory and respiratory systems: Secondary | ICD-10-CM | POA: Diagnosis not present

## 2020-06-09 DIAGNOSIS — I6523 Occlusion and stenosis of bilateral carotid arteries: Secondary | ICD-10-CM

## 2020-06-12 ENCOUNTER — Ambulatory Visit: Payer: Medicare HMO | Admitting: Cardiovascular Disease

## 2020-06-12 ENCOUNTER — Other Ambulatory Visit: Payer: Self-pay

## 2020-06-12 ENCOUNTER — Encounter: Payer: Self-pay | Admitting: Cardiovascular Disease

## 2020-06-12 VITALS — BP 142/58 | HR 68 | Ht 60.0 in | Wt 202.0 lb

## 2020-06-12 DIAGNOSIS — I2510 Atherosclerotic heart disease of native coronary artery without angina pectoris: Secondary | ICD-10-CM | POA: Diagnosis not present

## 2020-06-12 DIAGNOSIS — E785 Hyperlipidemia, unspecified: Secondary | ICD-10-CM

## 2020-06-12 DIAGNOSIS — I10 Essential (primary) hypertension: Secondary | ICD-10-CM

## 2020-06-12 DIAGNOSIS — I739 Peripheral vascular disease, unspecified: Secondary | ICD-10-CM | POA: Diagnosis not present

## 2020-06-12 DIAGNOSIS — I779 Disorder of arteries and arterioles, unspecified: Secondary | ICD-10-CM | POA: Diagnosis not present

## 2020-06-12 NOTE — Progress Notes (Signed)
[id number]    Cardiology Office Note   Date:  06/12/2020   ID:  Brooklin, Rieger November 25, 1945, MRN [id number]  PCP:  Adin Hector, MD  Cardiologist:   Kathlyn Sacramento, MD   Chief Complaint  Patient presents with  . 6 month follow up     Patient c/o shortness of breath with over exertion. Medications reviewed by the patient verbally.       History of Present Illness: Kerri Hicks is a 75 y.o. female who presents for a follow-up visit regarding coronary artery disease status post CABG in June of 2017. She presented with anterior ST elevation myocardial infarction and was found to have significant two-vessel coronary artery disease including ostial LAD with normal ejection fraction. She underwent CABG 2 with LIMA to LAD and SVG to right PDA. She had postoperative atrial fibrillation. She is known to have moderate bilateral carotid disease, hyperlipidemia and previous tobacco use. She had chest pain and shortness of breath in July of last year.  She underwent a Lexiscan Myoview which showed no evidence of ischemia.  There was anterior wall defect suspicious for breast attenuation artifact.   She is known to have peripheral arterial disease with borderline stenosis affecting the distal aorta into bilateral common iliac arteries.  This is being treated medically given that her symptoms are not lifestyle limiting. She also has known history of refractory hypertension with no evidence of renal artery stenosis on previous renal artery duplex in 2020.  She has been doing well with no recent chest pain or worsening dyspnea.  No significant change in claudication.  She does not feel very limited by this.  Past Medical History:  Diagnosis Date  . Carotid arterial disease ([place])    a. 10/2016 Carotid U/S: RICA 8-92, LICA 11-94, bilat ECAs >50; b. 06/2019 U/S: bilat ICA 40-59%, bilat ECA >50%-->f/u 1 yr.  . Celiac artery stenosis ([place])    a. 10/2018 Abd duplex: 70-99% celiac artery stenosis.  .  Coronary artery disease    a. 09/2015 Anterior STEMI w/ ostial LAD disease-->CABG x 2 (LIMA to LAD and SVG to RPDA).  . Diabetes mellitus without complication ([place])   . History of echocardiogram    a. 09/2015 TEE (intraop): EF 50-55%.  Marland Kitchen Hyperlipidemia   . Hypertension    a. 10/2018 Renal artery duplex: 1-59% bilat RA stenosis.    Past Surgical History:  Procedure Laterality Date  . ANKLE SURGERY Left   . CARDIAC CATHETERIZATION N/A 09/30/2015   Procedure: Left Heart Cath and Coronary Angiography;  Surgeon: Leonie Man, MD;  Location: [facility];  Service: Cardiovascular;  Laterality: N/A;  . CATARACT EXTRACTION W/PHACO Left 02/20/2020   Procedure: CATARACT EXTRACTION PHACO AND INTRAOCULAR LENS PLACEMENT (IOC) LEFT 10.47 01:32.2 11.3%;  Surgeon: Leandrew Koyanagi, MD;  Location: [facility];  Service: Ophthalmology;  Laterality: Left;  . CATARACT EXTRACTION W/PHACO Right 03/12/2020   Procedure: CATARACT EXTRACTION PHACO AND INTRAOCULAR LENS PLACEMENT (IOC) RIGHT 9.52 01:26.2 11.1%;  Surgeon: Leandrew Koyanagi, MD;  Location: [facility];  Service: Ophthalmology;  Laterality: Right;  . CORONARY ARTERY BYPASS GRAFT N/A 09/30/2015   Procedure: CORONARY ARTERY BYPASS GRAFTING (CABG) TIMES TWO USING LEFT INTERNAL MAMMARY ARTERY AND ENDOSCOPICALLY HARVESTED RIGHT SAPHENOUS VEIN GRAFT;  Surgeon: Gaye Pollack, MD;  Location: [facility];  Service: Open Heart Surgery;  Laterality: N/A;     Current Outpatient Medications  Medication Sig Dispense Refill  . acetaminophen (TYLENOL) 325 MG tablet Take  2 tablets (650 mg total) by mouth every 6 (six) hours as needed for mild pain.    Marland Kitchen amLODipine (NORVASC) 10 MG tablet TAKE 1 TABLET EVERY DAY 90 tablet 0  . aspirin EC 81 MG tablet Take 81 mg by mouth daily.    . Calcium 600-200 MG-UNIT tablet Take 1 tablet by mouth 2 (two) times daily.     . carvedilol (COREG) 6.25 MG tablet TAKE 1 TABLET TWICE DAILY WITH MEALS 180 tablet 3  .  diphenhydrAMINE HCl (BENADRYL ALLERGY PO) Take by mouth as needed.    . docusate sodium (COLACE) 100 MG capsule Take 100 mg by mouth daily.    . hydrochlorothiazide (MICROZIDE) 12.5 MG capsule TAKE 1 CAPSULE EVERY DAY 90 capsule 0  . lisinopril (ZESTRIL) 40 MG tablet TAKE 1 TABLET EVERY DAY 90 tablet 1  . nitroGLYCERIN (NITROSTAT) 0.4 MG SL tablet Place 1 tablet (0.4 mg total) under the tongue every 5 (five) minutes as needed for chest pain. 90 tablet 3  . pantoprazole (PROTONIX) 40 MG tablet Take 1 tablet (40 mg total) by mouth daily. 30 tablet 0  . simvastatin (ZOCOR) 20 MG tablet TAKE 1 TABLET EVERY DAY AT 6PM 90 tablet 1   No current facility-administered medications for this visit.    Allergies:   Penicillins and Penicillin g    Social History:  The patient  reports that she quit smoking about 4 years ago. Her smoking use included cigarettes. She has a 50.00 pack-year smoking history. She has never used smokeless tobacco. She reports previous alcohol use. She reports that she does not use drugs.   Family History:  The patient's family history includes Heart attack in her mother; Heart disease in her brother and mother; Leukemia in her father.    ROS:  Please see the history of present illness.   Otherwise, review of systems are positive for none.   All other systems are reviewed and negative.    PHYSICAL EXAM: VS:  BP (!) 142/58 (BP Location: Left Arm, Patient Position: Sitting, Cuff Size: Large)   Pulse 68   Ht 5' (1.524 m)   Wt 202 lb (91.6 kg)   SpO2 98%   BMI 39.45 kg/m  , BMI Body mass index is 39.45 kg/m. GEN: Well nourished, well developed, in no acute distress  HEENT: normal  Neck: no JVD or masses. Bilateral carotid bruits louder on the right side Cardiac: RRR; no rubs, or gallops . 2/ 6 systolic ejection murmur in the aortic area.  No significant edema.  Respiratory:  clear to auscultation bilaterally, normal work of breathing GI: soft, nontender, nondistended, +  BS MS: no deformity or atrophy  Skin: warm and dry, no rash Neuro:  Strength and sensation are intact Psych: euthymic mood, full affect   EKG:  EKG is ordered today. The ekg ordered today demonstrates normal sinus rhythm with no significant ST or T wave changes.    Recent Labs: No results found for requested labs within last 8760 hours.    Lipid Panel    Component Value Date/Time   CHOL 147 12/02/2015 0849   TRIG 130 12/02/2015 0849   HDL 51 12/02/2015 0849   CHOLHDL 2.9 12/02/2015 0849   VLDL 26 12/02/2015 0849   LDLCALC 70 12/02/2015 0849      Wt Readings from Last 3 Encounters:  06/12/20 202 lb (91.6 kg)  03/12/20 194 lb (88 kg)  02/20/20 192 lb (87.1 kg)        ASSESSMENT  AND PLAN:  1.  Coronary artery disease involving native coronary arteries without angina: She is doing very well with no anginal symptoms.  Continue medical therapy.   2. Bilateral carotid disease: She had repeat carotid Doppler this year which showed stable moderate stenosis of the left side.  Repeat study in 1 year.  3. Hyperlipidemia: She is currently on simvastatin 20 mg once daily.  Most recent lipid profile in July showed an LDL 59 and triglyceride of 187.  4. Essential Hypertension: Blood pressure is reasonably controlled on current medications.  Previous renal artery duplex showed no renal artery stenosis.  5.  Peripheral arterial disease: The patient has evidence of distal aortic disease extending into the common iliac arteries bilaterally.  She has mild claudication.  Continue medical therapy.    Disposition:   FU with me in 12 months  Signed,  Kathlyn Sacramento, MD  06/12/2020 2:14 PM    [facility]

## 2020-06-12 NOTE — Patient Instructions (Signed)

## 2020-06-30 DIAGNOSIS — E7800 Pure hypercholesterolemia, unspecified: Secondary | ICD-10-CM | POA: Diagnosis not present

## 2020-06-30 DIAGNOSIS — E118 Type 2 diabetes mellitus with unspecified complications: Secondary | ICD-10-CM | POA: Diagnosis not present

## 2020-06-30 DIAGNOSIS — D649 Anemia, unspecified: Secondary | ICD-10-CM | POA: Diagnosis not present

## 2020-07-04 ENCOUNTER — Other Ambulatory Visit: Payer: Self-pay | Admitting: Cardiovascular Disease

## 2020-07-07 DIAGNOSIS — E1122 Type 2 diabetes mellitus with diabetic chronic kidney disease: Secondary | ICD-10-CM | POA: Diagnosis not present

## 2020-07-07 DIAGNOSIS — N1832 Chronic kidney disease, stage 3b: Secondary | ICD-10-CM | POA: Diagnosis not present

## 2020-07-07 DIAGNOSIS — I779 Disorder of arteries and arterioles, unspecified: Secondary | ICD-10-CM | POA: Diagnosis not present

## 2020-07-07 DIAGNOSIS — I480 Paroxysmal atrial fibrillation: Secondary | ICD-10-CM | POA: Diagnosis not present

## 2020-07-07 DIAGNOSIS — Z6839 Body mass index (BMI) 39.0-39.9, adult: Secondary | ICD-10-CM | POA: Diagnosis not present

## 2020-07-07 DIAGNOSIS — I129 Hypertensive chronic kidney disease with stage 1 through stage 4 chronic kidney disease, or unspecified chronic kidney disease: Secondary | ICD-10-CM | POA: Diagnosis not present

## 2020-07-07 DIAGNOSIS — I2510 Atherosclerotic heart disease of native coronary artery without angina pectoris: Secondary | ICD-10-CM | POA: Diagnosis not present

## 2020-07-07 DIAGNOSIS — I774 Celiac artery compression syndrome: Secondary | ICD-10-CM | POA: Diagnosis not present

## 2020-07-18 ENCOUNTER — Other Ambulatory Visit: Payer: Self-pay | Admitting: Cardiovascular Disease

## 2020-07-18 NOTE — Telephone Encounter (Signed)
Rx request sent to pharmacy.  

## 2020-07-22 ENCOUNTER — Other Ambulatory Visit: Payer: Self-pay | Admitting: Internal Medicine

## 2020-07-22 DIAGNOSIS — Z1231 Encounter for screening mammogram for malignant neoplasm of breast: Secondary | ICD-10-CM

## 2020-07-25 DIAGNOSIS — Z1211 Encounter for screening for malignant neoplasm of colon: Secondary | ICD-10-CM | POA: Diagnosis not present

## 2020-07-30 ENCOUNTER — Other Ambulatory Visit: Payer: Self-pay

## 2020-07-30 ENCOUNTER — Ambulatory Visit
Admission: RE | Admit: 2020-07-30 | Discharge: 2020-07-30 | Disposition: A | Discharge status: Home / Self Care | Payer: Medicare HMO | Source: Ambulatory Visit | Attending: Internal Medicine | Admitting: Internal Medicine

## 2020-07-30 DIAGNOSIS — Z1231 Encounter for screening mammogram for malignant neoplasm of breast: Secondary | ICD-10-CM

## 2020-07-31 LAB — COLOGUARD: COLOGUARD: NEGATIVE

## 2020-10-27 ENCOUNTER — Other Ambulatory Visit: Payer: Self-pay | Admitting: Cardiovascular Disease

## 2020-11-12 DIAGNOSIS — M069 Rheumatoid arthritis, unspecified: Secondary | ICD-10-CM | POA: Diagnosis not present

## 2020-11-12 DIAGNOSIS — H353132 Nonexudative age-related macular degeneration, bilateral, intermediate dry stage: Secondary | ICD-10-CM | POA: Diagnosis not present

## 2020-11-20 DIAGNOSIS — N1832 Chronic kidney disease, stage 3b: Secondary | ICD-10-CM | POA: Diagnosis not present

## 2020-11-20 DIAGNOSIS — E118 Type 2 diabetes mellitus with unspecified complications: Secondary | ICD-10-CM | POA: Diagnosis not present

## 2020-11-20 DIAGNOSIS — E7800 Pure hypercholesterolemia, unspecified: Secondary | ICD-10-CM | POA: Diagnosis not present

## 2020-11-27 DIAGNOSIS — D649 Anemia, unspecified: Secondary | ICD-10-CM | POA: Diagnosis not present

## 2020-11-27 DIAGNOSIS — R079 Chest pain, unspecified: Secondary | ICD-10-CM | POA: Diagnosis not present

## 2020-11-27 DIAGNOSIS — I129 Hypertensive chronic kidney disease with stage 1 through stage 4 chronic kidney disease, or unspecified chronic kidney disease: Secondary | ICD-10-CM | POA: Diagnosis not present

## 2020-11-27 DIAGNOSIS — I480 Paroxysmal atrial fibrillation: Secondary | ICD-10-CM | POA: Diagnosis not present

## 2020-11-27 DIAGNOSIS — R0609 Other forms of dyspnea: Secondary | ICD-10-CM | POA: Diagnosis not present

## 2020-11-27 DIAGNOSIS — E1122 Type 2 diabetes mellitus with diabetic chronic kidney disease: Secondary | ICD-10-CM | POA: Diagnosis not present

## 2020-11-27 DIAGNOSIS — M542 Cervicalgia: Secondary | ICD-10-CM | POA: Diagnosis not present

## 2020-11-27 DIAGNOSIS — Z951 Presence of aortocoronary bypass graft: Secondary | ICD-10-CM | POA: Diagnosis not present

## 2020-11-27 DIAGNOSIS — Z0000 Encounter for general adult medical examination without abnormal findings: Secondary | ICD-10-CM | POA: Diagnosis not present

## 2020-11-27 DIAGNOSIS — E7800 Pure hypercholesterolemia, unspecified: Secondary | ICD-10-CM | POA: Diagnosis not present

## 2020-11-27 DIAGNOSIS — R29898 Other symptoms and signs involving the musculoskeletal system: Secondary | ICD-10-CM | POA: Diagnosis not present

## 2020-11-27 DIAGNOSIS — R0602 Shortness of breath: Secondary | ICD-10-CM | POA: Diagnosis not present

## 2020-11-27 DIAGNOSIS — I2510 Atherosclerotic heart disease of native coronary artery without angina pectoris: Secondary | ICD-10-CM | POA: Diagnosis not present

## 2020-11-27 DIAGNOSIS — I6523 Occlusion and stenosis of bilateral carotid arteries: Secondary | ICD-10-CM | POA: Diagnosis not present

## 2020-12-08 ENCOUNTER — Other Ambulatory Visit: Payer: Self-pay | Admitting: Cardiovascular Disease

## 2020-12-11 DIAGNOSIS — I10 Essential (primary) hypertension: Secondary | ICD-10-CM | POA: Diagnosis not present

## 2020-12-16 ENCOUNTER — Telehealth: Payer: Self-pay | Admitting: Cardiovascular Disease

## 2020-12-16 NOTE — Telephone Encounter (Signed)
Please call to discuss Amlodipine. States she received a letter  from [facility] stating it had been denied.

## 2020-12-19 ENCOUNTER — Other Ambulatory Visit: Payer: Self-pay | Admitting: *Deleted

## 2020-12-19 NOTE — Telephone Encounter (Signed)
Just FYI this has been sitting in our inbasket for 3 days without being addressed.

## 2020-12-19 NOTE — Telephone Encounter (Signed)
Patient returning call.

## 2020-12-19 NOTE — Telephone Encounter (Signed)
I spoke to pt and she mentioned that she received a letter from [facility] concerning her amlodipine. Pt mentioned that she has plenty medication at this time and isn't sure why they sent her this request. Pt is aware that we haven't received any request at this time and if anything is needed from [place] the pharmacy will let us know or initiate the prior authorization once medication needs to be filled.

## 2020-12-19 NOTE — Telephone Encounter (Signed)
LMOVM to verify issue with amlodipine.

## 2021-01-07 ENCOUNTER — Other Ambulatory Visit: Payer: Self-pay | Admitting: Cardiovascular Disease

## 2021-02-09 ENCOUNTER — Other Ambulatory Visit: Payer: Self-pay | Admitting: Cardiovascular Disease

## 2021-02-25 DIAGNOSIS — E118 Type 2 diabetes mellitus with unspecified complications: Secondary | ICD-10-CM | POA: Diagnosis not present

## 2021-02-25 DIAGNOSIS — I10 Essential (primary) hypertension: Secondary | ICD-10-CM | POA: Diagnosis not present

## 2021-02-25 DIAGNOSIS — N1832 Chronic kidney disease, stage 3b: Secondary | ICD-10-CM | POA: Diagnosis not present

## 2021-03-04 DIAGNOSIS — D649 Anemia, unspecified: Secondary | ICD-10-CM | POA: Diagnosis not present

## 2021-03-04 DIAGNOSIS — I480 Paroxysmal atrial fibrillation: Secondary | ICD-10-CM | POA: Diagnosis not present

## 2021-03-04 DIAGNOSIS — E1122 Type 2 diabetes mellitus with diabetic chronic kidney disease: Secondary | ICD-10-CM | POA: Diagnosis not present

## 2021-03-04 DIAGNOSIS — Z951 Presence of aortocoronary bypass graft: Secondary | ICD-10-CM | POA: Diagnosis not present

## 2021-03-04 DIAGNOSIS — I129 Hypertensive chronic kidney disease with stage 1 through stage 4 chronic kidney disease, or unspecified chronic kidney disease: Secondary | ICD-10-CM | POA: Diagnosis not present

## 2021-03-04 DIAGNOSIS — I2510 Atherosclerotic heart disease of native coronary artery without angina pectoris: Secondary | ICD-10-CM | POA: Diagnosis not present

## 2021-03-04 DIAGNOSIS — I771 Stricture of artery: Secondary | ICD-10-CM | POA: Diagnosis not present

## 2021-03-04 DIAGNOSIS — E7800 Pure hypercholesterolemia, unspecified: Secondary | ICD-10-CM | POA: Diagnosis not present

## 2021-06-08 ENCOUNTER — Other Ambulatory Visit: Payer: Self-pay | Admitting: Cardiovascular Disease

## 2021-06-23 ENCOUNTER — Other Ambulatory Visit: Payer: Self-pay | Admitting: Internal Medicine

## 2021-06-23 DIAGNOSIS — Z1231 Encounter for screening mammogram for malignant neoplasm of breast: Secondary | ICD-10-CM

## 2021-08-04 ENCOUNTER — Ambulatory Visit: Payer: Medicare PPO | Admitting: Cardiovascular Disease

## 2021-08-19 ENCOUNTER — Other Ambulatory Visit: Payer: Self-pay | Admitting: Cardiovascular Disease

## 2021-08-25 ENCOUNTER — Ambulatory Visit: Payer: Medicare PPO | Admitting: Cardiovascular Disease

## 2021-08-27 ENCOUNTER — Other Ambulatory Visit: Payer: Self-pay

## 2021-08-27 ENCOUNTER — Observation Stay
Admission: EM | Admit: 2021-08-27 | Discharge: 2021-08-29 | Disposition: A | Discharge status: Home / Self Care | Payer: Medicare PPO | Attending: Internal Medicine | Admitting: Internal Medicine

## 2021-08-27 ENCOUNTER — Emergency Department: Payer: Medicare PPO

## 2021-08-27 ENCOUNTER — Encounter: Payer: Self-pay | Admitting: Radiology

## 2021-08-27 ENCOUNTER — Ambulatory Visit: Payer: Medicare PPO | Admitting: Cardiovascular Disease

## 2021-08-27 DIAGNOSIS — I700 Atherosclerosis of aorta: Secondary | ICD-10-CM | POA: Insufficient documentation

## 2021-08-27 DIAGNOSIS — K629 Disease of anus and rectum, unspecified: Secondary | ICD-10-CM | POA: Insufficient documentation

## 2021-08-27 DIAGNOSIS — R0989 Other specified symptoms and signs involving the circulatory and respiratory systems: Secondary | ICD-10-CM | POA: Diagnosis not present

## 2021-08-27 DIAGNOSIS — K529 Noninfective gastroenteritis and colitis, unspecified: Secondary | ICD-10-CM | POA: Diagnosis not present

## 2021-08-27 DIAGNOSIS — R1030 Lower abdominal pain, unspecified: Secondary | ICD-10-CM | POA: Diagnosis not present

## 2021-08-27 DIAGNOSIS — E669 Obesity, unspecified: Secondary | ICD-10-CM | POA: Insufficient documentation

## 2021-08-27 DIAGNOSIS — M7989 Other specified soft tissue disorders: Secondary | ICD-10-CM | POA: Diagnosis not present

## 2021-08-27 DIAGNOSIS — R5381 Other malaise: Secondary | ICD-10-CM | POA: Diagnosis not present

## 2021-08-27 DIAGNOSIS — Z7902 Long term (current) use of antithrombotics/antiplatelets: Secondary | ICD-10-CM | POA: Insufficient documentation

## 2021-08-27 DIAGNOSIS — N189 Chronic kidney disease, unspecified: Secondary | ICD-10-CM | POA: Insufficient documentation

## 2021-08-27 DIAGNOSIS — K51511 Left sided colitis with rectal bleeding: Secondary | ICD-10-CM | POA: Diagnosis not present

## 2021-08-27 DIAGNOSIS — I774 Celiac artery compression syndrome: Secondary | ICD-10-CM | POA: Insufficient documentation

## 2021-08-27 DIAGNOSIS — E785 Hyperlipidemia, unspecified: Secondary | ICD-10-CM | POA: Diagnosis not present

## 2021-08-27 DIAGNOSIS — D72829 Elevated white blood cell count, unspecified: Secondary | ICD-10-CM | POA: Insufficient documentation

## 2021-08-27 DIAGNOSIS — R112 Nausea with vomiting, unspecified: Secondary | ICD-10-CM | POA: Insufficient documentation

## 2021-08-27 DIAGNOSIS — Z951 Presence of aortocoronary bypass graft: Secondary | ICD-10-CM

## 2021-08-27 DIAGNOSIS — E1169 Type 2 diabetes mellitus with other specified complication: Secondary | ICD-10-CM | POA: Diagnosis not present

## 2021-08-27 DIAGNOSIS — I252 Old myocardial infarction: Secondary | ICD-10-CM | POA: Insufficient documentation

## 2021-08-27 DIAGNOSIS — I2510 Atherosclerotic heart disease of native coronary artery without angina pectoris: Secondary | ICD-10-CM | POA: Diagnosis not present

## 2021-08-27 DIAGNOSIS — R5383 Other fatigue: Secondary | ICD-10-CM | POA: Insufficient documentation

## 2021-08-27 DIAGNOSIS — E1159 Type 2 diabetes mellitus with other circulatory complications: Secondary | ICD-10-CM | POA: Insufficient documentation

## 2021-08-27 DIAGNOSIS — Z8249 Family history of ischemic heart disease and other diseases of the circulatory system: Secondary | ICD-10-CM | POA: Insufficient documentation

## 2021-08-27 DIAGNOSIS — I480 Paroxysmal atrial fibrillation: Secondary | ICD-10-CM | POA: Diagnosis not present

## 2021-08-27 DIAGNOSIS — R10814 Left lower quadrant abdominal tenderness: Secondary | ICD-10-CM | POA: Insufficient documentation

## 2021-08-27 DIAGNOSIS — I10 Essential (primary) hypertension: Secondary | ICD-10-CM | POA: Diagnosis present

## 2021-08-27 DIAGNOSIS — K625 Hemorrhage of anus and rectum: Secondary | ICD-10-CM | POA: Diagnosis not present

## 2021-08-27 DIAGNOSIS — Z79899 Other long term (current) drug therapy: Secondary | ICD-10-CM | POA: Diagnosis not present

## 2021-08-27 DIAGNOSIS — I129 Hypertensive chronic kidney disease with stage 1 through stage 4 chronic kidney disease, or unspecified chronic kidney disease: Secondary | ICD-10-CM | POA: Insufficient documentation

## 2021-08-27 DIAGNOSIS — E1122 Type 2 diabetes mellitus with diabetic chronic kidney disease: Secondary | ICD-10-CM | POA: Insufficient documentation

## 2021-08-27 DIAGNOSIS — Z6837 Body mass index (BMI) 37.0-37.9, adult: Secondary | ICD-10-CM | POA: Diagnosis not present

## 2021-08-27 DIAGNOSIS — Z87891 Personal history of nicotine dependence: Secondary | ICD-10-CM | POA: Insufficient documentation

## 2021-08-27 DIAGNOSIS — D6489 Other specified anemias: Secondary | ICD-10-CM | POA: Insufficient documentation

## 2021-08-27 DIAGNOSIS — Z7982 Long term (current) use of aspirin: Secondary | ICD-10-CM | POA: Insufficient documentation

## 2021-08-27 DIAGNOSIS — M5136 Other intervertebral disc degeneration, lumbar region: Secondary | ICD-10-CM | POA: Insufficient documentation

## 2021-08-27 DIAGNOSIS — E1151 Type 2 diabetes mellitus with diabetic peripheral angiopathy without gangrene: Secondary | ICD-10-CM | POA: Insufficient documentation

## 2021-08-27 DIAGNOSIS — K219 Gastro-esophageal reflux disease without esophagitis: Secondary | ICD-10-CM | POA: Insufficient documentation

## 2021-08-27 HISTORY — DX: Type 2 diabetes mellitus with unspecified complications: E11.8

## 2021-08-27 LAB — COMPREHENSIVE METABOLIC PANEL
ALT: 11 U/L (ref 0–44)
AST: 14 U/L — ABNORMAL LOW (ref 15–41)
Albumin: 3.9 g/dL (ref 3.5–5.0)
Alkaline Phosphatase: 78 U/L (ref 38–126)
Anion gap: 11 (ref 5–15)
BUN: 33 mg/dL — ABNORMAL HIGH (ref 8–23)
CO2: 22 mmol/L (ref 22–32)
Calcium: 9.1 mg/dL (ref 8.9–10.3)
Chloride: 103 mmol/L (ref 98–111)
Creatinine, Ser: 1.55 mg/dL — ABNORMAL HIGH (ref 0.44–1.00)
GFR, Estimated: 35 mL/min — ABNORMAL LOW (ref >60)
Glucose, Bld: 158 mg/dL — ABNORMAL HIGH (ref 70–99)
Potassium: 4.6 mmol/L (ref 3.5–5.1)
Sodium: 136 mmol/L (ref 135–145)
Total Bilirubin: 0.7 mg/dL (ref 0.3–1.2)
Total Protein: 8.2 g/dL — ABNORMAL HIGH (ref 6.5–8.1)

## 2021-08-27 LAB — CBC
HCT: 35.8 % — ABNORMAL LOW (ref 36.0–46.0)
Hemoglobin: 11.3 g/dL — ABNORMAL LOW (ref 12.0–15.0)
MCH: 27.5 pg (ref 26.0–34.0)
MCHC: 31.6 g/dL (ref 30.0–36.0)
MCV: 87.1 fL (ref 80.0–100.0)
Platelets: 306 10*3/uL (ref 150–400)
RBC: 4.11 MIL/uL (ref 3.87–5.11)
RDW: 15.2 % (ref 11.5–15.5)
WBC: 12.5 10*3/uL — ABNORMAL HIGH (ref 4.0–10.5)
nRBC: 0 % (ref 0.0–0.2)

## 2021-08-27 LAB — APTT: aPTT: 38 seconds — ABNORMAL HIGH (ref 24–36)

## 2021-08-27 LAB — URINALYSIS, ROUTINE W REFLEX MICROSCOPIC
Bilirubin Urine: NEGATIVE
Glucose, UA: NEGATIVE mg/dL
Ketones, ur: NEGATIVE mg/dL
Leukocytes,Ua: NEGATIVE
Nitrite: NEGATIVE
Protein, ur: 30 mg/dL — AB
Specific Gravity, Urine: 1.019 (ref 1.005–1.030)
pH: 5 (ref 5.0–8.0)

## 2021-08-27 LAB — LIPASE, BLOOD: Lipase: 28 U/L (ref 11–51)

## 2021-08-27 LAB — HEMOGLOBIN A1C
Hgb A1c MFr Bld: 7.1 % — ABNORMAL HIGH (ref 4.8–5.6)
Mean Plasma Glucose: 157.07 mg/dL

## 2021-08-27 LAB — PROTIME-INR
INR: 1.2 (ref 0.8–1.2)
Prothrombin Time: 14.9 seconds (ref 11.4–15.2)

## 2021-08-27 LAB — LACTIC ACID, PLASMA: Lactic Acid, Venous: 0.9 mmol/L (ref 0.5–1.9)

## 2021-08-27 MED ORDER — HEPARIN SODIUM (PORCINE) 5000 UNIT/ML IJ SOLN
5000.0000 [IU] | Freq: Two times a day (BID) | INTRAMUSCULAR | Status: DC
  Administered 2021-08-27 – 2021-08-29 (×4): 5000 [IU] via SUBCUTANEOUS
  Filled 2021-08-27 (×4): qty 1

## 2021-08-27 MED ORDER — NITROGLYCERIN 0.4 MG SL SUBL: 0.4000 mg | SUBLINGUAL_TABLET | SUBLINGUAL | Status: DC | PRN

## 2021-08-27 MED ORDER — INSULIN ASPART 100 UNIT/ML IJ SOLN
0.0000 [IU] | Freq: Three times a day (TID) | INTRAMUSCULAR | Status: DC
  Administered 2021-08-28: 5 [IU] via SUBCUTANEOUS
  Administered 2021-08-28: 2 [IU] via SUBCUTANEOUS
  Administered 2021-08-29: 3 [IU] via SUBCUTANEOUS
  Filled 2021-08-27 (×3): qty 1

## 2021-08-27 MED ORDER — IOHEXOL 300 MG/ML  SOLN
80.0000 mL | Freq: Once | INTRAMUSCULAR | Status: AC | PRN
  Administered 2021-08-27: 80 mL via INTRAVENOUS

## 2021-08-27 MED ORDER — HYDRALAZINE HCL 20 MG/ML IJ SOLN: 10.0000 mg | Freq: Four times a day (QID) | INTRAMUSCULAR | Status: DC | PRN

## 2021-08-27 MED ORDER — METRONIDAZOLE 500 MG/100ML IV SOLN
500.0000 mg | Freq: Once | INTRAVENOUS | Status: AC
  Administered 2021-08-27: 500 mg via INTRAVENOUS
  Filled 2021-08-27: qty 100

## 2021-08-27 MED ORDER — CARVEDILOL 6.25 MG PO TABS
6.2500 mg | ORAL_TABLET | Freq: Two times a day (BID) | ORAL | Status: DC
  Administered 2021-08-28 (×2): 6.25 mg via ORAL
  Filled 2021-08-27 (×2): qty 1

## 2021-08-27 MED ORDER — SODIUM CHLORIDE 0.9% FLUSH
3.0000 mL | Freq: Two times a day (BID) | INTRAVENOUS | Status: DC
  Administered 2021-08-28 – 2021-08-29 (×2): 3 mL via INTRAVENOUS

## 2021-08-27 MED ORDER — METRONIDAZOLE 500 MG/100ML IV SOLN
500.0000 mg | Freq: Two times a day (BID) | INTRAVENOUS | Status: DC
  Administered 2021-08-28 (×2): 500 mg via INTRAVENOUS
  Filled 2021-08-27 (×3): qty 100

## 2021-08-27 MED ORDER — ACETAMINOPHEN 650 MG RE SUPP: 650.0000 mg | Freq: Four times a day (QID) | RECTAL | Status: DC | PRN

## 2021-08-27 MED ORDER — AMLODIPINE BESYLATE 10 MG PO TABS
10.0000 mg | ORAL_TABLET | Freq: Every day | ORAL | Status: DC
  Administered 2021-08-28: 10 mg via ORAL
  Filled 2021-08-27: qty 2

## 2021-08-27 MED ORDER — PANTOPRAZOLE SODIUM 40 MG IV SOLR
40.0000 mg | Freq: Two times a day (BID) | INTRAVENOUS | Status: DC
  Administered 2021-08-27 – 2021-08-28 (×3): 40 mg via INTRAVENOUS
  Filled 2021-08-27 (×3): qty 10

## 2021-08-27 MED ORDER — SODIUM CHLORIDE 0.9 % IV BOLUS
1000.0000 mL | Freq: Once | INTRAVENOUS | Status: AC
  Administered 2021-08-27: 1000 mL via INTRAVENOUS

## 2021-08-27 MED ORDER — ACETAMINOPHEN 325 MG PO TABS
650.0000 mg | ORAL_TABLET | Freq: Four times a day (QID) | ORAL | Status: DC | PRN
  Administered 2021-08-28 – 2021-08-29 (×5): 650 mg via ORAL
  Filled 2021-08-27 (×3): qty 2

## 2021-08-27 MED ORDER — MORPHINE SULFATE (PF) 2 MG/ML IV SOLN: 2.0000 mg | INTRAVENOUS | Status: DC | PRN

## 2021-08-27 MED ORDER — CIPROFLOXACIN IN D5W 400 MG/200ML IV SOLN
400.0000 mg | Freq: Two times a day (BID) | INTRAVENOUS | Status: DC
Start: 2021-08-27 — End: 2021-08-29
  Administered 2021-08-27 – 2021-08-28 (×3): 400 mg via INTRAVENOUS
  Filled 2021-08-27 (×4): qty 200

## 2021-08-27 MED ORDER — LACTATED RINGERS IV SOLN: INTRAVENOUS | Status: AC

## 2021-08-27 NOTE — ED Triage Notes (Signed)
Pt here with abd pain and rectal bleeding that started yesterday. Pt states abd pain is lower and does not radiate. Pt endorsed N/V but no diarrhea. Pt states blood ranges from light to dark. ?

## 2021-08-27 NOTE — ED Notes (Signed)
Pt resting comfortably in bed at this time. Pt is alert and oriented with even and regular respirations. No acute distress noted. Pt denies any needs at this time. Call light within reach. Pt remains on purewick. ?

## 2021-08-27 NOTE — ED Notes (Signed)
Pt transferred onto hospital bed for comfort. Pt was able to stand and and pivot. Gait stable. Purwick in place. New warm blankets provided.  ?

## 2021-08-27 NOTE — ED Provider Triage Note (Signed)
Emergency Medicine Provider Triage Evaluation Note ? ?Roxanne Mins , a 76 y.o. female  was evaluated in triage.  Pt complains of abdominal discomfort, rectal bleeding. ? ?Review of Systems  ?Positive: As above ?Negative: No vomiting ? ?Physical Exam  ?There were no vitals taken for this visit. ?Gen:   Awake, no distress   ?Resp:  Normal effort  ?MSK:   Moves extremities without difficulty  ?Other:   ? ?Medical Decision Making  ?Medically screening exam initiated at 1:36 PM.  Appropriate orders placed.  [place] R Auzenne was informed that the remainder of the evaluation will be completed by another provider, this initial triage assessment does not replace that evaluation, and the importance of remaining in the ED until their evaluation is complete. ? ? ?  ?Lavonia Drafts, MD ?08/27/21 1336 ? ?

## 2021-08-27 NOTE — H&P (Signed)
?History and Physical  ? ? ?Patient: Kerri Hicks QQP:[id number] DOB: 07/14/1945 ?DOA: 08/27/2021 ?DOS: the patient was seen and examined on 08/28/2021 ?PCP: Adin Hector, MD  ?Patient coming from: Home ? ?Chief Complaint:  ?Chief Complaint  ?Patient presents with  ? Abdominal Pain  ? Rectal Bleeding  ? ?HPI: Kerri Hicks is a 76 y.o. female with medical history significant of CAD/PAD being followed by Dr.Arida coming to [place] with complaints of rectal bleeding and nausea vomiting abdominal pain.  Yesterday patient had couple of episodes of bright red blood in her stool and a small amount this morning but is when she started having nausea and vomiting and could not keep anything down she decided to come to the hospital.  Chart review showed that patient has history of celiac artery stenosis and canceled colonoscopy and anemia since 2017 discussed with patient patient states that she was aware that she had anemia but she thought it resolved, she canceled her colonoscopy because of COVID, and she was not aware that she has celiac artery stenosis.  Also discussed with her the carotid bruits patient states that she is followed by her heart doctor. ?  ?Review of Systems  ?Constitutional:  Positive for malaise/fatigue.  ?Gastrointestinal:  Positive for abdominal pain, blood in stool, nausea and vomiting.  ?All other systems reviewed and are negative. ? ? ?Past Medical History:  ?Diagnosis Date  ? Carotid arterial disease ([place])   ? a. 10/2016 Carotid U/S: RICA 7-12, LICA 45-80, bilat ECAs >50; b. 06/2019 U/S: bilat ICA 40-59%, bilat ECA >50%-->f/u 1 yr.  ? Celiac artery stenosis ([facility])   ? a. 10/2018 Abd duplex: 70-99% celiac artery stenosis.  ? Controlled type 2 diabetes mellitus with complication, without long-term current use of insulin ([place])   ? Coronary artery disease   ? a. 09/2015 Anterior STEMI w/ ostial LAD disease-->CABG x 2 (LIMA to LAD and SVG to RPDA).  ? Diabetes mellitus without complication ([place])   ?  History of echocardiogram   ? a. 09/2015 TEE (intraop): EF 50-55%.  ? Hyperlipidemia   ? Hypertension   ? a. 10/2018 Renal artery duplex: 1-59% bilat RA stenosis.  ? Tobacco abuse 09/30/2015  ? ?Past Surgical History:  ?Procedure Laterality Date  ? ANKLE SURGERY Left   ? CARDIAC CATHETERIZATION N/A 09/30/2015  ? Procedure: Left Heart Cath and Coronary Angiography;  Surgeon: Leonie Man, MD;  Location: [facility];  Service: Cardiovascular;  Laterality: N/A;  ? CATARACT EXTRACTION W/PHACO Left 02/20/2020  ? Procedure: CATARACT EXTRACTION PHACO AND INTRAOCULAR LENS PLACEMENT (IOC) LEFT 10.47 01:32.2 11.3%;  Surgeon: Leandrew Koyanagi, MD;  Location: [facility];  Service: Ophthalmology;  Laterality: Left;  ? CATARACT EXTRACTION W/PHACO Right 03/12/2020  ? Procedure: CATARACT EXTRACTION PHACO AND INTRAOCULAR LENS PLACEMENT (IOC) RIGHT 9.52 01:26.2 11.1%;  Surgeon: Leandrew Koyanagi, MD;  Location: [facility];  Service: Ophthalmology;  Laterality: Right;  ? CORONARY ARTERY BYPASS GRAFT N/A 09/30/2015  ? Procedure: CORONARY ARTERY BYPASS GRAFTING (CABG) TIMES TWO USING LEFT INTERNAL MAMMARY ARTERY AND ENDOSCOPICALLY HARVESTED RIGHT SAPHENOUS VEIN GRAFT;  Surgeon: Gaye Pollack, MD;  Location: [facility];  Service: Open Heart Surgery;  Laterality: N/A;  ? ?Social History:  reports that she quit smoking about 5 years ago. Her smoking use included cigarettes. She has a 50.00 pack-year smoking history. She has never used smokeless tobacco. She reports that she does not currently use alcohol. She reports that she does not use drugs. ? ?  Allergies  ?Allergen Reactions  ? Jardiance [Empagliflozin] Diarrhea and Nausea And Vomiting  ?  Severe GI symptoms  ? Penicillins Rash  ? Penicillin G Rash  ? ? ?Family History  ?Problem Relation Age of Onset  ? Heart attack Mother   ? Heart disease Mother   ? Leukemia Father   ? Heart disease Brother   ? Breast cancer Neg Hx   ? ? ?Prior to Admission medications    ?Medication Sig Start Date End Date Taking? Authorizing Provider  ?acetaminophen (TYLENOL) 325 MG tablet Take 2 tablets (650 mg total) by mouth every 6 (six) hours as needed for mild pain. 10/05/15   Barrett, Erin R, PA-C  ?amLODipine (NORVASC) 10 MG tablet TAKE 1 TABLET EVERY DAY 08/19/21   Wellington Hampshire, MD  ?aspirin EC 81 MG tablet Take 81 mg by mouth daily.    [provider]  ?Calcium 600-200 MG-UNIT tablet Take 1 tablet by mouth 2 (two) times daily.     [provider]  ?carvedilol (COREG) 6.25 MG tablet TAKE 1 TABLET TWICE DAILY WITH MEALS 06/08/21   Wellington Hampshire, MD  ?diphenhydrAMINE HCl (BENADRYL ALLERGY PO) Take by mouth as needed.    [provider]  ?docusate sodium (COLACE) 100 MG capsule Take 100 mg by mouth daily.    [provider]  ?hydrochlorothiazide (MICROZIDE) 12.5 MG capsule TAKE 1 CAPSULE EVERY DAY 02/09/21   Wellington Hampshire, MD  ?lisinopril (ZESTRIL) 40 MG tablet TAKE 1 TABLET EVERY DAY 06/08/21   Wellington Hampshire, MD  ?nitroGLYCERIN (NITROSTAT) 0.4 MG SL tablet Place 1 tablet (0.4 mg total) under the tongue every 5 (five) minutes as needed for chest pain. 11/02/19 02/14/20  Theora Gianotti, NP  ?pantoprazole (PROTONIX) 40 MG tablet Take 1 tablet (40 mg total) by mouth daily. 06/18/16   Wellington Hampshire, MD  ?simvastatin (ZOCOR) 20 MG tablet TAKE 1 TABLET EVERY DAY AT 6PM 06/08/21   Wellington Hampshire, MD  ? ? ?Physical Exam: ? ?Physical Exam ?Vitals and nursing note reviewed.  ?Constitutional:   ?   General: She is not in acute distress. ?   Appearance: Normal appearance. She is obese.  ?HENT:  ?   Head: Normocephalic and atraumatic.  ?   Right Ear: Hearing and external ear normal.  ?   Left Ear: Hearing and external ear normal.  ?   Nose: Nose normal. No nasal deformity.  ?   Mouth/Throat:  ?   Lips: Pink.  ?   Mouth: Mucous membranes are moist.  ?   Tongue: No lesions.  ?   Pharynx: Oropharynx is clear.  ?Eyes:  ?   Extraocular Movements:  Extraocular movements intact.  ?   Pupils: Pupils are equal, round, and reactive to light.  ?Neck:  ?   Thyroid: No thyroid mass.  ?   Vascular: Carotid bruit present.  ?Cardiovascular:  ?   Rate and Rhythm: Normal rate and regular rhythm.  ?   Pulses: Normal pulses.     ?     Dorsalis pedis pulses are 2+ on the right side and 2+ on the left side.  ?     Posterior tibial pulses are 2+ on the right side and 2+ on the left side.  ?   Heart sounds: Normal heart sounds.  ?Pulmonary:  ?   Effort: Pulmonary effort is normal.  ?   Breath sounds: Normal breath sounds.  ?Abdominal:  ?   General: Bowel  sounds are normal. There is no distension.  ?   Palpations: Abdomen is soft. There is no mass.  ?   Tenderness: There is no abdominal tenderness. There is no guarding.  ?   Hernia: No hernia is present.  ?Musculoskeletal:  ?   Right lower leg: No edema.  ?   Left lower leg: No edema.  ?Skin: ?   General: Skin is warm.  ?Neurological:  ?   General: No focal deficit present.  ?   Mental Status: She is alert and oriented to person, place, and time.  ?   Cranial Nerves: Cranial nerves 2-12 are intact.  ?   Motor: Motor function is intact.  ?Psychiatric:     ?   Attention and Perception: Attention normal.     ?   Mood and Affect: Mood normal.     ?   Speech: Speech normal.     ?   Behavior: Behavior normal. Behavior is cooperative.     ?   Cognition and Memory: Cognition normal.  ? ? ?Data Reviewed: ?Results for orders placed or performed during the hospital encounter of 08/27/21 (from the past 24 hour(s))  ?CBC     Status: Abnormal  ? Collection Time: 08/27/21  1:41 PM  ?Result Value Ref Range  ? WBC 12.5 (H) 4.0 - 10.5 K/uL  ? RBC 4.11 3.87 - 5.11 MIL/uL  ? Hemoglobin 11.3 (L) 12.0 - 15.0 g/dL  ? HCT 35.8 (L) 36.0 - 46.0 %  ? MCV 87.1 80.0 - 100.0 fL  ? MCH 27.5 26.0 - 34.0 pg  ? MCHC 31.6 30.0 - 36.0 g/dL  ? RDW 15.2 11.5 - 15.5 %  ? Platelets 306 150 - 400 K/uL  ? nRBC 0.0 0.0 - 0.2 %  ?Comprehensive metabolic panel     Status:  Abnormal  ? Collection Time: 08/27/21  1:41 PM  ?Result Value Ref Range  ? Sodium 136 135 - 145 mmol/L  ? Potassium 4.6 3.5 - 5.1 mmol/L  ? Chloride 103 98 - 111 mmol/L  ? CO2 22 22 - 32 mmol/L  ? Glucose, Bl

## 2021-08-27 NOTE — ED Provider Notes (Signed)
? ?[facility] ?Provider Note ? ? ? Event Date/Time  ? First MD Initiated Contact with Patient 08/27/21 1618   ?  (approximate) ? ?History  ? ?Chief Complaint: Abdominal Pain and Rectal Bleeding ? ?HPI ? ?Kerri Hicks is a 76 y.o. female with a past medical history of CAD, diabetes, hypertension, hyperlipidemia presents to the emergency department for rectal bleeding.  According to the patient yesterday she felt somewhat nauseated and has had a couple episodes of rectal bleeding since last night.  Patient states she has noted some bright red blood in her stool last night and a small amount this morning.  States her last bowel movement she did not see any blood.  Patient denies any prior GI bleed.  States mild lower abdominal discomfort/cramping.  No urinary symptoms. ? ?Physical Exam  ? ?Triage Vital Signs: ?ED Triage Vitals  ?Enc Vitals Group  ?   BP 08/27/21 1338 139/65  ?   Pulse Rate 08/27/21 1338 81  ?   Resp 08/27/21 1338 18  ?   Temp 08/27/21 1338 98.8 ?F (37.1 ?C)  ?   Temp Source 08/27/21 1338 Oral  ?   SpO2 08/27/21 1338 95 %  ?   Weight 08/27/21 1339 190 lb (86.2 kg)  ?   Height 08/27/21 1339 5' (1.524 m)  ?   Head Circumference --   ?   Peak Flow --   ?   Pain Score 08/27/21 1339 6  ?   Pain Loc --   ?   Pain Edu? --   ?   Excl. in [place]? --   ? ? ?Most recent vital signs: ?Vitals:  ? 08/27/21 1338  ?BP: 139/65  ?Pulse: 81  ?Resp: 18  ?Temp: 98.8 ?F (37.1 ?C)  ?SpO2: 95%  ? ? ?General: Awake, no distress.  ?CV:  Good peripheral perfusion.  Regular rate and rhythm  ?Resp:  Normal effort.  Equal breath sounds bilaterally.  ?Abd:  No distention.  Soft, nontender.  No rebound or guarding.  Benign abdominal exam ? ? ? ?ED Results / Procedures / Treatments  ? ?RADIOLOGY ? ?I personally viewed the CT images patient appears to have significant thickening of the left colon. ?Radiology is read the CT is severe left-sided colitis. ? ?MEDICATIONS ORDERED IN ED: ?Medications - No data to  display ? ? ?IMPRESSION / MDM / ASSESSMENT AND PLAN / ED COURSE  ?I reviewed the triage vital signs and the nursing notes. ? ?Patient presents to the emergency department for mild lower abdominal pain and rectal bleeding since last night.  Overall the patient appears well, no distress, benign abdominal exam.  Patient's lab work is reassuring.  Hemoglobin largely unchanged from February's labs in care everywhere.  Lipase negative, INR normal, chemistry shows no significant findings.  Mild renal insufficiency compared to baseline.  Given these findings we will proceed with CT imaging low-dose contrast we will IV hydrate.  Rectal examination shows light brown stool that is black positive.  No gross blood.  Differential would include GI bleed from diverticulosis, colitis, diverticulitis, clinically colon cancers.  Patient states she did do a Cologuard several years ago but has not done a colonoscopy. ? ?Patient CT scan has resulted showing severe colitis.  Patient's white blood cell count is elevated at 12,500.  Chemistry is mildly elevated as well.  Urinalysis is negative for infection.  With the patient's bloody stool with abdominal pain and significant findings on CT imaging we will admit  for IV antibiotics.  I have ordered IV Flagyl.  Patient agreeable with plan of care. ? ?FINAL CLINICAL IMPRESSION(S) / ED DIAGNOSES  ? ?Rectal bleeding ?Abdominal cramping ? ? ?Note:  This document was prepared using Dragon voice recognition software and may include unintentional dictation errors. ?  ?Harvest Dark, MD ?08/27/21 1727 ? ?

## 2021-08-28 ENCOUNTER — Encounter: Payer: Self-pay | Admitting: Internal Medicine

## 2021-08-28 DIAGNOSIS — E1169 Type 2 diabetes mellitus with other specified complication: Secondary | ICD-10-CM | POA: Diagnosis not present

## 2021-08-28 DIAGNOSIS — I10 Essential (primary) hypertension: Secondary | ICD-10-CM | POA: Diagnosis not present

## 2021-08-28 DIAGNOSIS — K625 Hemorrhage of anus and rectum: Secondary | ICD-10-CM

## 2021-08-28 DIAGNOSIS — E785 Hyperlipidemia, unspecified: Secondary | ICD-10-CM

## 2021-08-28 DIAGNOSIS — R0989 Other specified symptoms and signs involving the circulatory and respiratory systems: Secondary | ICD-10-CM

## 2021-08-28 DIAGNOSIS — K529 Noninfective gastroenteritis and colitis, unspecified: Secondary | ICD-10-CM | POA: Diagnosis not present

## 2021-08-28 LAB — COMPREHENSIVE METABOLIC PANEL
ALT: 8 U/L (ref 0–44)
AST: 12 U/L — ABNORMAL LOW (ref 15–41)
Albumin: 3.2 g/dL — ABNORMAL LOW (ref 3.5–5.0)
Alkaline Phosphatase: 69 U/L (ref 38–126)
Anion gap: 4 — ABNORMAL LOW (ref 5–15)
BUN: 20 mg/dL (ref 8–23)
CO2: 25 mmol/L (ref 22–32)
Calcium: 8.8 mg/dL — ABNORMAL LOW (ref 8.9–10.3)
Chloride: 109 mmol/L (ref 98–111)
Creatinine, Ser: 1.26 mg/dL — ABNORMAL HIGH (ref 0.44–1.00)
GFR, Estimated: 45 mL/min — ABNORMAL LOW (ref >60)
Glucose, Bld: 152 mg/dL — ABNORMAL HIGH (ref 70–99)
Potassium: 4.3 mmol/L (ref 3.5–5.1)
Sodium: 138 mmol/L (ref 135–145)
Total Bilirubin: 0.8 mg/dL (ref 0.3–1.2)
Total Protein: 6.9 g/dL (ref 6.5–8.1)

## 2021-08-28 LAB — CBG MONITORING, ED
Glucose-Capillary: 147 mg/dL — ABNORMAL HIGH (ref 70–99)
Glucose-Capillary: 150 mg/dL — ABNORMAL HIGH (ref 70–99)
Glucose-Capillary: 156 mg/dL — ABNORMAL HIGH (ref 70–99)
Glucose-Capillary: 246 mg/dL — ABNORMAL HIGH (ref 70–99)

## 2021-08-28 LAB — GASTROINTESTINAL PANEL BY PCR, STOOL (REPLACES STOOL CULTURE)

## 2021-08-28 LAB — CBC
HCT: 32.5 % — ABNORMAL LOW (ref 36.0–46.0)
Hemoglobin: 10.2 g/dL — ABNORMAL LOW (ref 12.0–15.0)
MCH: 28.2 pg (ref 26.0–34.0)
MCHC: 31.4 g/dL (ref 30.0–36.0)
MCV: 89.8 fL (ref 80.0–100.0)
Platelets: 254 10*3/uL (ref 150–400)
RBC: 3.62 MIL/uL — ABNORMAL LOW (ref 3.87–5.11)
RDW: 15.6 % — ABNORMAL HIGH (ref 11.5–15.5)
WBC: 12.8 10*3/uL — ABNORMAL HIGH (ref 4.0–10.5)
nRBC: 0 % (ref 0.0–0.2)

## 2021-08-28 LAB — SEDIMENTATION RATE: Sed Rate: 63 mm/hr — ABNORMAL HIGH (ref 0–30)

## 2021-08-28 LAB — GLUCOSE, CAPILLARY
Glucose-Capillary: 119 mg/dL — ABNORMAL HIGH (ref 70–99)
Glucose-Capillary: 185 mg/dL — ABNORMAL HIGH (ref 70–99)

## 2021-08-28 LAB — C-REACTIVE PROTEIN: CRP: 22.4 mg/dL — ABNORMAL HIGH (ref <1.0)

## 2021-08-28 NOTE — Assessment & Plan Note (Addendum)
Continue coreg and hold amlodipine with blood pressure being a little on the lower side this morning.  The patient has a blood pressure cuff at home and will follow-up with PMD next week. ? ? ?

## 2021-08-28 NOTE — Assessment & Plan Note (Addendum)
Hemoglobin A1c 7.1.  Continue Zocor upon discharge.  At this point can be diet controlled.  Last for sugars 119, 185, 136 and 157.  We will leave it up to PMD on whether to start oral medications as outpatient. ? ?

## 2021-08-28 NOTE — Consult Note (Addendum)
? ? ?GI Inpatient Consult Note ? ?Reason for Consult: GI bleed with anemia ?  ?Attending Requesting Consult: Dr. Florina Ou ? ?History of Present Illness: ?Kerri Hicks is a 76 y.o. female seen for evaluation of GI bleed with anemia at the request of Dr. Posey Pronto. Patient has a PMH of CAD, CABG, PAD, paroxysmal atrial fib,  diabetes mellitus, hypertension, hyperlipidemia, CKD, chronic anemia, GERD, celiac artery stenosis.  Patient presented to the [facility] ED for chief complaint of lower abdominal pain with rectal bleeding associated with vomiting and no diarrhea. Upon presentation to the ED, vital signs were 98.8, 81, 18, 139/75, SPO2 95%. Labs were significant for leukocytosis, stable hemoglobin 11.3,  BUN 33, creatinine 1.55. Imaging studies revealed CT of the abdomen pelvis with severe left-sided colitis.  ? ?The patient reports her baseline bowel habits are formed stool every 1 to 3 days.  She did have an episode of bright red rectal bleeding in 2020 associated with lower abdominal pain, leukocytosis, and was treated with Cipro/Flagyl for empiric diverticulitis.  She was set up for colonoscopy in 2020 and canceled it due to [place] and resolution of her symptoms. ? ?Since 2020, she has had no further rectal bleeding or abdominal pain until Wednesday night.  Patient did have 2-3 solid formed stools Wednesday morning without rectal pain, bleeding, or hemorrhoidal symptoms. A few hours after dinner, she developed acute severe bilateral lower cramps and felt the need to have a bowel movement.  She sat on the toilet and cannot even remember if she passed stool , but she had immediate vomiting.  She vomited the food she had eaten.  She returned back to bed and felt better, but still had cramps. No nausea or any further vomiting. She  got up to void about 30 minutes later and passed a tablespoon or so of blood with mucus in the bottom of the commode.  She reports she was up and down all night with water was never red.    She did have crampy lower abdominal pain all night long and presented to the emergency room on Thurs. She denies any fevers or chills. No recent antibiotics.  No ill contacts. She denies any upper GI complaints and has had no heartburn, reflux, nausea, and only that one episode of vomiting.  ? ?Since she has been in the ED receiving Cipro 500 mg IV every 12 hours, Flagyl 500 mg IV every 12 hours, Protonix 40 mg IV every 12 hours, and MIV fluids, she is feeling much better and she has had no further bowel movement since Wednesday morning.  She has had no further rectal bleeding.  Her diffuse lower abdominal crampy pain has steadily improved and she reports one isolated area of tenderness in the left lower quadrant.  She has had no solid food since Wednesday night and she is hungry.   ? ?She has a baseline history of chronic mild normocytic anemia with hemoglobin range 10-11.  She had a ferritin of 22 06/30/2020 without oral iron supplementation.  Reticulocytes 2.61.  CKD with baseline BUN is 21, creatinine 1.2 range. ? ?Cologuard: 07/25/2020 negative ?Last Colonoscopy: none ?Last Endoscopy: none ? ?Family history: Sister deceased with brain cancer, brother deceased with lung cancer, father deceased with leukemia ? ?Past Medical History:  ?Past Medical History:  ?Diagnosis Date  ? Carotid arterial disease ([place])   ? a. 10/2016 Carotid U/S: RICA 4-49, LICA 67-59, bilat ECAs >50; b. 06/2019 U/S: bilat ICA 40-59%, bilat ECA >50%-->f/u 1 yr.  ?  Celiac artery stenosis ([facility])   ? a. 10/2018 Abd duplex: 70-99% celiac artery stenosis.  ? Controlled type 2 diabetes mellitus with complication, without long-term current use of insulin ([place])   ? Coronary artery disease   ? a. 09/2015 Anterior STEMI w/ ostial LAD disease-->CABG x 2 (LIMA to LAD and SVG to RPDA).  ? Diabetes mellitus without complication ([place])   ? History of echocardiogram   ? a. 09/2015 TEE (intraop): EF 50-55%.  ? Hyperlipidemia   ? Hypertension   ? a. 10/2018 Renal artery  duplex: 1-59% bilat RA stenosis.  ? Tobacco abuse 09/30/2015  ?  ?Problem List: ?Patient Active Problem List  ? Diagnosis Date Noted  ? Celiac artery stenosis ([place]) 08/27/2021  ? Rectal bleeding 08/27/2021  ? PAF (paroxysmal atrial fibrillation) ([place]) 10/15/2015  ? Bilateral carotid bruits 10/15/2015  ? T2DM (type 2 diabetes mellitus) ([place]) 09/30/2015  ? ST elevation (STEMI) myocardial infarction involving left anterior descending coronary artery ([place]) 09/30/2015  ? S/P CABG x 2 09/30/2015  ? Essential hypertension   ?  ?Past Surgical History: ?Past Surgical History:  ?Procedure Laterality Date  ? ANKLE SURGERY Left   ? CARDIAC CATHETERIZATION N/A 09/30/2015  ? Procedure: Left Heart Cath and Coronary Angiography;  Surgeon: Leonie Man, MD;  Location: [place] [facility];  Service: Cardiovascular;  Laterality: N/A;  ? CATARACT EXTRACTION W/PHACO Left 02/20/2020  ? Procedure: CATARACT EXTRACTION PHACO AND INTRAOCULAR LENS PLACEMENT (IOC) LEFT 10.47 01:32.2 11.3%;  Surgeon: Leandrew Koyanagi, MD;  Location: [place];  Service: Ophthalmology;  Laterality: Left;  ? CATARACT EXTRACTION W/PHACO Right 03/12/2020  ? Procedure: CATARACT EXTRACTION PHACO AND INTRAOCULAR LENS PLACEMENT (IOC) RIGHT 9.52 01:26.2 11.1%;  Surgeon: Leandrew Koyanagi, MD;  Location: [place];  Service: Ophthalmology;  Laterality: Right;  ? CORONARY ARTERY BYPASS GRAFT N/A 09/30/2015  ? Procedure: CORONARY ARTERY BYPASS GRAFTING (CABG) TIMES TWO USING LEFT INTERNAL MAMMARY ARTERY AND ENDOSCOPICALLY HARVESTED RIGHT SAPHENOUS VEIN GRAFT;  Surgeon: Gaye Pollack, MD;  Location: [place];  Service: Open Heart Surgery;  Laterality: N/A;  ?  ?Allergies: ?Allergies  ?Allergen Reactions  ? Jardiance [Empagliflozin] Diarrhea and Nausea And Vomiting  ?  Severe GI symptoms  ? Penicillins Rash  ? Penicillin G Rash  ?  ?Home Medications: ?(Not in a hospital admission) ? ?Home medication reconciliation was completed with the patient.   ? ?Scheduled Inpatient Medications: ?  ? amLODipine  10 mg Oral Daily  ? carvedilol  6.25 mg Oral BID WC  ? heparin  5,000 Units Subcutaneous Q12H  ? insulin aspart  0-15 Units Subcutaneous TID WC  ? pantoprazole (PROTONIX) IV  40 mg Intravenous Q12H  ? sodium chloride flush  3 mL Intravenous Q12H  ? ? ?Continuous Inpatient Infusions: ?  ? ciprofloxacin Stopped (08/28/21 0926)  ? lactated ringers 50 mL/hr at 08/28/21 0610  ? metronidazole    ? ? ?PRN Inpatient Medications:  ?acetaminophen **OR** acetaminophen, hydrALAZINE, morphine injection, nitroGLYCERIN ? ?Family History: ?family history includes Heart attack in her mother; Heart disease in her brother and mother; Leukemia in her father.  The patient's family history is negative for inflammatory bowel disorders, GI malignancy, or solid organ transplantation. ? ?Social History:  ? reports that she quit smoking about 5 years ago. Her smoking use included cigarettes. She has a 50.00 pack-year smoking history. She has never used smokeless tobacco. She reports that she does not currently use alcohol. She reports that she does not use drugs. The patient  denies ETOH, tobacco, or drug use.  ? ?Review of Systems: ?Constitutional: Weight is stable.  ?Eyes: No changes in vision. ?ENT: No oral lesions, sore throat.  ?GI: see HPI.  ?Heme/Lymph: No easy bruising.  ?CV: No chest pain.  ?GU: No hematuria.  ?Integumentary: No rashes.  ?Neuro: No headaches.  ?Psych: No depression/anxiety.  ?Endocrine: No heat/cold intolerance.  ?Allergic/Immunologic: No urticaria.  ?Resp: No cough, SOB.  ?Musculoskeletal: No joint swelling.  ?  ?Physical Examination: ?BP (!) 132/50 (BP Location: Left Arm)   Pulse 85   Temp 98.8 ?F (37.1 ?C) (Oral)   Resp 16   Ht 5' (1.524 m)   Wt 86.2 kg   SpO2 94%   BMI 37.11 kg/m?  ?Gen: NAD, alert and oriented x 4, Watching TV asking for food ?HEENT: EOMI, ?Neck: supple, no JVD or thyromegaly ?Chest: CTA bilaterally, no wheezes, crackles, or other  adventitious sounds ?CV: RRR, no m/g/c/r ?Abd: soft, isolated area of mild tenderness LLQ abdomen, otherwise non tender, soft, non distended. BS in all four quadrants; no HSM, guarding, rigidity, or rebound tenderness

## 2021-08-28 NOTE — ED Notes (Signed)
Pt's purwick was malpositioned causing urine to leak on bed. Pericare provided, full bed sheets changed, new purwick in place. ? ?At time of changing pt, noted small amount of bright red blood. No blood clots. Pt denies abdominal pain.  ?

## 2021-08-28 NOTE — Assessment & Plan Note (Deleted)
CT angio of the abdomen and pelvis for SMA or IMA stenosis. ?Vascular consult as deemed appropriate. ?Pt has bl carotid bruits and also has heart disease. ?

## 2021-08-28 NOTE — ED Notes (Signed)
PRN tylenol given for neck pain. Pt states pain has been ongoing for "a while now." Denies any acute injury. Pain rating 6 out of 10. Comfort measures: pt repositioned and provided with an additional pillow.  ?

## 2021-08-28 NOTE — ED Notes (Signed)
Shawn , EDT called to bring pt to the floor ?

## 2021-08-28 NOTE — ED Notes (Signed)
Informed RN bed assigned 

## 2021-08-28 NOTE — ED Notes (Signed)
Report received by previous RN. Pt alert and oriented x4. Pt was able to stand and pivot for transfer onto hospital bed. Pt gait steady, denied dizziness. Continues to deny NVD. Abdominal Pain tolerable. No bloody bowels noted. Pt connected to monitor system. Call bell within reach.  ?

## 2021-08-28 NOTE — Assessment & Plan Note (Addendum)
Stool comprehensive panel negative.  I did not order C. difficile testing because of bleeding.  Empiric Cipro and Flagyl was given during the hospital course and will be prescribed upon going home. ?

## 2021-08-28 NOTE — Assessment & Plan Note (Deleted)
cotn asa and heparin.  ?

## 2021-08-28 NOTE — Assessment & Plan Note (Addendum)
Hold aspirin with rectal bleeding.  Can go back on Zocor. ?

## 2021-08-28 NOTE — Assessment & Plan Note (Addendum)
Patient was seen by GI and elected not to do a flexible sigmoidoscopy today and wanted to do an outpatient colonoscopy.  Hemoglobin 11.3 on presentation down to 10.0 upon discharge.  Advised to follow-up with gastroenterology as outpatient to set up colonoscopy. ?

## 2021-08-28 NOTE — ED Notes (Signed)
Pt called out due to her pure wick not working properly and she had soiled her bed. Pt liens cleaned, bed wiped down, and new gown placed on patient. New pure wick with a brief placed on patient. Pt is sitting up on the side of the bed at this time ?

## 2021-08-28 NOTE — Assessment & Plan Note (Addendum)
Follow up as outpatient.  

## 2021-08-28 NOTE — Progress Notes (Signed)
?  Progress Note ? ? ?Patient: Kerri Hicks NGE:[id number] DOB: [birth year] DOA: 08/27/2021     1 ?DOS: the patient was seen and examined on 08/28/2021 ?  ? ? ?Assessment and Plan: ?* Colitis ?I ordered a stool comprehensive panel.  I did not order C. difficile testing because of bleeding.  Admitting physician empirically placed on antibiotics but not sure if I need to treat.  GI advance to clear liquid diet and will advance to full liquid diet this evening. ? ?Rectal bleeding ?Patient was seen by GI and elected not to do a flexible sigmoidoscopy today and wanted to do an outpatient colonoscopy.  Watch hemoglobin today and reassess tomorrow. ? ?Type 2 diabetes mellitus with hyperlipidemia ([place]) ?Hemoglobin A1c 7.1.  Does not look like she is on any agents at home.  Continue to monitor on sliding scale insulin for right now.  We will decide on oral medications upon discharge.  Holding Zocor for now. ? ? ?Essential hypertension ?Continue coreg and amlodipine. ? ? ? ?S/P CABG x 2 ?Hold aspirin with rectal bleeding.  Holding Zocor for right now ? ?Bilateral carotid bruits ?Follow-up as outpatient. ? ? ? ? ?  ? ?Subjective: Patient came in with some lower abdominal pain.  Not been eating very well for the past few days.  Having some intermittent bleeding tablespoon at a time. ? ?Physical Exam: ?Vitals:  ? 08/28/21 0820 08/28/21 1140 08/28/21 1155 08/28/21 1244  ?BP: (!) 144/62 118/60  129/85  ?Pulse: 83 78  79  ?Resp: '18 16  18  '$ ?Temp:   98.1 ?F (36.7 ?C) 97.8 ?F (36.6 ?C)  ?TempSrc:   Oral   ?SpO2: 95% 97%  94%  ?Weight:      ?Height:      ? ?Physical Exam ?HENT:  ?   Head: Normocephalic.  ?   Mouth/Throat:  ?   Pharynx: No oropharyngeal exudate.  ?Eyes:  ?   General: Lids are normal.  ?   Conjunctiva/sclera: Conjunctivae normal.  ?Cardiovascular:  ?   Rate and Rhythm: Normal rate and regular rhythm.  ?   Heart sounds: Normal heart sounds, S1 normal and S2 normal.  ?Pulmonary:  ?   Breath sounds: Normal breath sounds. No  decreased breath sounds, wheezing, rhonchi or rales.  ?Abdominal:  ?   Palpations: Abdomen is soft.  ?   Tenderness: There is abdominal tenderness in the left lower quadrant.  ?Musculoskeletal:  ?   Right lower leg: Swelling present.  ?   Left lower leg: Swelling present.  ?Skin: ?   General: Skin is warm.  ?   Findings: No rash.  ?Neurological:  ?   Mental Status: She is alert and oriented to person, place, and time.  ?  ?Data Reviewed: ?Creatinine 1.26, white blood cell count 12.8, last hemoglobin 10.2 ? ?Family Communication:  ? ?Disposition: ?Status is: Inpatient ?Remains inpatient appropriate because: Patient with left-sided colitis seen on CT scan ?Planned Discharge Destination: Home ? ? ?Author: ?Loletha Grayer, MD ?08/28/2021 3:49 PM ? ?For on call review [email or web address].  ?

## 2021-08-29 DIAGNOSIS — E1169 Type 2 diabetes mellitus with other specified complication: Secondary | ICD-10-CM | POA: Diagnosis not present

## 2021-08-29 DIAGNOSIS — K625 Hemorrhage of anus and rectum: Secondary | ICD-10-CM | POA: Diagnosis not present

## 2021-08-29 DIAGNOSIS — K529 Noninfective gastroenteritis and colitis, unspecified: Secondary | ICD-10-CM | POA: Diagnosis not present

## 2021-08-29 DIAGNOSIS — I10 Essential (primary) hypertension: Secondary | ICD-10-CM | POA: Diagnosis not present

## 2021-08-29 DIAGNOSIS — Z951 Presence of aortocoronary bypass graft: Secondary | ICD-10-CM

## 2021-08-29 LAB — CBC
HCT: 31.9 % — ABNORMAL LOW (ref 36.0–46.0)
Hemoglobin: 10 g/dL — ABNORMAL LOW (ref 12.0–15.0)
MCH: 28.1 pg (ref 26.0–34.0)
MCHC: 31.3 g/dL (ref 30.0–36.0)
MCV: 89.6 fL (ref 80.0–100.0)
Platelets: 270 10*3/uL (ref 150–400)
RBC: 3.56 MIL/uL — ABNORMAL LOW (ref 3.87–5.11)
RDW: 15.6 % — ABNORMAL HIGH (ref 11.5–15.5)
WBC: 13.8 10*3/uL — ABNORMAL HIGH (ref 4.0–10.5)
nRBC: 0 % (ref 0.0–0.2)

## 2021-08-29 LAB — BASIC METABOLIC PANEL
Anion gap: 6 (ref 5–15)
BUN: 17 mg/dL (ref 8–23)
CO2: 25 mmol/L (ref 22–32)
Calcium: 8.8 mg/dL — ABNORMAL LOW (ref 8.9–10.3)
Chloride: 108 mmol/L (ref 98–111)
Creatinine, Ser: 1.48 mg/dL — ABNORMAL HIGH (ref 0.44–1.00)
GFR, Estimated: 37 mL/min — ABNORMAL LOW (ref >60)
Glucose, Bld: 125 mg/dL — ABNORMAL HIGH (ref 70–99)
Potassium: 4.6 mmol/L (ref 3.5–5.1)
Sodium: 139 mmol/L (ref 135–145)

## 2021-08-29 LAB — GLUCOSE, CAPILLARY
Glucose-Capillary: 136 mg/dL — ABNORMAL HIGH (ref 70–99)
Glucose-Capillary: 157 mg/dL — ABNORMAL HIGH (ref 70–99)

## 2021-08-29 LAB — TYPE AND SCREEN
ABO/RH(D): A POS
Antibody Screen: NEGATIVE

## 2021-08-29 MED ORDER — METRONIDAZOLE 500 MG PO TABS
500.0000 mg | ORAL_TABLET | Freq: Two times a day (BID) | ORAL | Status: DC
  Administered 2021-08-29: 500 mg via ORAL
  Filled 2021-08-29: qty 1

## 2021-08-29 MED ORDER — CIPROFLOXACIN HCL 250 MG PO TABS: 250.0000 mg | ORAL_TABLET | Freq: Two times a day (BID) | ORAL | 0 refills | Status: AC

## 2021-08-29 MED ORDER — METRONIDAZOLE 500 MG PO TABS: 500.0000 mg | ORAL_TABLET | Freq: Two times a day (BID) | ORAL | 0 refills | Status: AC

## 2021-08-29 MED ORDER — CARVEDILOL 3.125 MG PO TABS: 3.1250 mg | ORAL_TABLET | Freq: Two times a day (BID) | ORAL | Status: DC

## 2021-08-29 MED ORDER — CARVEDILOL 6.25 MG PO TABS: 6.2500 mg | ORAL_TABLET | Freq: Two times a day (BID) | ORAL | Status: DC

## 2021-08-29 MED ORDER — PANTOPRAZOLE SODIUM 40 MG PO TBEC
40.0000 mg | DELAYED_RELEASE_TABLET | Freq: Every day | ORAL | Status: DC
Start: 2021-08-29 — End: 2021-08-29
  Administered 2021-08-29: 40 mg via ORAL
  Filled 2021-08-29: qty 1

## 2021-08-29 MED ORDER — CIPROFLOXACIN HCL 500 MG PO TABS
250.0000 mg | ORAL_TABLET | Freq: Two times a day (BID) | ORAL | Status: DC
  Administered 2021-08-29: 250 mg via ORAL
  Filled 2021-08-29 (×2): qty 0.5

## 2021-08-29 NOTE — Plan of Care (Signed)
  Problem: Clinical Measurements: Goal: Ability to maintain clinical measurements within normal limits will improve Outcome: Progressing   Problem: Clinical Measurements: Goal: Will remain free from infection Outcome: Progressing   

## 2021-08-29 NOTE — Care Management Obs Status (Signed)
MEDICARE OBSERVATION STATUS NOTIFICATION ? ? ?Patient Details  ?Name: Kerri Hicks ?MRN: [id number] ?Date of Birth: February 11, 1946 ? ? ?Medicare Observation Status Notification Given:  Yes ? ? ? ?Alberteen Sam, LCSW ?08/29/2021, 8:35 AM ?

## 2021-08-29 NOTE — Care Management CC44 (Signed)
Condition Code 44 Documentation Completed ? ?Patient Details  ?Name: Kerri Hicks ?MRN: [id number] ?Date of Birth: [birth year] ? ? ?Condition Code 44 given:  Yes ?Patient signature on Condition Code 44 notice:  Yes ?Documentation of 2 MD's agreement:  Yes ?Code 44 added to claim:  Yes ? ? ? ?Alberteen Sam, LCSW ?08/29/2021, 8:35 AM ? ?

## 2021-08-29 NOTE — Progress Notes (Signed)
Patient to dc home today, spouse to pick up around [facility] from [facility] main [facility], will call unit when he's close. Treatment team updated.  ? ?No discharge needs identified.  ? ?TOC signing off.  ? Pricilla Riffle, [place] ?[phone] ? ?

## 2021-08-29 NOTE — Discharge Summary (Signed)
?Physician Discharge Summary ?  ?Patient: Kerri Hicks MRN: [id number] DOB: [birth year]  ?Admit date:     08/27/2021  ?Discharge date: 08/29/21  ?Discharge Physician: Loletha Grayer  ? ?PCP: Adin Hector, MD  ? ?Recommendations at discharge:  ? ?Follow-up PCP 5 days ?Follow-up gastroenterology in a few weeks ? ?Discharge Diagnoses: ?Principal Problem: ?  Colitis ?Active Problems: ?  Rectal bleeding ?  Type 2 diabetes mellitus with hyperlipidemia ([place]) ?  Essential hypertension ?  S/P CABG x 2 ?  Bilateral carotid bruits ? ? ?Hospital Course: ?The patient came into the hospital on 08/27/2021 and discharged on 08/29/2021.  Came in with lower abdominal pain and rectal bleeding.  CT scan was done and showed a severe left-sided colitis which could be infectious or inflammatory.  The patient was empirically started on antibiotics.  Stool comprehensive panel was negative.  The patient was seen by gastroenterology and she declined flexible sigmoidoscopy wanting to do a colonoscopy as outpatient.  Her abdominal pain was better on the time of discharge.  The patient's rectal bleeding was a few tablespoons at a time.  The patient stated the bleeding had stopped prior to going home. ? ?Assessment and Plan: ?* Colitis ?Stool comprehensive panel negative.  I did not order C. difficile testing because of bleeding.  Empiric Cipro and Flagyl was given during the hospital course and will be prescribed upon going home. ? ?Rectal bleeding ?Patient was seen by GI and elected not to do a flexible sigmoidoscopy today and wanted to do an outpatient colonoscopy.  Hemoglobin 11.3 on presentation down to 10.0 upon discharge.  Advised to follow-up with gastroenterology as outpatient to set up colonoscopy. ? ?Type 2 diabetes mellitus with hyperlipidemia ([place]) ?Hemoglobin A1c 7.1.  Continue Zocor upon discharge.  At this point can be diet controlled.  Last for sugars 119, 185, 136 and 157.  We will leave it up to PMD on whether to start  oral medications as outpatient. ? ? ?Essential hypertension ?Continue coreg and hold amlodipine with blood pressure being a little on the lower side this morning.  The patient has a blood pressure cuff at home and will follow-up with PMD next week. ? ? ? ?S/P CABG x 2 ?Hold aspirin with rectal bleeding.  Can go back on Zocor. ? ?Bilateral carotid bruits ?Follow-up as outpatient. ? ? ? ? ?  ? ? ?Consultants: Gastroenterology ?Procedures performed: Patient declined flexible sigmoidoscopy here in the hospital ?Disposition: Home ?Diet recommendation:  ?Cardiac and Carb modified diet ?DISCHARGE MEDICATION: ?Allergies as of 08/29/2021   ? ?   Reactions  ? Jardiance [empagliflozin] Diarrhea, Nausea And Vomiting  ? Severe GI symptoms  ? Penicillins Rash  ? Penicillin G Rash  ? ?  ? ?  ?Medication List  ?  ? ?STOP taking these medications   ? ?amLODipine 10 MG tablet ?Commonly known as: NORVASC ?  ?aspirin EC 81 MG tablet ?  ?Calcium 600-200 MG-UNIT tablet ?  ?docusate sodium 100 MG capsule ?Commonly known as: COLACE ?  ?hydrochlorothiazide 12.5 MG capsule ?Commonly known as: MICROZIDE ?  ?lisinopril 40 MG tablet ?Commonly known as: ZESTRIL ?  ? ?  ? ?TAKE these medications   ? ?acetaminophen 325 MG tablet ?Commonly known as: TYLENOL ?Take 2 tablets (650 mg total) by mouth every 6 (six) hours as needed for mild pain. ?  ?BENADRYL ALLERGY PO ?Take by mouth as needed. ?  ?carvedilol 6.25 MG tablet ?Commonly known as: COREG ?TAKE 1 TABLET TWICE  DAILY WITH MEALS ?  ?ciprofloxacin 250 MG tablet ?Commonly known as: CIPRO ?Take 1 tablet (250 mg total) by mouth 2 (two) times daily for 5 days. ?  ?metroNIDAZOLE 500 MG tablet ?Commonly known as: FLAGYL ?Take 1 tablet (500 mg total) by mouth every 12 (twelve) hours for 5 days. ?  ?nitroGLYCERIN 0.4 MG SL tablet ?Commonly known as: NITROSTAT ?Place 1 tablet (0.4 mg total) under the tongue every 5 (five) minutes as needed for chest pain. ?  ?pantoprazole 40 MG tablet ?Commonly known as:  PROTONIX ?Take 1 tablet (40 mg total) by mouth daily. ?  ?simvastatin 20 MG tablet ?Commonly known as: ZOCOR ?TAKE 1 TABLET EVERY DAY AT 6PM ?What changed: See the new instructions. ?  ? ?  ? ? Follow-up Information   ? ? Tama High III, MD Follow up in 5 day(s).   ?Specialty: Internal Medicine ?Contact information: ?[facility] ?[place] [address] ?[phone] ? ? ?  ?  ? ? Annamaria Helling, DO Follow up in 2 week(s).   ?Specialty: Gastroenterology ?Contact information: ?[facility] ?[place] [address] ?[phone] ? ? ?  ?  ? ?  ?  ? ?  ? ?Discharge Exam: ?Filed Weights  ? 08/27/21 1339 08/29/21 0537  ?Weight: 86.2 kg 85.8 kg  ? ?Physical Exam ?HENT:  ?   Head: Normocephalic.  ?   Mouth/Throat:  ?   Pharynx: No oropharyngeal exudate.  ?Eyes:  ?   General: Lids are normal.  ?   Conjunctiva/sclera: Conjunctivae normal.  ?Cardiovascular:  ?   Rate and Rhythm: Normal rate and regular rhythm.  ?   Heart sounds: Normal heart sounds, S1 normal and S2 normal.  ?Pulmonary:  ?   Breath sounds: Normal breath sounds. No decreased breath sounds, wheezing, rhonchi or rales.  ?Abdominal:  ?   Palpations: Abdomen is soft.  ?   Tenderness: There is abdominal tenderness in the left lower quadrant.  ?Musculoskeletal:  ?   Right lower leg: Swelling present.  ?   Left lower leg: Swelling present.  ?Skin: ?   General: Skin is warm.  ?   Findings: No rash.  ?Neurological:  ?   Mental Status: She is alert and oriented to person, place, and time.  ?  ? ?Condition at discharge: stable ? ?The results of significant diagnostics from this hospitalization (including imaging, microbiology, ancillary and laboratory) are listed below for reference.  ? ?Imaging Studies: ?CT ABDOMEN PELVIS W CONTRAST ? ?Result Date: 08/27/2021 ?CLINICAL DATA:  Abdominal pain rectal bleeding. EXAM: CT ABDOMEN AND PELVIS WITH CONTRAST TECHNIQUE: Multidetector CT imaging of the abdomen and pelvis  was performed using the standard protocol following bolus administration of intravenous contrast. RADIATION DOSE REDUCTION: This exam was performed according to the departmental dose-optimization program which includes automated exposure control, adjustment of the mA and/or kV according to patient size and/or use of iterative reconstruction technique. CONTRAST:  106m OMNIPAQUE IOHEXOL 300 MG/ML  SOLN COMPARISON:  None FINDINGS: Lower chest: The lung bases are clear of acute process. No pleural effusion or pulmonary lesions. The heart is normal in size. No pericardial effusion. The distal esophagus and aorta are unremarkable. Hepatobiliary: No hepatic lesions or intrahepatic biliary dilatation. Gallbladder is unremarkable. No common bile duct dilatation. Portal and hepatic veins are patent. Pancreas: No mass, inflammation or ductal dilatation. Spleen: Normal size.  No focal lesions. Adrenals/Urinary Tract: The adrenal glands are normal. No renal lesions, renal calculi or  hydroureteronephrosis. The bladder is grossly normal. Stomach/Bowel: The stomach, duodenum and small bowel are unremarkable. The right and transverse colon are unremarkable. There is severe colitis involving the hepatic flexure, descending colon and mid upper sigmoid colon with severe submucosal edema. There is mucosal and serosal enhancement making ischemic colitis very unlikely. This is likely infectious inflammatory. Vascular/Lymphatic: Advanced atherosclerotic calcifications involving the aorta and iliac arteries. The celiac axis, SMA and IMA are patent. No mesenteric or retroperitoneal mass or adenopathy the. Reproductive: The uterus and ovaries are unremarkable. Other: No free air or free fluid. Musculoskeletal: No significant bony findings. Advanced lumbar facet disease noted. Advanced degenerative disc disease at L2-3. Moderate bilateral hip joint degenerative changes. IMPRESSION: 1. Severe left-sided colitis. This is likely infectious or  inflammatory. 2. No other significant abdominal/pelvic findings. 3. Advanced atherosclerotic calcifications involving the aorta and iliac arteries. Aortic Atherosclerosis (ICD10-I70.0). Electronically Signed

## 2021-08-31 ENCOUNTER — Ambulatory Visit: Payer: Medicare PPO | Admitting: Medical

## 2021-09-10 ENCOUNTER — Ambulatory Visit
Admission: RE | Admit: 2021-09-10 | Discharge: 2021-09-10 | Disposition: A | Discharge status: Home / Self Care | Payer: Medicare PPO | Source: Ambulatory Visit | Attending: Internal Medicine | Admitting: Internal Medicine

## 2021-09-10 DIAGNOSIS — Z1231 Encounter for screening mammogram for malignant neoplasm of breast: Secondary | ICD-10-CM | POA: Insufficient documentation

## 2021-09-16 ENCOUNTER — Ambulatory Visit: Payer: Medicare PPO | Admitting: Cardiovascular Disease

## 2021-09-16 ENCOUNTER — Encounter: Payer: Self-pay | Admitting: Cardiovascular Disease

## 2021-09-16 VITALS — BP 138/60 | HR 59 | Ht 60.0 in | Wt 192.4 lb

## 2021-09-16 DIAGNOSIS — R0989 Other specified symptoms and signs involving the circulatory and respiratory systems: Secondary | ICD-10-CM | POA: Diagnosis not present

## 2021-09-16 DIAGNOSIS — I10 Essential (primary) hypertension: Secondary | ICD-10-CM | POA: Diagnosis not present

## 2021-09-16 DIAGNOSIS — E785 Hyperlipidemia, unspecified: Secondary | ICD-10-CM

## 2021-09-16 DIAGNOSIS — I2510 Atherosclerotic heart disease of native coronary artery without angina pectoris: Secondary | ICD-10-CM

## 2021-09-16 DIAGNOSIS — I739 Peripheral vascular disease, unspecified: Secondary | ICD-10-CM

## 2021-09-16 MED ORDER — ASPIRIN 81 MG PO TBEC: 81.0000 mg | DELAYED_RELEASE_TABLET | Freq: Every day | ORAL | 3 refills | Status: AC

## 2021-09-16 NOTE — Progress Notes (Signed)
[id number]    Cardiology Office Note   Date:  09/16/2021   ID:  Kerri Hicks, [name] [birth year], MRN [id number]  PCP:  Adin Hector, MD  Cardiologist:   Kathlyn Sacramento, MD   Chief Complaint  Patient presents with   Other    OD 12 month f/u no complaints today. Meds reviewed verbally with pt.      History of Present Illness: Kerri Hicks is a 76 y.o. female who presents for a follow-up visit regarding coronary artery disease status post CABG in June of 2017. She presented with anterior ST elevation myocardial infarction and was found to have significant two-vessel coronary artery disease including ostial LAD with normal ejection fraction. She underwent CABG 2 with LIMA to LAD and SVG to right PDA. She had postoperative atrial fibrillation. She is known to have moderate bilateral carotid disease, hyperlipidemia and previous tobacco use. She had chest pain and shortness of breath in July of last year.  She underwent a Lexiscan Myoview which showed no evidence of ischemia.  There was anterior wall defect suspicious for breast attenuation artifact.   She is known to have peripheral arterial disease with borderline stenosis affecting the distal aorta into bilateral common iliac arteries.  This is being treated medically given that her symptoms are not lifestyle limiting. She also has known history of refractory hypertension with no evidence of renal artery stenosis on previous renal artery duplex in 2020.  She was hospitalized earlier this month with abdominal pain and rectal bleeding and was diagnosed with left-sided colitis.  She was treated with antibiotics.  Blood in stool resolved completely.  She has been doing well with no recent chest pain, shortness of breath or palpitations.  No significant leg claudication.  She is bothered by bilateral hip pain she is known to have arthritis.  Past Medical History:  Diagnosis Date   Carotid arterial disease ([place])    a. 10/2016 Carotid U/S:  RICA 7-62, LICA 83-15, bilat ECAs >50; b. 06/2019 U/S: bilat ICA 40-59%, bilat ECA >50%-->f/u 1 yr.   Celiac artery stenosis ([place])    a. 10/2018 Abd duplex: 70-99% celiac artery stenosis.   Controlled type 2 diabetes mellitus with complication, without long-term current use of insulin ([facility])    Coronary artery disease    a. 09/2015 Anterior STEMI w/ ostial LAD disease-->CABG x 2 (LIMA to LAD and SVG to RPDA).   Diabetes mellitus without complication ([place])    History of echocardiogram    a. 09/2015 TEE (intraop): EF 50-55%.   Hyperlipidemia    Hypertension    a. 10/2018 Renal artery duplex: 1-59% bilat RA stenosis.   Tobacco abuse 09/30/2015    Past Surgical History:  Procedure Laterality Date   ANKLE SURGERY Left    CARDIAC CATHETERIZATION N/A 09/30/2015   Procedure: Left Heart Cath and Coronary Angiography;  Surgeon: Leonie Man, MD;  Location: [facility];  Service: Cardiovascular;  Laterality: N/A;   CATARACT EXTRACTION W/PHACO Left 02/20/2020   Procedure: CATARACT EXTRACTION PHACO AND INTRAOCULAR LENS PLACEMENT (IOC) LEFT 10.47 01:32.2 11.3%;  Surgeon: Leandrew Koyanagi, MD;  Location: [facility];  Service: Ophthalmology;  Laterality: Left;   CATARACT EXTRACTION W/PHACO Right 03/12/2020   Procedure: CATARACT EXTRACTION PHACO AND INTRAOCULAR LENS PLACEMENT (IOC) RIGHT 9.52 01:26.2 11.1%;  Surgeon: Leandrew Koyanagi, MD;  Location: [facility];  Service: Ophthalmology;  Laterality: Right;   CORONARY ARTERY BYPASS GRAFT N/A 09/30/2015   Procedure: CORONARY ARTERY BYPASS GRAFTING (CABG)  TIMES TWO USING LEFT INTERNAL MAMMARY ARTERY AND ENDOSCOPICALLY HARVESTED RIGHT SAPHENOUS VEIN GRAFT;  Surgeon: Gaye Pollack, MD;  Location: [facility];  Service: Open Heart Surgery;  Laterality: N/A;     Current Outpatient Medications  Medication Sig Dispense Refill   acetaminophen (TYLENOL) 325 MG tablet Take 2 tablets (650 mg total) by mouth every 6 (six) hours as needed for  mild pain.     carvedilol (COREG) 6.25 MG tablet TAKE 1 TABLET TWICE DAILY WITH MEALS (Patient taking differently: Take 6.25 mg by mouth 2 (two) times daily with a meal.) 180 tablet 0   diphenhydrAMINE HCl (BENADRYL ALLERGY PO) Take by mouth as needed.     nitroGLYCERIN (NITROSTAT) 0.4 MG SL tablet Place 1 tablet (0.4 mg total) under the tongue every 5 (five) minutes as needed for chest pain. 90 tablet 3   pantoprazole (PROTONIX) 40 MG tablet Take 1 tablet (40 mg total) by mouth daily. 30 tablet 0   simvastatin (ZOCOR) 20 MG tablet TAKE 1 TABLET EVERY DAY AT 6PM (Patient taking differently: Take 20 mg by mouth daily at 6 PM.) 90 tablet 0   No current facility-administered medications for this visit.    Allergies:   Jardiance [empagliflozin], Penicillins, and Penicillin g    Social History:  The patient  reports that she quit smoking about 5 years ago. Her smoking use included cigarettes. She has a 50.00 pack-year smoking history. She has never used smokeless tobacco. She reports that she does not currently use alcohol. She reports that she does not use drugs.   Family History:  The patient's family history includes Heart attack in her mother; Heart disease in her brother and mother; Leukemia in her father.    ROS:  Please see the history of present illness.   Otherwise, review of systems are positive for none.   All other systems are reviewed and negative.    PHYSICAL EXAM: VS:  BP 138/60 (BP Location: Left Arm, Patient Position: Sitting, Cuff Size: Normal)   Pulse (!) 59   Ht 5' (1.524 m)   Wt 192 lb 6 oz (87.3 kg)   SpO2 98%   BMI 37.57 kg/m  , BMI Body mass index is 37.57 kg/m. GEN: Well nourished, well developed, in no acute distress  HEENT: normal  Neck: no JVD or masses. Bilateral carotid bruits louder on the right side Cardiac: RRR; no rubs, or gallops . 2/ 6 systolic ejection murmur in the aortic area.  No significant edema.  Respiratory:  clear to auscultation bilaterally,  normal work of breathing GI: soft, nontender, nondistended, + BS MS: no deformity or atrophy  Skin: warm and dry, no rash Neuro:  Strength and sensation are intact Psych: euthymic mood, full affect   EKG:  EKG is ordered today. The ekg ordered today demonstrates sinus bradycardia with no significant ST or T wave changes.    Recent Labs: 08/28/2021: ALT 8 08/29/2021: BUN 17; Creatinine, Ser 1.48; Hemoglobin 10.0; Platelets 270; Potassium 4.6; Sodium 139    Lipid Panel    Component Value Date/Time   CHOL 147 12/02/2015 0849   TRIG 130 12/02/2015 0849   HDL 51 12/02/2015 0849   CHOLHDL 2.9 12/02/2015 0849   VLDL 26 12/02/2015 0849   LDLCALC 70 12/02/2015 0849      Wt Readings from Last 3 Encounters:  09/16/21 192 lb 6 oz (87.3 kg)  08/29/21 189 lb 3.2 oz (85.8 kg)  06/12/20 202 lb (91.6 kg)  ASSESSMENT AND PLAN:  1.  Coronary artery disease involving native coronary arteries without angina: She is doing very well with no anginal symptoms.  Continue medical therapy.  I asked her to resume aspirin 81 mg once daily.   2. Bilateral carotid disease: Most recent Doppler showed stable moderate left carotid stenosis.  I requested a follow-up carotid Doppler.  3. Hyperlipidemia: I reviewed most recent lipid profile done in February which showed an LDL of 57 and triglyceride of 120.  Continue treatment with simvastatin milligram daily.  4. Essential Hypertension: Blood pressure is reasonably controlled on current medications.  Previous renal artery duplex showed no renal artery stenosis.  5.  Peripheral arterial disease: The patient has evidence of moderate distal aortic disease extending into the common iliac arteries bilaterally.  She seems to be bothered mostly by arthritis symptoms with minimal claudication.  Continue medical therapy.   Disposition:   FU with me in 12 months  Signed,  Kathlyn Sacramento, MD  09/16/2021 11:08 AM    [place]

## 2021-09-16 NOTE — Patient Instructions (Signed)
Medication Instructions:  Your physician has recommended you make the following change in your medication:   RESTART aspirin 81 mg daily   *If you need a refill on your cardiac medications before your next appointment, please call your pharmacy*   Lab Work: None ordered  If you have labs (blood work) drawn today and your tests are completely normal, you will receive your results only by: [place] (if you have MyChart) OR A paper copy in the mail If you have any lab test that is abnormal or we need to change your treatment, we will call you to review the results.   Testing/Procedures:  Your physician has requested that you have a carotid duplex. This test is an ultrasound of the carotid arteries in your neck. It looks at blood flow through these arteries that supply the brain with blood. Allow one hour for this exam. There are no restrictions or special instructions.    Follow-Up: At [facility], you and your health needs are our priority.  As part of our continuing mission to provide you with exceptional heart care, we have created designated Provider Care Teams.  These Care Teams include your primary Cardiologist (physician) and Advanced Practice Providers (APPs -  Physician Assistants and Nurse Practitioners) who all work together to provide you with the care you need, when you need it.  We recommend signing up for the patient portal called "MyChart".  Sign up information is provided on this After Visit Summary.  MyChart is used to connect with patients for Virtual Visits (Telemedicine).  Patients are able to view lab/test results, encounter notes, upcoming appointments, etc.  Non-urgent messages can be sent to your provider as well.   To learn more about what you can do with MyChart, go to [email or web address].    Your next appointment:    Your physician wants you to follow-up in: 1 year.  You will receive a reminder letter in the mail two months in advance. If you  don't receive a letter, please call our office to schedule the follow-up appointment.   The format for your next appointment:   In Person  Provider:   You may see Kathlyn Sacramento, MD or one of the following Advanced Practice Providers on your designated Care Team:   Murray Hodgkins, NP Christell Faith, PA-C Cadence Kathlen Mody, [place]   Other Instructions N/A  Important Information About Sugar

## 2021-09-25 ENCOUNTER — Ambulatory Visit (INDEPENDENT_AMBULATORY_CARE_PROVIDER_SITE_OTHER): Payer: Medicare PPO

## 2021-09-25 DIAGNOSIS — R0989 Other specified symptoms and signs involving the circulatory and respiratory systems: Secondary | ICD-10-CM

## 2021-10-01 ENCOUNTER — Other Ambulatory Visit: Payer: Self-pay

## 2021-10-01 DIAGNOSIS — R0989 Other specified symptoms and signs involving the circulatory and respiratory systems: Secondary | ICD-10-CM

## 2021-10-01 NOTE — Progress Notes (Signed)
car

## 2021-10-16 ENCOUNTER — Ambulatory Visit: Payer: Medicare PPO | Admitting: Medical

## 2021-10-29 ENCOUNTER — Ambulatory Visit: Payer: Medicare PPO | Admitting: Cardiovascular Disease

## 2021-11-10 ENCOUNTER — Other Ambulatory Visit: Payer: Self-pay | Admitting: Cardiovascular Disease

## 2022-01-13 ENCOUNTER — Other Ambulatory Visit: Payer: Self-pay | Admitting: Cardiovascular Disease

## 2022-02-24 ENCOUNTER — Encounter: Payer: Self-pay | Admitting: Gastroenterology

## 2022-02-25 ENCOUNTER — Ambulatory Visit: Admission: RE | Admit: 2022-02-25 | Payer: Medicare PPO | Source: Home / Self Care | Admitting: Gastroenterology

## 2022-02-25 ENCOUNTER — Encounter: Admission: RE | Payer: Self-pay | Source: Home / Self Care

## 2022-02-25 ENCOUNTER — Encounter: Payer: Self-pay | Admitting: Certified Registered"

## 2022-02-25 SURGERY — COLONOSCOPY
Anesthesia: General

## 2022-05-26 NOTE — H&P (Signed)
Pre-Procedure H&P   Patient ID: Kerri Hicks is a 77 y.o. female.  Gastroenterology Provider: Annamaria Helling, DO  Referring Provider: Octavia Bruckner, PA PCP: Adin Hector, MD  Date: 05/27/2022  HPI Kerri Hicks is a 77 y.o. female who presents today for Colonoscopy for Colitis, bright red blood per rectum .  Pt was seen in the hospital in May of 2023 with lower abdominal pain and brbpr. CT revealed left sided colitis. She was treated with abx and this resolved. Her preference was to undergo colonoscopy as an outpatient over flex sig as in patient. Infectious w/u was negative. No previous colonoscopy. Cologuard was negative in 07/2020. She deals with mild constipation. A1c 7 cr 1.2 hgb 11 mcv 90 plt 300. A fib not on anticoagulation. Celiac arter stenosis. Cabg x 2. Previous tobacco use    Past Medical History:  Diagnosis Date   Carotid arterial disease ([place])    a. 10/2016 Carotid U/S: RICA 9-89, LICA 21-19, bilat ECAs >50; b. 06/2019 U/S: bilat ICA 40-59%, bilat ECA >50%-->f/u 1 yr.   Celiac artery stenosis ([place])    a. 10/2018 Abd duplex: 70-99% celiac artery stenosis.   Controlled type 2 diabetes mellitus with complication, without long-term current use of insulin (HCC)    Coronary artery disease    a. 09/2015 Anterior STEMI w/ ostial LAD disease-->CABG x 2 (LIMA to LAD and SVG to RPDA).   Diabetes mellitus without complication ([place])    History of echocardiogram    a. 09/2015 TEE (intraop): EF 50-55%.   Hyperlipidemia    Hypertension    a. 10/2018 Renal artery duplex: 1-59% bilat RA stenosis.   Tobacco abuse 09/30/2015    Past Surgical History:  Procedure Laterality Date   ANKLE SURGERY Left    CARDIAC CATHETERIZATION N/A 09/30/2015   Procedure: Left Heart Cath and Coronary Angiography;  Surgeon: Leonie Man, MD;  Location: [facility];  Service: Cardiovascular;  Laterality: N/A;   CATARACT EXTRACTION W/PHACO Left 02/20/2020   Procedure:  CATARACT EXTRACTION PHACO AND INTRAOCULAR LENS PLACEMENT (IOC) LEFT 10.47 01:32.2 11.3%;  Surgeon: Leandrew Koyanagi, MD;  Location: [place];  Service: Ophthalmology;  Laterality: Left;   CATARACT EXTRACTION W/PHACO Right 03/12/2020   Procedure: CATARACT EXTRACTION PHACO AND INTRAOCULAR LENS PLACEMENT (IOC) RIGHT 9.52 01:26.2 11.1%;  Surgeon: Leandrew Koyanagi, MD;  Location: [place];  Service: Ophthalmology;  Laterality: Right;   CORONARY ANGIOPLASTY     CORONARY ARTERY BYPASS GRAFT N/A 09/30/2015   Procedure: CORONARY ARTERY BYPASS GRAFTING (CABG) TIMES TWO USING LEFT INTERNAL MAMMARY ARTERY AND ENDOSCOPICALLY HARVESTED RIGHT SAPHENOUS VEIN GRAFT;  Surgeon: Gaye Pollack, MD;  Location: [facility];  Service: Open Heart Surgery;  Laterality: N/A;   EYE SURGERY      Family History No h/o GI disease or malignancy  Review of Systems  Constitutional:  Negative for activity change, appetite change, chills, diaphoresis, fatigue, fever and unexpected weight change.  HENT:  Negative for trouble swallowing and voice change.   Respiratory:  Negative for shortness of breath and wheezing.   Cardiovascular:  Negative for chest pain, palpitations and leg swelling.  Gastrointestinal:  Positive for constipation. Negative for abdominal distention, abdominal pain, anal bleeding, blood in stool, diarrhea, nausea, rectal pain and vomiting.  Musculoskeletal:  Negative for arthralgias and myalgias.  Skin:  Negative for color change and pallor.  Neurological:  Negative for dizziness, syncope and weakness.  Psychiatric/Behavioral:  Negative for confusion.  All other systems reviewed and are negative.    Medications No current facility-administered medications on file prior to encounter.   Current Outpatient Medications on File Prior to Encounter  Medication Sig Dispense Refill   acetaminophen (TYLENOL) 325 MG tablet Take 2 tablets (650 mg total) by mouth every 6 (six) hours as  needed for mild pain.     aspirin EC 81 MG tablet Take 1 tablet (81 mg total) by mouth daily. Swallow whole. 90 tablet 3   carvedilol (COREG) 6.25 MG tablet TAKE 1 TABLET TWICE DAILY WITH MEALS 180 tablet 3   diphenhydrAMINE HCl (BENADRYL ALLERGY PO) Take by mouth as needed.     pantoprazole (PROTONIX) 40 MG tablet Take 1 tablet (40 mg total) by mouth daily. 30 tablet 0   simvastatin (ZOCOR) 20 MG tablet TAKE 1 TABLET EVERY DAY AT 6PM 90 tablet 3   nitroGLYCERIN (NITROSTAT) 0.4 MG SL tablet Place 1 tablet (0.4 mg total) under the tongue every 5 (five) minutes as needed for chest pain. 90 tablet 3    Pertinent medications related to GI and procedure were reviewed by me with the patient prior to the procedure   Current Facility-Administered Medications:    0.9 %  sodium chloride infusion, , Intravenous, Continuous, Annamaria Helling, DO, Last Rate: 20 mL/hr at 05/27/22 0912, New Bag at 05/27/22 0912      Allergies  Allergen Reactions   Jardiance [Empagliflozin] Diarrhea and Nausea And Vomiting    Severe GI symptoms   Penicillins Rash   Penicillin G Rash   Allergies were reviewed by me prior to the procedure  Objective   Body mass index is 36.91 kg/m. Vitals:   05/27/22 0854  BP: (!) 155/58  Pulse: 71  Resp: 16  Temp: (!) 97.4 F (36.3 C)  TempSrc: Temporal  SpO2: 99%  Weight: 85.7 kg  Height: 5' (1.524 m)     Physical Exam Vitals and nursing note reviewed.  Constitutional:      General: She is not in acute distress.    Appearance: Normal appearance. She is obese. She is not ill-appearing, toxic-appearing or diaphoretic.  HENT:     Head: Normocephalic and atraumatic.     Nose: Nose normal.     Mouth/Throat:     Mouth: Mucous membranes are moist.     Pharynx: Oropharynx is clear.  Eyes:     General: No scleral icterus.    Extraocular Movements: Extraocular movements intact.  Cardiovascular:     Rate and Rhythm: Normal rate and regular rhythm.     Heart  sounds: Normal heart sounds. No murmur heard.    No friction rub. No gallop.     Comments: CABG Scar present Pulmonary:     Effort: Pulmonary effort is normal. No respiratory distress.     Breath sounds: Normal breath sounds. No wheezing, rhonchi or rales.  Abdominal:     General: Bowel sounds are normal. There is no distension.     Palpations: Abdomen is soft.     Tenderness: There is no abdominal tenderness. There is no guarding or rebound.  Musculoskeletal:     Cervical back: Neck supple.     Right lower leg: No edema.     Left lower leg: No edema.  Skin:    General: Skin is warm and dry.     Coloration: Skin is not jaundiced or pale.  Neurological:     General: No focal deficit present.     Mental Status: She is  alert and oriented to person, place, and time. Mental status is at baseline.  Psychiatric:        Mood and Affect: Mood normal.        Behavior: Behavior normal.        Thought Content: Thought content normal.        Judgment: Judgment normal.      Assessment:  Kerri Hicks is a 77 y.o. female  who presents today for Colonoscopy for Colitis, bright red blood per rectum .  Plan:  Colonoscopy with possible intervention today  Colonoscopy with possible biopsy, control of bleeding, polypectomy, and interventions as necessary has been discussed with the patient/patient representative. Informed consent was obtained from the patient/patient representative after explaining the indication, nature, and risks of the procedure including but not limited to death, bleeding, perforation, missed neoplasm/lesions, cardiorespiratory compromise, and reaction to medications. Opportunity for questions was given and appropriate answers were provided. Patient/patient representative has verbalized understanding is amenable to undergoing the procedure.   Annamaria Helling, DO  [facility] Gastroenterology  Portions of the record may have been created with voice recognition  software. Occasional wrong-word or 'sound-a-like' substitutions may have occurred due to the inherent limitations of voice recognition software.  Read the chart carefully and recognize, using context, where substitutions may have occurred.

## 2022-05-27 ENCOUNTER — Ambulatory Visit
Admission: RE | Admit: 2022-05-27 | Discharge: 2022-05-27 | Disposition: A | Discharge status: Home / Self Care | Payer: Medicare PPO | Attending: Gastroenterology | Admitting: Gastroenterology

## 2022-05-27 ENCOUNTER — Encounter: Payer: Self-pay | Admitting: Gastroenterology

## 2022-05-27 ENCOUNTER — Ambulatory Visit: Payer: Medicare PPO | Admitting: Anesthesiology

## 2022-05-27 ENCOUNTER — Other Ambulatory Visit: Payer: Self-pay

## 2022-05-27 ENCOUNTER — Encounter
Admission: RE | Disposition: A | Discharge status: Home / Self Care | Payer: Self-pay | Source: Home / Self Care | Attending: Gastroenterology

## 2022-05-27 DIAGNOSIS — K5140 Inflammatory polyps of colon without complications: Secondary | ICD-10-CM | POA: Diagnosis not present

## 2022-05-27 DIAGNOSIS — K5730 Diverticulosis of large intestine without perforation or abscess without bleeding: Secondary | ICD-10-CM | POA: Diagnosis not present

## 2022-05-27 DIAGNOSIS — I4891 Unspecified atrial fibrillation: Secondary | ICD-10-CM | POA: Diagnosis not present

## 2022-05-27 DIAGNOSIS — K5900 Constipation, unspecified: Secondary | ICD-10-CM | POA: Diagnosis not present

## 2022-05-27 DIAGNOSIS — K621 Rectal polyp: Secondary | ICD-10-CM | POA: Insufficient documentation

## 2022-05-27 DIAGNOSIS — K640 First degree hemorrhoids: Secondary | ICD-10-CM | POA: Insufficient documentation

## 2022-05-27 DIAGNOSIS — I10 Essential (primary) hypertension: Secondary | ICD-10-CM | POA: Insufficient documentation

## 2022-05-27 DIAGNOSIS — E785 Hyperlipidemia, unspecified: Secondary | ICD-10-CM | POA: Insufficient documentation

## 2022-05-27 DIAGNOSIS — I2510 Atherosclerotic heart disease of native coronary artery without angina pectoris: Secondary | ICD-10-CM | POA: Insufficient documentation

## 2022-05-27 DIAGNOSIS — Z8719 Personal history of other diseases of the digestive system: Secondary | ICD-10-CM | POA: Diagnosis not present

## 2022-05-27 DIAGNOSIS — K56699 Other intestinal obstruction unspecified as to partial versus complete obstruction: Secondary | ICD-10-CM | POA: Insufficient documentation

## 2022-05-27 DIAGNOSIS — I252 Old myocardial infarction: Secondary | ICD-10-CM | POA: Diagnosis not present

## 2022-05-27 DIAGNOSIS — R933 Abnormal findings on diagnostic imaging of other parts of digestive tract: Secondary | ICD-10-CM | POA: Insufficient documentation

## 2022-05-27 DIAGNOSIS — K635 Polyp of colon: Secondary | ICD-10-CM | POA: Diagnosis not present

## 2022-05-27 DIAGNOSIS — E119 Type 2 diabetes mellitus without complications: Secondary | ICD-10-CM | POA: Insufficient documentation

## 2022-05-27 DIAGNOSIS — D125 Benign neoplasm of sigmoid colon: Secondary | ICD-10-CM | POA: Diagnosis not present

## 2022-05-27 DIAGNOSIS — D175 Benign lipomatous neoplasm of intra-abdominal organs: Secondary | ICD-10-CM | POA: Insufficient documentation

## 2022-05-27 DIAGNOSIS — Z87891 Personal history of nicotine dependence: Secondary | ICD-10-CM | POA: Diagnosis not present

## 2022-05-27 DIAGNOSIS — Z09 Encounter for follow-up examination after completed treatment for conditions other than malignant neoplasm: Secondary | ICD-10-CM | POA: Diagnosis present

## 2022-05-27 DIAGNOSIS — Z951 Presence of aortocoronary bypass graft: Secondary | ICD-10-CM | POA: Insufficient documentation

## 2022-05-27 HISTORY — PX: COLONOSCOPY: SHX5424

## 2022-05-27 SURGERY — COLONOSCOPY
Anesthesia: General

## 2022-05-27 MED ORDER — LIDOCAINE HCL (CARDIAC) PF 100 MG/5ML IV SOSY
PREFILLED_SYRINGE | INTRAVENOUS | Status: DC | PRN
  Administered 2022-05-27: 50 mg via INTRAVENOUS

## 2022-05-27 MED ORDER — EPHEDRINE SULFATE (PRESSORS) 50 MG/ML IJ SOLN
INTRAMUSCULAR | Status: DC | PRN
  Administered 2022-05-27: 5 mg via INTRAVENOUS

## 2022-05-27 MED ORDER — PROPOFOL 500 MG/50ML IV EMUL
INTRAVENOUS | Status: DC | PRN
  Administered 2022-05-27: 150 ug/kg/min via INTRAVENOUS

## 2022-05-27 MED ORDER — PROPOFOL 10 MG/ML IV BOLUS
INTRAVENOUS | Status: DC | PRN
  Administered 2022-05-27: 20 mg via INTRAVENOUS
  Administered 2022-05-27: 50 mg via INTRAVENOUS
  Administered 2022-05-27: 20 mg via INTRAVENOUS

## 2022-05-27 MED ORDER — SODIUM CHLORIDE 0.9 % IV SOLN: INTRAVENOUS | Status: DC

## 2022-05-27 NOTE — Op Note (Signed)
[facility] Gastroenterology Patient Name: Kerri Hicks Procedure Date: 05/27/2022 9:20 AM MRN: [id number] Account #: [id number] Date of Birth: 08-Mar-1946 Admit Type: Outpatient Age: 77 Room: [facility] ENDO ROOM 1 Gender: Female Note Status: Finalized Instrument Name: Colonoscope [id number] Procedure:             Colonoscopy Indications:           Abnormal CT of the GI tract Providers:             Rueben Bash, DO Referring MD:          Ramonita Lab, MD (Referring MD) Medicines:             Monitored Anesthesia Care Complications:         No immediate complications. Estimated blood loss:                         Minimal. Procedure:             Pre-Anesthesia Assessment:                        - Prior to the procedure, a History and Physical was                         performed, and patient medications and allergies were                         reviewed. The patient is competent. The risks and                         benefits of the procedure and the sedation options and                         risks were discussed with the patient. All questions                         were answered and informed consent was obtained.                         Patient identification and proposed procedure were                         verified by the physician, the nurse, the anesthetist                         and the technician in the endoscopy suite. Mental                         Status Examination: alert and oriented. Airway                         Examination: normal oropharyngeal airway and neck                         mobility. Respiratory Examination: clear to                         auscultation. CV Examination: RRR, no murmurs, no S3  or S4. Prophylactic Antibiotics: The patient does not                         require prophylactic antibiotics. Prior                         Anticoagulants: The patient has taken no anticoagulant                          or antiplatelet agents. ASA Grade Assessment: III - A                         patient with severe systemic disease. After reviewing                         the risks and benefits, the patient was deemed in                         satisfactory condition to undergo the procedure. The                         anesthesia plan was to use monitored anesthesia care                         (MAC). Immediately prior to administration of                         medications, the patient was re-assessed for adequacy                         to receive sedatives. The heart rate, respiratory                         rate, oxygen saturations, blood pressure, adequacy of                         pulmonary ventilation, and response to care were                         monitored throughout the procedure. The physical                         status of the patient was re-assessed after the                         procedure.                        After obtaining informed consent, the colonoscope was                         passed under direct vision. Throughout the procedure,                         the patient's blood pressure, pulse, and oxygen                         saturations were monitored continuously. The  Colonoscope was introduced through the anus and                         advanced to the the terminal ileum, with                         identification of the appendiceal orifice and IC                         valve. The colonoscopy was somewhat difficult due to                         bowel stenosis, a redundant colon and a tortuous                         colon. Successful completion of the procedure was                         aided by withdrawing the scope and replacing with the                         pediatric colonoscope and lavage. The patient                         tolerated the procedure well. The quality of the bowel                         preparation was evaluated  using the BBPS [facility] Bowel                         Preparation Scale) with scores of: Right Colon = 3,                         Transverse Colon = 3 and Left Colon = 3 (entire mucosa                         seen well with no residual staining, small fragments                         of stool or opaque liquid). The total BBPS score                         equals 9. The terminal ileum, ileocecal valve,                         appendiceal orifice, and rectum were photographed. Findings:      The perianal and digital rectal examinations were normal. Pertinent       negatives include normal sphincter tone.      The terminal ileum appeared normal. Estimated blood loss: none.      Multiple small-mouthed diverticula were found in the left colon.       Estimated blood loss: none.      A benign-appearing, intrinsic moderate stenosis measuring less than one       cm (in length) x 1.2 cm (inner diameter) was found at 23 cm proximal to       the anus and was traversed. Traversed after change  to pediatric       colonoscope. Unable to traverse with adult scope. Estimated blood loss:       none.      There was a medium-sized lipoma, 5 mm in diameter, in the cecum.       Estimated blood loss: none.      A 3 to 4 mm polyp was found in the sigmoid colon. The polyp was sessile.       The polyp was removed with a jumbo cold forceps. Resection and retrieval       were complete. Estimated blood loss was minimal.      Three sessile polyps were found in the rectum and transverse colon (2).       The polyps were 1 to 2 mm in size. These polyps were removed with a       jumbo cold forceps. Resection and retrieval were complete. Estimated       blood loss was minimal.      Non-bleeding internal hemorrhoids were found during retroflexion. The       hemorrhoids were Grade I (internal hemorrhoids that do not prolapse).       Estimated blood loss: none.      The exam was otherwise without abnormality on direct and  retroflexion       views. Impression:            - The examined portion of the ileum was normal.                        - Diverticulosis in the left colon.                        - Stricture at 23 cm proximal to the anus.                        - Medium-sized lipoma in the cecum.                        - One 3 to 4 mm polyp in the sigmoid colon, removed                         with a jumbo cold forceps. Resected and retrieved.                        - Three 1 to 2 mm polyps in the rectum and in the                         transverse colon, removed with a jumbo cold forceps.                         Resected and retrieved.                        - Non-bleeding internal hemorrhoids.                        - The examination was otherwise normal on direct and                         retroflexion views. Recommendation:        - Patient has a contact  number available for                         emergencies. The signs and symptoms of potential                         delayed complications were discussed with the patient.                         Return to normal activities tomorrow. Written                         discharge instructions were provided to the patient.                        - Discharge patient to home.                        - Resume previous diet.                        - Continue present medications.                        - No aspirin, ibuprofen, naproxen, or other                         non-steroidal anti-inflammatory drugs for 5 days after                         polyp removal.                        - Await pathology results.                        - Repeat colonoscopy for surveillance based on                         pathology results.                        - Return to GI office as previously scheduled.                        - The findings and recommendations were discussed with                         the patient. Procedure Code(s):     --- Professional ---                         [phone], Colonoscopy, flexible; with biopsy, single or                         multiple Diagnosis Code(s):     --- Professional ---                        K64.0, First degree hemorrhoids                        K56.699, Other intestinal obstruction unspecified as  to partial versus complete obstruction                        D17.5, Benign lipomatous neoplasm of intra-abdominal                         organs                        D12.5, Benign neoplasm of sigmoid colon                        D12.8, Benign neoplasm of rectum                        D12.3, Benign neoplasm of transverse colon (hepatic                         flexure or splenic flexure)                        K57.30, Diverticulosis of large intestine without                         perforation or abscess without bleeding                        R93.3, Abnormal findings on diagnostic imaging of                         other parts of digestive tract CPT copyright 2022 American [employer] Association. All rights reserved. The codes documented in this report are preliminary and upon coder review may  be revised to meet current compliance requirements. Attending Participation:      I personally performed the entire procedure. Volney American, DO Annamaria Helling DO, DO 05/27/2022 10:30:42 AM This report has been signed electronically. Number of Addenda: 0 Note Initiated On: 05/27/2022 9:20 AM Scope Withdrawal Time: 0 hours 24 minutes 42 seconds  Total Procedure Duration: 0 hours 39 minutes 28 seconds  Estimated Blood Loss:  Estimated blood loss was minimal.      [facility]

## 2022-05-27 NOTE — Anesthesia Preprocedure Evaluation (Signed)
Anesthesia Evaluation  Patient identified by MRN, date of birth, ID band Patient awake    Reviewed: Allergy & Precautions, NPO status , Patient's Chart, lab work & pertinent test results  Airway Mallampati: IV  TM Distance: >3 FB Neck ROM: full    Dental  (+) Chipped   Pulmonary neg pulmonary ROS, former smoker   Pulmonary exam normal        Cardiovascular hypertension, + CAD, + Past MI and + CABG  Normal cardiovascular exam  2021 ECHO IMPRESSIONS     1. Left ventricular ejection fraction, by estimation, is 60 to 65%. The  left ventricle has normal function. Left ventricular endocardial border  not optimally defined to evaluate regional wall motion. Left ventricular  diastolic parameters are consistent  with Grade II diastolic dysfunction (pseudonormalization). Elevated left  atrial pressure.   2. Right ventricular systolic function is normal. The right ventricular  size is normal. There is mildly elevated pulmonary artery systolic  pressure.   3. Left atrial size was mildly dilated.   4. The mitral valve is normal in structure. Mild mitral valve  regurgitation.   5. The aortic valve is tricuspid. Aortic valve regurgitation is mild. No  aortic stenosis is present.   6. The inferior vena cava is normal in size with greater than 50%  respiratory variability, suggesting right atrial pressure of 3 mmHg.     Neuro/Psych negative neurological ROS  negative psych ROS   GI/Hepatic negative GI ROS, Neg liver ROS,,,  Endo/Other  diabetes    Renal/GU negative Renal ROS  negative genitourinary   Musculoskeletal   Abdominal   Peds  Hematology negative hematology ROS (+)   Anesthesia Other Findings Past Medical History: No date: Carotid arterial disease ([place])     Comment:  a. 10/2016 Carotid U/S: RICA 8-92, LICA 11-94, bilat ECAs              >50; b. 06/2019 U/S: bilat ICA 40-59%, bilat ECA               >50%-->f/u 1  yr. No date: Celiac artery stenosis (HCC)     Comment:  a. 10/2018 Abd duplex: 70-99% celiac artery stenosis. No date: Controlled type 2 diabetes mellitus with complication,  without long-term current use of insulin (HCC) No date: Coronary artery disease     Comment:  a. 09/2015 Anterior STEMI w/ ostial LAD disease-->CABG x              2 (LIMA to LAD and SVG to RPDA). No date: Diabetes mellitus without complication ([place]) No date: History of echocardiogram     Comment:  a. 09/2015 TEE (intraop): EF 50-55%. No date: Hyperlipidemia No date: Hypertension     Comment:  a. 10/2018 Renal artery duplex: 1-59% bilat RA stenosis. 09/30/2015: Tobacco abuse  Past Surgical History: No date: ANKLE SURGERY; Left 09/30/2015: CARDIAC CATHETERIZATION; N/A     Comment:  Procedure: Left Heart Cath and Coronary Angiography;                Surgeon: Leonie Man, MD;  Location: [place] [facility];  Service: Cardiovascular;  Laterality: N/A; 02/20/2020: CATARACT EXTRACTION W/PHACO; Left     Comment:  Procedure: CATARACT EXTRACTION PHACO AND INTRAOCULAR               LENS PLACEMENT (IOC) LEFT 10.47 01:32.2 11.3%;  Surgeon:  Leandrew Koyanagi, MD;  Location: [place];              Service: Ophthalmology;  Laterality: Left; 03/12/2020: CATARACT EXTRACTION W/PHACO; Right     Comment:  Procedure: CATARACT EXTRACTION PHACO AND INTRAOCULAR               LENS PLACEMENT (IOC) RIGHT 9.52 01:26.2 11.1%;  Surgeon:               Leandrew Koyanagi, MD;  Location: [place];              Service: Ophthalmology;  Laterality: Right; No date: CORONARY ANGIOPLASTY 09/30/2015: CORONARY ARTERY BYPASS GRAFT; N/A     Comment:  Procedure: CORONARY ARTERY BYPASS GRAFTING (CABG) TIMES               TWO USING LEFT INTERNAL MAMMARY ARTERY AND ENDOSCOPICALLY              HARVESTED RIGHT SAPHENOUS VEIN GRAFT;  Surgeon: Gaye Pollack, MD;  Location: [place];  Service:  Open Heart               Surgery;  Laterality: N/A; No date: EYE SURGERY  BMI    Body Mass Index: 36.91 kg/m      Reproductive/Obstetrics negative OB ROS                             Anesthesia Physical Anesthesia Plan  ASA: 3  Anesthesia Plan: General   Post-op Pain Management:    Induction: Intravenous  PONV Risk Score and Plan: Propofol infusion and TIVA  Airway Management Planned: Natural Airway and Nasal Cannula  Additional Equipment:   Intra-op Plan:   Post-operative Plan:   Informed Consent: I have reviewed the patients History and Physical, chart, labs and discussed the procedure including the risks, benefits and alternatives for the proposed anesthesia with the patient or authorized representative who has indicated his/her understanding and acceptance.     Dental Advisory Given  Plan Discussed with: Anesthesiologist, CRNA and Surgeon  Anesthesia Plan Comments: (Patient consented for risks of anesthesia including but not limited to:  - adverse reactions to medications - risk of airway placement if required - damage to eyes, teeth, lips or other oral mucosa - nerve damage due to positioning  - sore throat or hoarseness - Damage to heart, brain, nerves, lungs, other parts of body or loss of life  Patient voiced understanding.)        Anesthesia Quick Evaluation

## 2022-05-27 NOTE — Transfer of Care (Signed)
Immediate Anesthesia Transfer of Care Note  Patient: Kerri Hicks  Procedure(s) Performed: COLONOSCOPY  Patient Location: PACU  Anesthesia Type:General  Level of Consciousness: awake  Airway & Oxygen Therapy: Patient Spontanous Breathing  Post-op Assessment: Report given to RN and Post -op Vital signs reviewed and stable  Post vital signs: Reviewed and stable  Last Vitals:  Vitals Value Taken Time  BP 72/29 05/27/22 1028  Temp    Pulse 65 05/27/22 1029  Resp 15 05/27/22 1029  SpO2 98 % 05/27/22 1029  Vitals shown include unvalidated device data.  Last Pain:  Vitals:   05/27/22 0854  TempSrc: Temporal  PainSc: 0-No pain         Complications: No notable events documented.

## 2022-05-27 NOTE — Interval H&P Note (Signed)
History and Physical Interval Note: Preprocedure H&P from 05/27/22  was reviewed and there was no interval change after seeing and examining the patient.  Written consent was obtained from the patient after discussion of risks, benefits, and alternatives. Patient has consented to proceed with Colonoscopy with possible intervention   05/27/2022 9:35 AM  Kerri Hicks  has presented today for surgery, with the diagnosis of Left sided colitis with rectal bleeding (CMS-HCC) (K51.511).  The various methods of treatment have been discussed with the patient and family. After consideration of risks, benefits and other options for treatment, the patient has consented to  Procedure(s): COLONOSCOPY (N/A) as a surgical intervention.  The patient's history has been reviewed, patient examined, no change in status, stable for surgery.  I have reviewed the patient's chart and labs.  Questions were answered to the patient's satisfaction.     Annamaria Helling

## 2022-05-27 NOTE — Anesthesia Postprocedure Evaluation (Signed)
Anesthesia Post Note  Patient: Kerri Hicks  Procedure(s) Performed: COLONOSCOPY  Patient location during evaluation: Endoscopy Anesthesia Type: General Level of consciousness: awake and alert Pain management: pain level controlled Vital Signs Assessment: post-procedure vital signs reviewed and stable Respiratory status: spontaneous breathing, nonlabored ventilation and respiratory function stable Cardiovascular status: blood pressure returned to baseline and stable Postop Assessment: no apparent nausea or vomiting Anesthetic complications: no   No notable events documented.   Last Vitals:  Vitals:   05/27/22 1038 05/27/22 1048  BP: 111/60 (!) 103/45  Pulse: 67 60  Resp: (!) 21 17  Temp:    SpO2: 98% 99%    Last Pain:  Vitals:   05/27/22 1048  TempSrc:   PainSc: 0-No pain                 Alphonsus Sias

## 2022-05-28 ENCOUNTER — Encounter: Payer: Self-pay | Admitting: Gastroenterology

## 2022-05-28 LAB — SURGICAL PATHOLOGY

## 2022-09-27 ENCOUNTER — Other Ambulatory Visit: Payer: Self-pay | Admitting: Internal Medicine

## 2022-09-27 DIAGNOSIS — Z1231 Encounter for screening mammogram for malignant neoplasm of breast: Secondary | ICD-10-CM

## 2022-09-29 ENCOUNTER — Ambulatory Visit
Admission: RE | Admit: 2022-09-29 | Discharge: 2022-09-29 | Disposition: A | Discharge status: Home / Self Care | Payer: Medicare PPO | Source: Ambulatory Visit | Attending: Internal Medicine | Admitting: Internal Medicine

## 2022-09-29 DIAGNOSIS — Z1231 Encounter for screening mammogram for malignant neoplasm of breast: Secondary | ICD-10-CM | POA: Diagnosis present

## 2022-09-30 ENCOUNTER — Ambulatory Visit: Payer: Medicare PPO | Attending: Cardiovascular Disease

## 2022-09-30 DIAGNOSIS — R0989 Other specified symptoms and signs involving the circulatory and respiratory systems: Secondary | ICD-10-CM

## 2022-10-01 ENCOUNTER — Other Ambulatory Visit: Payer: Self-pay | Admitting: *Deleted

## 2022-10-01 DIAGNOSIS — R0989 Other specified symptoms and signs involving the circulatory and respiratory systems: Secondary | ICD-10-CM

## 2022-10-28 ENCOUNTER — Ambulatory Visit: Payer: Medicare PPO | Attending: Cardiovascular Disease | Admitting: Cardiovascular Disease

## 2022-10-28 ENCOUNTER — Encounter: Payer: Self-pay | Admitting: Cardiovascular Disease

## 2022-10-28 VITALS — BP 140/60 | HR 63 | Ht 60.0 in | Wt 195.0 lb

## 2022-10-28 DIAGNOSIS — I739 Peripheral vascular disease, unspecified: Secondary | ICD-10-CM

## 2022-10-28 DIAGNOSIS — R0989 Other specified symptoms and signs involving the circulatory and respiratory systems: Secondary | ICD-10-CM

## 2022-10-28 DIAGNOSIS — I10 Essential (primary) hypertension: Secondary | ICD-10-CM | POA: Diagnosis not present

## 2022-10-28 DIAGNOSIS — E785 Hyperlipidemia, unspecified: Secondary | ICD-10-CM

## 2022-10-28 DIAGNOSIS — I2510 Atherosclerotic heart disease of native coronary artery without angina pectoris: Secondary | ICD-10-CM

## 2022-10-28 NOTE — Patient Instructions (Signed)
Medication Instructions:  No changes *If you need a refill on your cardiac medications before your next appointment, please call your pharmacy*   Lab Work: None ordered If you have labs (blood work) drawn today and your tests are completely normal, you will receive your results only by: MyChart Message (if you have MyChart) OR A paper copy in the mail If you have any lab test that is abnormal or we need to change your treatment, we will call you to review the results.   Testing/Procedures: Your physician has requested that you have a carotid duplex in June 2025.. This test is an ultrasound of the carotid arteries in your neck. It looks at blood flow through these arteries that supply the brain with blood.   Allow one hour for this exam.  There are no restrictions or special instructions.  This will take place at [address] [address], [place] [address]    Follow-Up: At [facility], you and your health needs are our priority.  As part of our continuing mission to provide you with exceptional heart care, we have created designated Provider Care Teams.  These Care Teams include your primary Cardiologist (physician) and Advanced Practice Providers (APPs -  Physician Assistants and Nurse Practitioners) who all work together to provide you with the care you need, when you need it.  We recommend signing up for the patient portal called "MyChart".  Sign up information is provided on this After Visit Summary.  MyChart is used to connect with patients for Virtual Visits (Telemedicine).  Patients are able to view lab/test results, encounter notes, upcoming appointments, etc.  Non-urgent messages can be sent to your provider as well.   To learn more about what you can do with MyChart, go to [email or web address].    Your next appointment:   12 month(s)  Provider:   You may see Lorine Bears, MD or one of the following Advanced Practice Providers on your  designated Care Team:   Nicolasa Ducking, NP Eula Listen, PA-C Cadence Fransico Michael, PA-C Charlsie Quest, NP

## 2022-10-28 NOTE — Progress Notes (Signed)
[id number]    Cardiology Office Note   Date:  10/28/2022   ID:  Kerri Hicks, Kerri Hicks April 03, 1946, MRN [id number]  PCP:  Lynnea Ferrier, MD  Cardiologist:   Lorine Bears, MD   Chief Complaint  Patient presents with   Follow-up    12 month f/u no complaints today. Meds reviewed verbally with pt.      History of Present Illness: Kerri Hicks is a 77 y.o. female who presents for a follow-up visit regarding coronary artery disease status post CABG in June of 2017. She presented with anterior ST elevation myocardial infarction and was found to have significant two-vessel coronary artery disease including ostial LAD with normal ejection fraction. She underwent CABG 2 with LIMA to LAD and SVG to right PDA. She had postoperative atrial fibrillation. She is known to have moderate bilateral carotid disease, hyperlipidemia and previous tobacco use.  She is known to have peripheral arterial disease with borderline stenosis affecting the distal aorta into bilateral common iliac arteries.  This is being treated medically given that her symptoms are not lifestyle limiting. She also has known history of refractory hypertension with no evidence of renal artery stenosis on previous renal artery duplex in 2020.  She was hospitalized in May 2023 with colitis.    She has been doing reasonably well with no recent chest pain or worsening dyspnea.  No significant lower extremity claudication but she does have chronic low back pain and arthritis discomfort.  Past Medical History:  Diagnosis Date   Carotid arterial disease (HCC)    a. 10/2016 Carotid U/S: RICA 1-39, LICA 40-59, bilat ECAs >50; b. 06/2019 U/S: bilat ICA 40-59%, bilat ECA >50%-->f/u 1 yr.   Celiac artery stenosis (HCC)    a. 10/2018 Abd duplex: 70-99% celiac artery stenosis.   Controlled type 2 diabetes mellitus with complication, without long-term current use of insulin (HCC)    Coronary artery disease    a. 09/2015 Anterior STEMI w/  ostial LAD disease-->CABG x 2 (LIMA to LAD and SVG to RPDA).   Diabetes mellitus without complication (HCC)    History of echocardiogram    a. 09/2015 TEE (intraop): EF 50-55%.   Hyperlipidemia    Hypertension    a. 10/2018 Renal artery duplex: 1-59% bilat RA stenosis.   Tobacco abuse 09/30/2015    Past Surgical History:  Procedure Laterality Date   ANKLE SURGERY Left    CARDIAC CATHETERIZATION N/A 09/30/2015   Procedure: Left Heart Cath and Coronary Angiography;  Surgeon: Marykay Lex, MD;  Location: [facility] INVASIVE CV LAB;  Service: Cardiovascular;  Laterality: N/A;   CATARACT EXTRACTION W/PHACO Left 02/20/2020   Procedure: CATARACT EXTRACTION PHACO AND INTRAOCULAR LENS PLACEMENT (IOC) LEFT 10.47 01:32.2 11.3%;  Surgeon: Lockie Mola, MD;  Location: [facility] SURGERY CNTR;  Service: Ophthalmology;  Laterality: Left;   CATARACT EXTRACTION W/PHACO Right 03/12/2020   Procedure: CATARACT EXTRACTION PHACO AND INTRAOCULAR LENS PLACEMENT (IOC) RIGHT 9.52 01:26.2 11.1%;  Surgeon: Lockie Mola, MD;  Location: [facility] SURGERY [facility];  Service: Ophthalmology;  Laterality: Right;   COLONOSCOPY N/A 05/27/2022   Procedure: COLONOSCOPY;  Surgeon: Jaynie Collins, DO;  Location: [facility] ENDOSCOPY;  Service: Gastroenterology;  Laterality: N/A;   CORONARY ANGIOPLASTY     CORONARY ARTERY BYPASS GRAFT N/A 09/30/2015   Procedure: CORONARY ARTERY BYPASS GRAFTING (CABG) TIMES TWO USING LEFT INTERNAL MAMMARY ARTERY AND ENDOSCOPICALLY HARVESTED RIGHT SAPHENOUS VEIN GRAFT;  Surgeon: Alleen Borne, MD;  Location: [facility] OR;  Service: Open Heart Surgery;  Laterality: N/A;   EYE SURGERY       Current Outpatient Medications  Medication Sig Dispense Refill   acetaminophen (TYLENOL) 325 MG tablet Take 2 tablets (650 mg total) by mouth every 6 (six) hours as needed for mild pain.     aspirin EC 81 MG tablet Take 1 tablet (81 mg total) by mouth daily. Swallow whole. 90 tablet 3   carvedilol (COREG) 3.125 MG  tablet Take 3.125 mg by mouth 2 (two) times daily with a meal.     diphenhydrAMINE HCl (BENADRYL ALLERGY PO) Take by mouth as needed.     lisinopril (ZESTRIL) 40 MG tablet Take 20 mg by mouth daily.     pantoprazole (PROTONIX) 40 MG tablet Take 1 tablet (40 mg total) by mouth daily. 30 tablet 0   simvastatin (ZOCOR) 20 MG tablet TAKE 1 TABLET EVERY DAY AT 6PM 90 tablet 3   nitroGLYCERIN (NITROSTAT) 0.4 MG SL tablet Place 1 tablet (0.4 mg total) under the tongue every 5 (five) minutes as needed for chest pain. (Patient not taking: Reported on 10/28/2022) 90 tablet 3   No current facility-administered medications for this visit.    Allergies:   Jardiance [empagliflozin], Penicillins, and Penicillin g    Social History:  The patient  reports that she quit smoking about 7 years ago. Her smoking use included cigarettes. She started smoking about 57 years ago. She has a 50 pack-year smoking history. She has never used smokeless tobacco. She reports that she does not currently use alcohol. She reports that she does not use drugs.   Family History:  The patient's family history includes Heart attack in her mother; Heart disease in her brother and mother; Leukemia in her father.    ROS:  Please see the history of present illness.   Otherwise, review of systems are positive for none.   All other systems are reviewed and negative.    PHYSICAL EXAM: VS:  BP (!) 140/60 (BP Location: Left Arm, Patient Position: Sitting, Cuff Size: Large)   Pulse 63   Ht 5' (1.524 m)   Wt 195 lb (88.5 kg)   SpO2 98%   BMI 38.08 kg/m  , BMI Body mass index is 38.08 kg/m. GEN: Well nourished, well developed, in no acute distress  HEENT: normal  Neck: no JVD or masses. Bilateral carotid bruits louder on the right side Cardiac: RRR; no rubs, or gallops . 2/ 6 systolic ejection murmur in the aortic area.  No significant edema.  Respiratory:  clear to auscultation bilaterally, normal work of breathing GI: soft,  nontender, nondistended, + BS MS: no deformity or atrophy  Skin: warm and dry, no rash Neuro:  Strength and sensation are intact Psych: euthymic mood, full affect Vascular: Distal pulses are diminished but palpable.  EKG:  EKG is ordered today. The ekg ordered today demonstrates : Normal sinus rhythm Nonspecific T wave abnormality When compared with ECG of 02-Oct-2015 07:56, No significant change was found      Recent Labs: No results found for requested labs within last 365 days.    Lipid Panel    Component Value Date/Time   CHOL 147 12/02/2015 0849   TRIG 130 12/02/2015 0849   HDL 51 12/02/2015 0849   CHOLHDL 2.9 12/02/2015 0849   VLDL 26 12/02/2015 0849   LDLCALC 70 12/02/2015 0849      Wt Readings from Last 3 Encounters:  10/28/22 195 lb (88.5 kg)  05/27/22 189 lb (85.7 kg)  09/16/21 192 lb  6 oz (87.3 kg)        ASSESSMENT AND PLAN:  1.  Coronary artery disease involving native coronary arteries without angina: She is doing very well with no anginal symptoms.  Continue medical therapy.  Continue aspirin 81 mg once daily.   2. Bilateral carotid disease: Most recent Doppler showed stable moderate left carotid stenosis.  I requested a follow-up carotid Doppler to be done in June of next year.  3. Hyperlipidemia: I reviewed most recent lipid profile done in February which showed an LDL of 58 which is at target.  Continue simvastatin.  4. Essential Hypertension: Blood pressure is reasonably controlled on small dose carvedilol and lisinopril.  5.  Peripheral arterial disease: The patient has evidence of moderate distal aortic disease extending into the common iliac arteries bilaterally.  No significant claudication and she does have palpable distal pulses.   Disposition:   FU with me in 12 months  Signed,  Lorine Bears, MD  10/28/2022 9:33 AM    [facility]

## 2022-11-20 IMAGING — MG MM DIGITAL SCREENING BILAT W/ TOMO AND CAD
8 series · 8 of 24 positions shown · non-contrast
Comparison: Previous exam(s).

CLINICAL DATA: Screening.

EXAM:
DIGITAL SCREENING BILATERAL MAMMOGRAM WITH TOMOSYNTHESIS AND CAD
TECHNIQUE: Bilateral screening digital craniocaudal and mediolateral oblique
mammograms were obtained. Bilateral screening digital breast
tomosynthesis was performed. The images were evaluated with
computer-aided detection.

[R CC synth-2D]
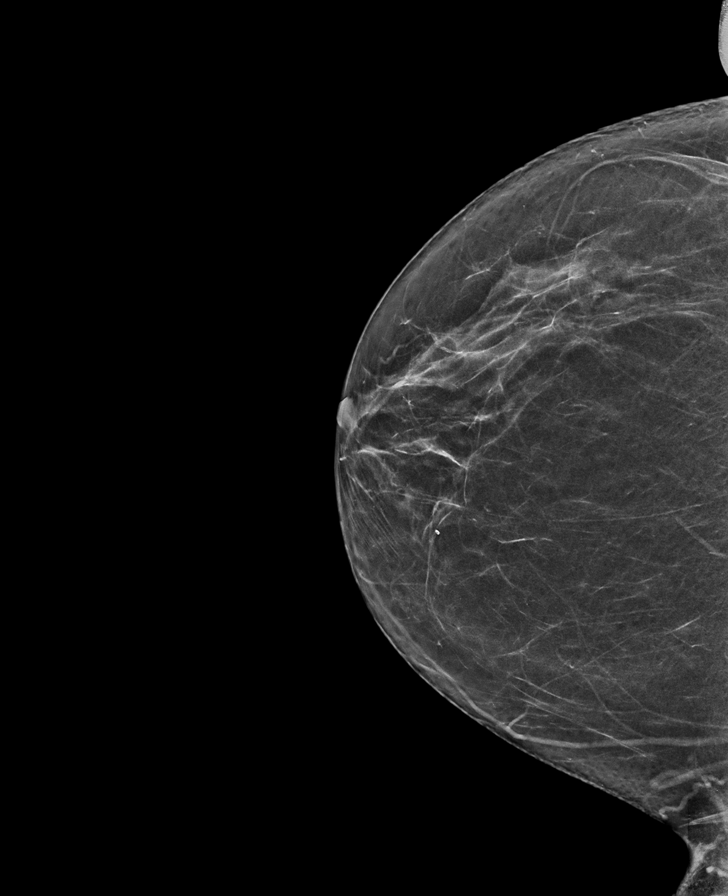

[L CC synth-2D]
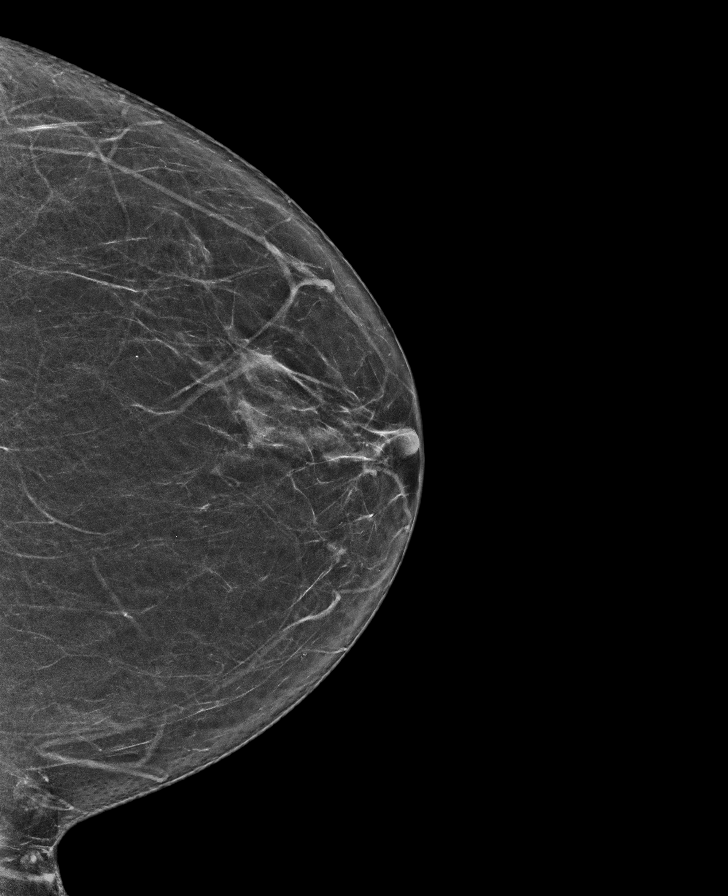

[R MLO synth-2D]
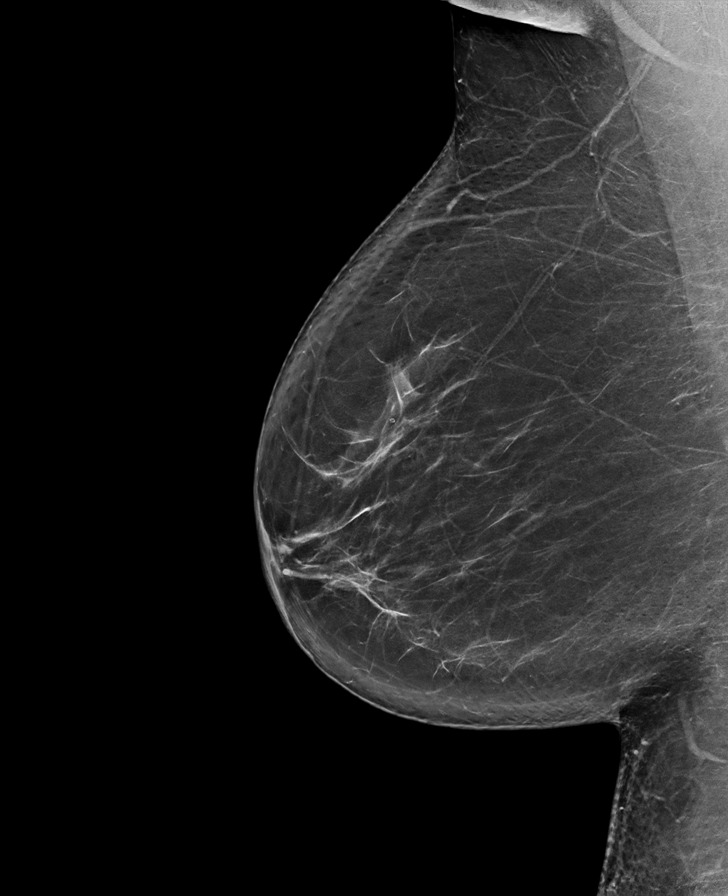

[L MLO synth-2D]
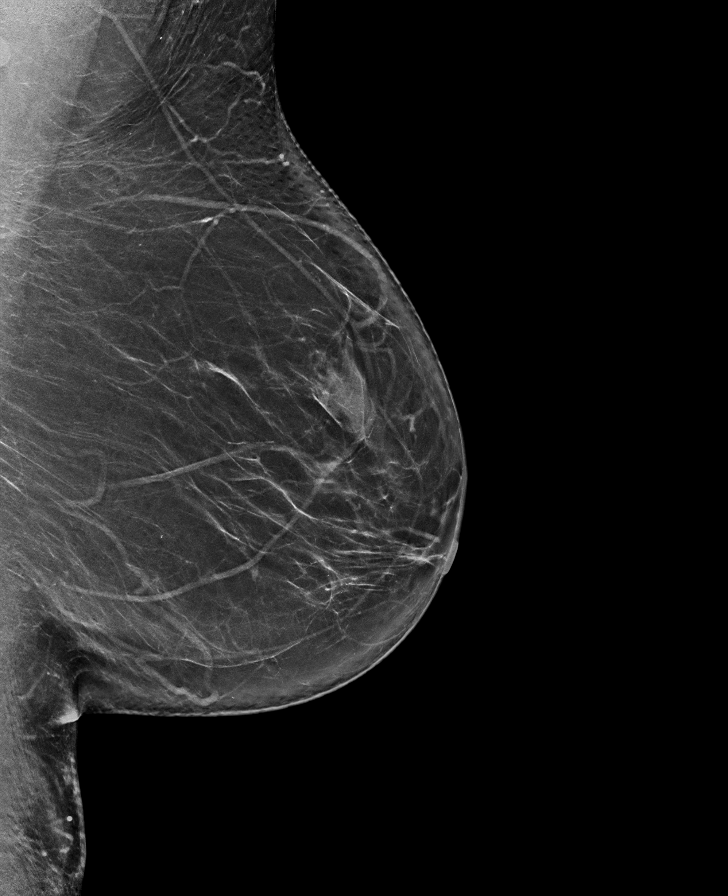

[R MLO tomo · tomo slice 45/88.0]
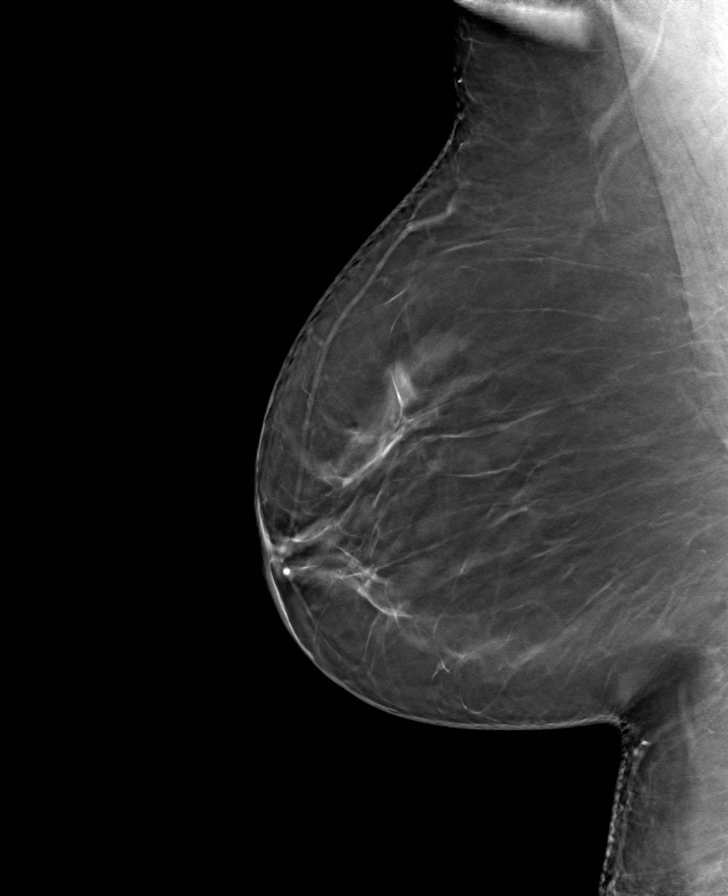

[L CC tomo · tomo slice 35/69.0]
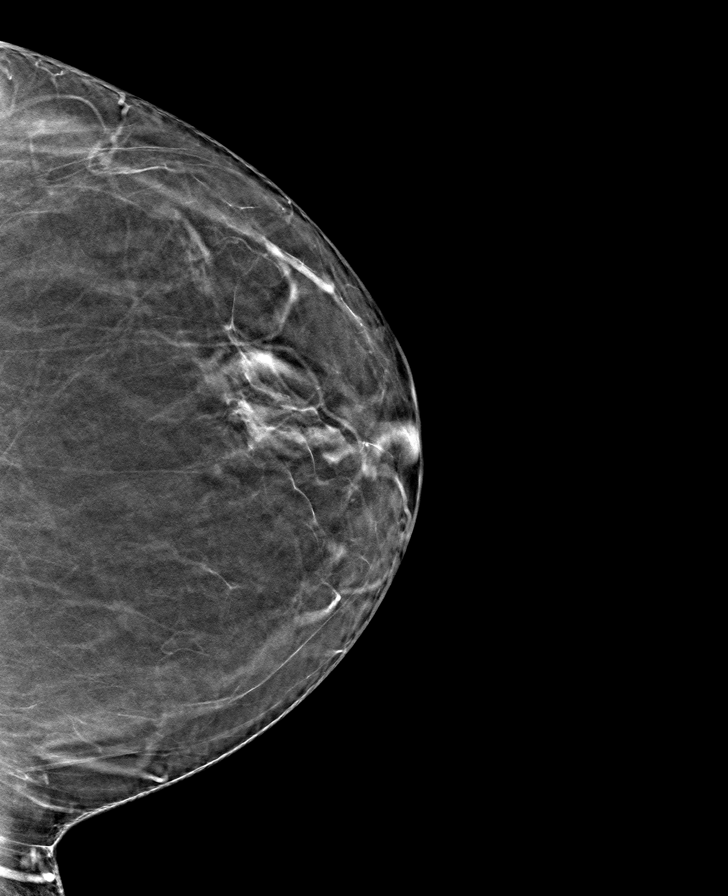

[L MLO tomo · tomo slice 43/86.0]
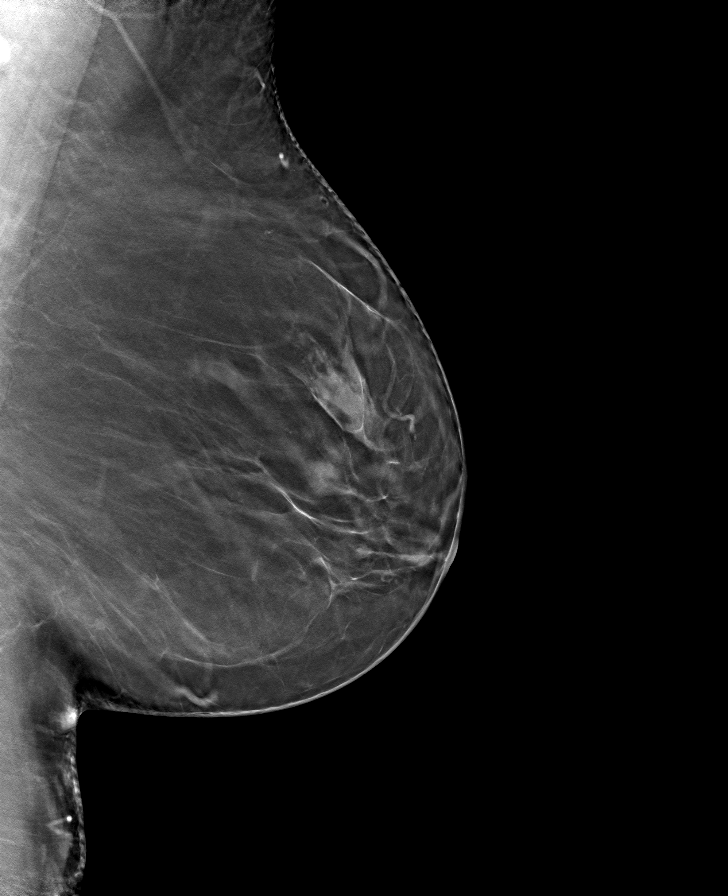

[R CC tomo · tomo slice 33/66.0]
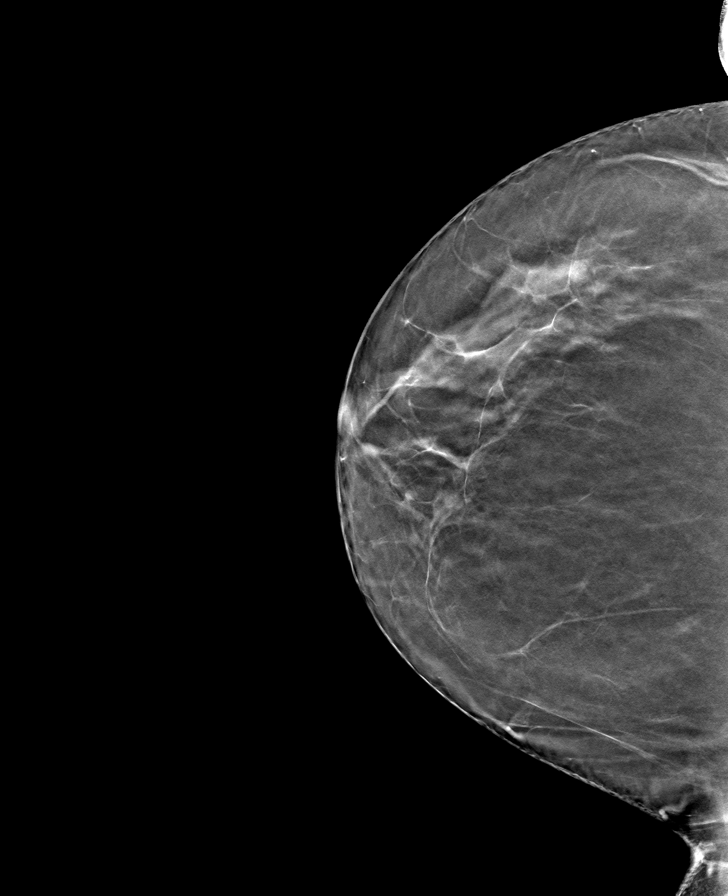

[8 of 24 positions shown; findings below may reference images not displayed]

ACR Breast Density Category b: There are scattered areas of
fibroglandular density.
FINDINGS: There are no findings suspicious for malignancy. The images were
evaluated with computer-aided detection.
IMPRESSION: No mammographic evidence of malignancy. A result letter of this
screening mammogram will be mailed directly to the patient.

RECOMMENDATION:
Screening mammogram in one year. (Code:[id number])

BI-RADS CATEGORY  1: Negative.

## 2022-12-12 ENCOUNTER — Other Ambulatory Visit: Payer: Self-pay | Admitting: Cardiovascular Disease

## 2023-01-04 DIAGNOSIS — H902 Conductive hearing loss, unspecified: Secondary | ICD-10-CM | POA: Diagnosis not present

## 2023-01-04 DIAGNOSIS — H6123 Impacted cerumen, bilateral: Secondary | ICD-10-CM | POA: Diagnosis not present

## 2023-01-04 DIAGNOSIS — Z0100 Encounter for examination of eyes and vision without abnormal findings: Secondary | ICD-10-CM | POA: Diagnosis not present

## 2023-06-22 DIAGNOSIS — I10 Essential (primary) hypertension: Secondary | ICD-10-CM | POA: Diagnosis not present

## 2023-06-22 DIAGNOSIS — N1832 Chronic kidney disease, stage 3b: Secondary | ICD-10-CM | POA: Diagnosis not present

## 2023-06-22 DIAGNOSIS — E118 Type 2 diabetes mellitus with unspecified complications: Secondary | ICD-10-CM | POA: Diagnosis not present

## 2023-06-22 DIAGNOSIS — E7800 Pure hypercholesterolemia, unspecified: Secondary | ICD-10-CM | POA: Diagnosis not present

## 2023-06-22 DIAGNOSIS — K219 Gastro-esophageal reflux disease without esophagitis: Secondary | ICD-10-CM | POA: Diagnosis not present

## 2023-06-29 DIAGNOSIS — I480 Paroxysmal atrial fibrillation: Secondary | ICD-10-CM | POA: Diagnosis not present

## 2023-06-29 DIAGNOSIS — E1122 Type 2 diabetes mellitus with diabetic chronic kidney disease: Secondary | ICD-10-CM | POA: Diagnosis not present

## 2023-06-29 DIAGNOSIS — N1831 Chronic kidney disease, stage 3a: Secondary | ICD-10-CM | POA: Diagnosis not present

## 2023-06-29 DIAGNOSIS — I129 Hypertensive chronic kidney disease with stage 1 through stage 4 chronic kidney disease, or unspecified chronic kidney disease: Secondary | ICD-10-CM | POA: Diagnosis not present

## 2023-06-29 DIAGNOSIS — I774 Celiac artery compression syndrome: Secondary | ICD-10-CM | POA: Diagnosis not present

## 2023-06-29 DIAGNOSIS — I2510 Atherosclerotic heart disease of native coronary artery without angina pectoris: Secondary | ICD-10-CM | POA: Diagnosis not present

## 2023-06-29 DIAGNOSIS — I6529 Occlusion and stenosis of unspecified carotid artery: Secondary | ICD-10-CM | POA: Diagnosis not present

## 2023-06-29 DIAGNOSIS — D631 Anemia in chronic kidney disease: Secondary | ICD-10-CM | POA: Diagnosis not present

## 2023-06-29 DIAGNOSIS — E782 Mixed hyperlipidemia: Secondary | ICD-10-CM | POA: Diagnosis not present

## 2023-07-05 DIAGNOSIS — E118 Type 2 diabetes mellitus with unspecified complications: Secondary | ICD-10-CM | POA: Diagnosis not present

## 2023-07-05 DIAGNOSIS — I10 Essential (primary) hypertension: Secondary | ICD-10-CM | POA: Diagnosis not present

## 2023-07-05 DIAGNOSIS — E7800 Pure hypercholesterolemia, unspecified: Secondary | ICD-10-CM | POA: Diagnosis not present

## 2023-07-07 DIAGNOSIS — M069 Rheumatoid arthritis, unspecified: Secondary | ICD-10-CM | POA: Diagnosis not present

## 2023-07-07 DIAGNOSIS — H353132 Nonexudative age-related macular degeneration, bilateral, intermediate dry stage: Secondary | ICD-10-CM | POA: Diagnosis not present

## 2023-09-22 DIAGNOSIS — E118 Type 2 diabetes mellitus with unspecified complications: Secondary | ICD-10-CM | POA: Diagnosis not present

## 2023-09-26 ENCOUNTER — Other Ambulatory Visit: Payer: Self-pay | Admitting: Cardiovascular Disease

## 2023-09-29 DIAGNOSIS — I129 Hypertensive chronic kidney disease with stage 1 through stage 4 chronic kidney disease, or unspecified chronic kidney disease: Secondary | ICD-10-CM | POA: Diagnosis not present

## 2023-09-29 DIAGNOSIS — M1990 Unspecified osteoarthritis, unspecified site: Secondary | ICD-10-CM | POA: Diagnosis not present

## 2023-09-29 DIAGNOSIS — E1122 Type 2 diabetes mellitus with diabetic chronic kidney disease: Secondary | ICD-10-CM | POA: Diagnosis not present

## 2023-09-29 DIAGNOSIS — N1831 Chronic kidney disease, stage 3a: Secondary | ICD-10-CM | POA: Diagnosis not present

## 2023-09-29 DIAGNOSIS — I480 Paroxysmal atrial fibrillation: Secondary | ICD-10-CM | POA: Diagnosis not present

## 2023-09-29 DIAGNOSIS — E785 Hyperlipidemia, unspecified: Secondary | ICD-10-CM | POA: Diagnosis not present

## 2023-10-12 ENCOUNTER — Other Ambulatory Visit: Payer: Self-pay | Admitting: Cardiovascular Disease

## 2023-10-12 DIAGNOSIS — I10 Essential (primary) hypertension: Secondary | ICD-10-CM

## 2023-10-12 DIAGNOSIS — I2510 Atherosclerotic heart disease of native coronary artery without angina pectoris: Secondary | ICD-10-CM

## 2023-10-12 DIAGNOSIS — E785 Hyperlipidemia, unspecified: Secondary | ICD-10-CM

## 2023-10-12 DIAGNOSIS — I739 Peripheral vascular disease, unspecified: Secondary | ICD-10-CM

## 2023-10-12 DIAGNOSIS — R0989 Other specified symptoms and signs involving the circulatory and respiratory systems: Secondary | ICD-10-CM

## 2023-10-13 DIAGNOSIS — I10 Essential (primary) hypertension: Secondary | ICD-10-CM | POA: Diagnosis not present

## 2023-10-26 ENCOUNTER — Ambulatory Visit: Payer: Self-pay | Attending: Cardiovascular Disease

## 2023-10-26 ENCOUNTER — Other Ambulatory Visit: Payer: Self-pay | Admitting: Nephrology

## 2023-10-26 ENCOUNTER — Encounter

## 2023-10-26 DIAGNOSIS — N184 Chronic kidney disease, stage 4 (severe): Secondary | ICD-10-CM | POA: Diagnosis not present

## 2023-10-26 DIAGNOSIS — D631 Anemia in chronic kidney disease: Secondary | ICD-10-CM | POA: Diagnosis not present

## 2023-10-26 DIAGNOSIS — E785 Hyperlipidemia, unspecified: Secondary | ICD-10-CM | POA: Diagnosis not present

## 2023-10-26 DIAGNOSIS — R809 Proteinuria, unspecified: Secondary | ICD-10-CM

## 2023-10-26 DIAGNOSIS — R0989 Other specified symptoms and signs involving the circulatory and respiratory systems: Secondary | ICD-10-CM

## 2023-10-26 DIAGNOSIS — K219 Gastro-esophageal reflux disease without esophagitis: Secondary | ICD-10-CM | POA: Diagnosis not present

## 2023-10-26 DIAGNOSIS — I10 Essential (primary) hypertension: Secondary | ICD-10-CM | POA: Diagnosis not present

## 2023-10-26 DIAGNOSIS — E1122 Type 2 diabetes mellitus with diabetic chronic kidney disease: Secondary | ICD-10-CM | POA: Diagnosis not present

## 2023-10-26 DIAGNOSIS — E669 Obesity, unspecified: Secondary | ICD-10-CM | POA: Diagnosis not present

## 2023-10-26 DIAGNOSIS — M1990 Unspecified osteoarthritis, unspecified site: Secondary | ICD-10-CM | POA: Diagnosis not present

## 2023-10-26 DIAGNOSIS — I2510 Atherosclerotic heart disease of native coronary artery without angina pectoris: Secondary | ICD-10-CM | POA: Diagnosis not present

## 2023-10-26 DIAGNOSIS — I4891 Unspecified atrial fibrillation: Secondary | ICD-10-CM | POA: Diagnosis not present

## 2023-10-28 ENCOUNTER — Ambulatory Visit
Admission: RE | Admit: 2023-10-28 | Discharge: 2023-10-28 | Disposition: A | Discharge status: Home / Self Care | Source: Ambulatory Visit | Attending: Nephrology

## 2023-10-28 DIAGNOSIS — N184 Chronic kidney disease, stage 4 (severe): Secondary | ICD-10-CM | POA: Diagnosis not present

## 2023-10-28 DIAGNOSIS — R809 Proteinuria, unspecified: Secondary | ICD-10-CM | POA: Diagnosis not present

## 2023-10-28 DIAGNOSIS — N189 Chronic kidney disease, unspecified: Secondary | ICD-10-CM | POA: Diagnosis not present

## 2023-10-31 ENCOUNTER — Ambulatory Visit: Payer: Self-pay | Admitting: Cardiovascular Disease

## 2023-10-31 DIAGNOSIS — R0989 Other specified symptoms and signs involving the circulatory and respiratory systems: Secondary | ICD-10-CM

## 2023-11-01 ENCOUNTER — Ambulatory Visit: Attending: Cardiovascular Disease | Admitting: Cardiovascular Disease

## 2023-11-01 ENCOUNTER — Encounter: Payer: Self-pay | Admitting: Cardiovascular Disease

## 2023-11-01 VITALS — BP 136/60 | HR 69 | Ht 59.0 in | Wt 184.2 lb

## 2023-11-01 DIAGNOSIS — I2510 Atherosclerotic heart disease of native coronary artery without angina pectoris: Secondary | ICD-10-CM

## 2023-11-01 DIAGNOSIS — I739 Peripheral vascular disease, unspecified: Secondary | ICD-10-CM

## 2023-11-01 DIAGNOSIS — I10 Essential (primary) hypertension: Secondary | ICD-10-CM | POA: Diagnosis not present

## 2023-11-01 DIAGNOSIS — R0989 Other specified symptoms and signs involving the circulatory and respiratory systems: Secondary | ICD-10-CM

## 2023-11-01 DIAGNOSIS — E785 Hyperlipidemia, unspecified: Secondary | ICD-10-CM

## 2023-11-01 MED ORDER — ATORVASTATIN CALCIUM 40 MG PO TABS: 40.0000 mg | ORAL_TABLET | Freq: Every day | ORAL | 3 refills | Status: DC

## 2023-11-01 NOTE — Patient Instructions (Signed)
 Medication Instructions:  STOP the Simvastatin   START Atorvastatin  40 mg once daily  *If you need a refill on your cardiac medications before your next appointment, please call your pharmacy*  Lab Work: None ordered If you have labs (blood work) drawn today and your tests are completely normal, you will receive your results only by: MyChart Message (if you have MyChart) OR A paper copy in the mail If you have any lab test that is abnormal or we need to change your treatment, we will call you to review the results.  Testing/Procedures: None ordered  Follow-Up: At [facility], you and your health needs are our priority.  As part of our continuing mission to provide you with exceptional heart care, our providers are all part of one team.  This team includes your primary Cardiologist (physician) and Advanced Practice Providers or APPs (Physician Assistants and Nurse Practitioners) who all work together to provide you with the care you need, when you need it.  Your next appointment:   12 month(s)  Provider:   You may see Deatrice Cage, MD or one of the following Advanced Practice Providers on your designated Care Team:   Lonni Meager, NP Lesley Maffucci, PA-C Bernardino Bring, PA-C Cadence Somerdale, PA-C Tylene Lunch, NP Barnie Hila, NP    We recommend signing up for the patient portal called MyChart.  Sign up information is provided on this After Visit Summary.  MyChart is used to connect with patients for Virtual Visits (Telemedicine).  Patients are able to view lab/test results, encounter notes, upcoming appointments, etc.  Non-urgent messages can be sent to your provider as well.   To learn more about what you can do with MyChart, go to [email or web address].

## 2023-11-01 NOTE — Progress Notes (Signed)
 [id number]    Cardiology Office Note   Date:  11/01/2023   ID:  [name], [name] [birth year], MRN [id number]  PCP:  Fernande Ophelia JINNY DOUGLAS, MD  Cardiologist:   Deatrice Cage, MD   Chief Complaint  Patient presents with   Follow-up    12 Month f/u no complaints today. Meds reviewed verbally with pt.      History of Present Illness: Kerri  R Hicks is a 78 y.o. female who presents for a follow-up visit regarding coronary artery disease status post CABG in June of 2017. She presented with anterior ST elevation myocardial infarction and was found to have significant two-vessel coronary artery disease including ostial LAD with normal ejection fraction. She underwent CABG 2 with LIMA to LAD and SVG to right PDA. She had postoperative atrial fibrillation. She is known to have moderate bilateral carotid disease, hyperlipidemia and previous tobacco use.  She is known to have peripheral arterial disease with borderline stenosis affecting the distal aorta into bilateral common iliac arteries.  This is being treated medically given that her symptoms are not lifestyle limiting. She also has known history of refractory hypertension with no evidence of renal artery stenosis on previous renal artery duplex in 2020.  She was hospitalized in May 2023 with colitis.   She has been doing well with no chest pain, shortness of breath or palpitations.  Recent labs showed slight worsening of renal function.  This is now managed by nephrology.  Past Medical History:  Diagnosis Date   Carotid arterial disease (HCC)    a. 10/2016 Carotid U/S: RICA 1-39, LICA 40-59, bilat ECAs >50; b. 06/2019 U/S: bilat ICA 40-59%, bilat ECA >50%-->f/u 1 yr.   Celiac artery stenosis (HCC)    a. 10/2018 Abd duplex: 70-99% celiac artery stenosis.   Controlled type 2 diabetes mellitus with complication, without long-term current use of insulin  (HCC)    Coronary artery disease    a. 09/2015 Anterior STEMI w/ ostial LAD disease-->CABG  x 2 (LIMA to LAD and SVG to RPDA).   Diabetes mellitus without complication (HCC)    History of echocardiogram    a. 09/2015 TEE (intraop): EF 50-55%.   Hyperlipidemia    Hypertension    a. 10/2018 Renal artery duplex: 1-59% bilat RA stenosis.   Tobacco abuse 09/30/2015    Past Surgical History:  Procedure Laterality Date   ANKLE SURGERY Left    CARDIAC CATHETERIZATION N/A 09/30/2015   Procedure: Left Heart Cath and Coronary Angiography;  Surgeon: Alm LELON Clay, MD;  Location: [facility] INVASIVE CV LAB;  Service: Cardiovascular;  Laterality: N/A;   CATARACT EXTRACTION W/PHACO Left 02/20/2020   Procedure: CATARACT EXTRACTION PHACO AND INTRAOCULAR LENS PLACEMENT (IOC) LEFT 10.47 01:32.2 11.3%;  Surgeon: Mittie Gaskin, MD;  Location: [facility]ERY [facility];  Service: Ophthalmology;  Laterality: Left;   CATARACT EXTRACTION W/PHACO Right 03/12/2020   Procedure: CATARACT EXTRACTION PHACO AND INTRAOCULAR LENS PLACEMENT (IOC) RIGHT 9.52 01:26.2 11.1%;  Surgeon: Mittie Gaskin, MD;  Location: [facility] SURGERY CNTR;  Service: Ophthalmology;  Laterality: Right;   COLONOSCOPY N/A 05/27/2022   Procedure: COLONOSCOPY;  Surgeon: Onita Elspeth Sharper, DO;  Location: [facility] ENDOSCOPY;  Service: Gastroenterology;  Laterality: N/A;   CORONARY ANGIOPLASTY     CORONARY ARTERY BYPASS GRAFT N/A 09/30/2015   Procedure: CORONARY ARTERY BYPASS GRAFTING (CABG) TIMES TWO USING LEFT INTERNAL MAMMARY ARTERY AND ENDOSCOPICALLY HARVESTED RIGHT SAPHENOUS VEIN GRAFT;  Surgeon: Dorise MARLA Fellers, MD;  Location: [facility] OR;  Service: Open Heart Surgery;  Laterality: N/A;  EYE SURGERY       Current Outpatient Medications  Medication Sig Dispense Refill   acetaminophen  (TYLENOL ) 325 MG tablet Take 2 tablets (650 mg total) by mouth every 6 (six) hours as needed for mild pain.     aspirin  EC 81 MG tablet Take 1 tablet (81 mg total) by mouth daily. Swallow whole. 90 tablet 3   carvedilol  (COREG ) 3.125 MG tablet Take 3.125 mg by mouth  2 (two) times daily with a meal.     diphenhydrAMINE HCl (BENADRYL ALLERGY PO) Take by mouth as needed.     lisinopril  (ZESTRIL ) 40 MG tablet Take 20 mg by mouth daily.     nitroGLYCERIN  (NITROSTAT ) 0.4 MG SL tablet Place 1 tablet (0.4 mg total) under the tongue every 5 (five) minutes as needed for chest pain. 90 tablet 3   pantoprazole  (PROTONIX ) 40 MG tablet Take 1 tablet (40 mg total) by mouth daily. 30 tablet 0   Semaglutide,0.25 or 0.5MG /DOS, 2 MG/3ML SOPN Inject 0.25 mg into the skin once a week.     simvastatin  (ZOCOR ) 20 MG tablet TAKE 1 TABLET EVERY DAY AT 6PM 90 tablet 0   No current facility-administered medications for this visit.    Allergies:   Jardiance [empagliflozin], Penicillins, and Penicillin g    Social History:  The patient  reports that she quit smoking about 8 years ago. Her smoking use included cigarettes. She started smoking about 58 years ago. She has a 50 pack-year smoking history. She has never used smokeless tobacco. She reports that she does not currently use alcohol . She reports that she does not use drugs.   Family History:  The patient's family history includes Heart attack in her mother; Heart disease in her brother and mother; Leukemia in her father.    ROS:  Please see the history of present illness.   Otherwise, review of systems are positive for none.   All other systems are reviewed and negative.    PHYSICAL EXAM: VS:  BP 136/60 (BP Location: Left Arm, Patient Position: Sitting, Cuff Size: Large)   Pulse 69   Ht 4' 11 (1.499 m)   Wt 184 lb 4 oz (83.6 kg)   SpO2 98%   BMI 37.21 kg/m  , BMI Body mass index is 37.21 kg/m. GEN: Well nourished, well developed, in no acute distress  HEENT: normal  Neck: no JVD or masses. Bilateral carotid bruits  Cardiac: RRR; no rubs, or gallops . 2/ 6 systolic ejection murmur in the aortic area.  No significant edema.  Respiratory:  clear to auscultation bilaterally, normal work of breathing GI: soft, nontender,  nondistended, + BS MS: no deformity or atrophy  Skin: warm and dry, no rash Neuro:  Strength and sensation are intact Psych: euthymic mood, full affect Vascular: Distal pulses are diminished but palpable.  EKG:  EKG is ordered today. The ekg ordered today demonstrates : Normal sinus rhythm Cannot rule out Anterior infarct , age undetermined When compared with ECG of 28-Oct-2022 09:18, No significant change was found     Recent Labs: No results found for requested labs within last 365 days.    Lipid Panel    Component Value Date/Time   CHOL 147 12/02/2015 0849   TRIG 130 12/02/2015 0849   HDL 51 12/02/2015 0849   CHOLHDL 2.9 12/02/2015 0849   VLDL 26 12/02/2015 0849   LDLCALC 70 12/02/2015 0849      Wt Readings from Last 3 Encounters:  11/01/23 184 lb 4 oz (  83.6 kg)  10/28/22 195 lb (88.5 kg)  05/27/22 189 lb (85.7 kg)        ASSESSMENT AND PLAN:  1.  Coronary artery disease involving native coronary arteries without angina: She is doing very well with no anginal symptoms.  Continue medical therapy.  Continue aspirin  81 mg once daily.   2. Bilateral carotid disease: I reviewed the results of recent carotid Doppler with her which showed stable moderate left carotid stenosis.  Repeat study in 1 year.  3. Hyperlipidemia: I reviewed most recent labs which showed an LDL of 64.  Given her extensive cardiovascular disease, recommend a target LDL of less than 55.  Thus, I elected to switch simvastatin  to atorvastatin  40 mg once daily.  4. Essential Hypertension: Blood pressure is reasonably controlled on small dose carvedilol  and lisinopril .  5.  Peripheral arterial disease: The patient has evidence of moderate distal aortic disease extending into the common iliac arteries bilaterally.  No significant claudication and she does have palpable distal pulses.   Disposition:   FU with me in 12 months  Signed,  Deatrice Cage, MD  11/01/2023 1:34 PM    [facility]

## 2023-11-28 ENCOUNTER — Other Ambulatory Visit: Payer: Self-pay

## 2023-11-28 MED ORDER — ATORVASTATIN CALCIUM 40 MG PO TABS: 40.0000 mg | ORAL_TABLET | Freq: Every day | ORAL | 3 refills | Status: AC

## 2023-12-28 DIAGNOSIS — N184 Chronic kidney disease, stage 4 (severe): Secondary | ICD-10-CM | POA: Diagnosis not present

## 2023-12-28 DIAGNOSIS — E669 Obesity, unspecified: Secondary | ICD-10-CM | POA: Diagnosis not present

## 2023-12-28 DIAGNOSIS — R809 Proteinuria, unspecified: Secondary | ICD-10-CM | POA: Diagnosis not present

## 2023-12-28 DIAGNOSIS — E1122 Type 2 diabetes mellitus with diabetic chronic kidney disease: Secondary | ICD-10-CM | POA: Diagnosis not present

## 2023-12-28 DIAGNOSIS — D631 Anemia in chronic kidney disease: Secondary | ICD-10-CM | POA: Diagnosis not present

## 2023-12-28 DIAGNOSIS — I10 Essential (primary) hypertension: Secondary | ICD-10-CM | POA: Diagnosis not present

## 2023-12-28 DIAGNOSIS — I2510 Atherosclerotic heart disease of native coronary artery without angina pectoris: Secondary | ICD-10-CM | POA: Diagnosis not present

## 2023-12-28 DIAGNOSIS — I4891 Unspecified atrial fibrillation: Secondary | ICD-10-CM | POA: Diagnosis not present

## 2023-12-28 DIAGNOSIS — M1990 Unspecified osteoarthritis, unspecified site: Secondary | ICD-10-CM | POA: Diagnosis not present

## 2023-12-28 DIAGNOSIS — K219 Gastro-esophageal reflux disease without esophagitis: Secondary | ICD-10-CM | POA: Diagnosis not present

## 2023-12-28 DIAGNOSIS — E785 Hyperlipidemia, unspecified: Secondary | ICD-10-CM | POA: Diagnosis not present

## 2024-01-19 DIAGNOSIS — H524 Presbyopia: Secondary | ICD-10-CM | POA: Diagnosis not present

## 2024-01-19 DIAGNOSIS — E119 Type 2 diabetes mellitus without complications: Secondary | ICD-10-CM | POA: Diagnosis not present

## 2024-01-23 DIAGNOSIS — D649 Anemia, unspecified: Secondary | ICD-10-CM | POA: Diagnosis not present

## 2024-01-23 DIAGNOSIS — N1832 Chronic kidney disease, stage 3b: Secondary | ICD-10-CM | POA: Diagnosis not present

## 2024-01-23 DIAGNOSIS — E7800 Pure hypercholesterolemia, unspecified: Secondary | ICD-10-CM | POA: Diagnosis not present

## 2024-01-23 DIAGNOSIS — E118 Type 2 diabetes mellitus with unspecified complications: Secondary | ICD-10-CM | POA: Diagnosis not present

## 2024-01-30 DIAGNOSIS — E1122 Type 2 diabetes mellitus with diabetic chronic kidney disease: Secondary | ICD-10-CM | POA: Diagnosis not present

## 2024-01-30 DIAGNOSIS — E7800 Pure hypercholesterolemia, unspecified: Secondary | ICD-10-CM | POA: Diagnosis not present

## 2024-01-30 DIAGNOSIS — Z1331 Encounter for screening for depression: Secondary | ICD-10-CM | POA: Diagnosis not present

## 2024-01-30 DIAGNOSIS — I2510 Atherosclerotic heart disease of native coronary artery without angina pectoris: Secondary | ICD-10-CM | POA: Diagnosis not present

## 2024-01-30 DIAGNOSIS — Z23 Encounter for immunization: Secondary | ICD-10-CM | POA: Diagnosis not present

## 2024-01-30 DIAGNOSIS — Z0000 Encounter for general adult medical examination without abnormal findings: Secondary | ICD-10-CM | POA: Diagnosis not present

## 2024-01-30 DIAGNOSIS — I480 Paroxysmal atrial fibrillation: Secondary | ICD-10-CM | POA: Diagnosis not present

## 2024-01-30 DIAGNOSIS — I129 Hypertensive chronic kidney disease with stage 1 through stage 4 chronic kidney disease, or unspecified chronic kidney disease: Secondary | ICD-10-CM | POA: Diagnosis not present

## 2024-03-22 ENCOUNTER — Telehealth (HOSPITAL_BASED_OUTPATIENT_CLINIC_OR_DEPARTMENT_OTHER): Payer: Self-pay | Admitting: *Deleted

## 2024-03-22 ENCOUNTER — Telehealth (HOSPITAL_BASED_OUTPATIENT_CLINIC_OR_DEPARTMENT_OTHER): Payer: Self-pay

## 2024-03-22 NOTE — Telephone Encounter (Signed)
 See below did not send to you correctly.

## 2024-03-22 NOTE — Telephone Encounter (Signed)
 Pt has been scheduled tele preop appt 03/27/24. Pt tells me that her procedure is 04/04/24. I stated the pt that the DDS office sent request stating TBD, so I thanked her for letting our office know of the procedure date.   Med rec and consent are done.      Patient Consent for Virtual Visit        Kerri Hicks has provided verbal consent on 03/22/2024 for a virtual visit (video or telephone).   CONSENT FOR VIRTUAL VISIT FOR:  Kerri Hicks  By participating in this virtual visit I agree to the following:  I hereby voluntarily request, consent and authorize [facility] and its employed or contracted physicians, physician assistants, nurse practitioners or other licensed health care professionals (the Practitioner), to provide me with telemedicine health care services (the "Services) as deemed necessary by the treating Practitioner. I acknowledge and consent to receive the Services by the Practitioner via telemedicine. I understand that the telemedicine visit will involve communicating with the Practitioner through live audiovisual communication technology and the disclosure of certain medical information by electronic transmission. I acknowledge that I have been given the opportunity to request an in-person assessment or other available alternative prior to the telemedicine visit and am voluntarily participating in the telemedicine visit.  I understand that I have the right to withhold or withdraw my consent to the use of telemedicine in the course of my care at any time, without affecting my right to future care or treatment, and that the Practitioner or I may terminate the telemedicine visit at any time. I understand that I have the right to inspect all information obtained and/or recorded in the course of the telemedicine visit and may receive copies of available information for a reasonable fee.  I understand that some of the potential risks of receiving the Services via  telemedicine include:  Delay or interruption in medical evaluation due to technological equipment failure or disruption; Information transmitted may not be sufficient (e.g. poor resolution of images) to allow for appropriate medical decision making by the Practitioner; and/or  In rare instances, security protocols could fail, causing a breach of personal health information.  Furthermore, I acknowledge that it is my responsibility to provide information about my medical history, conditions and care that is complete and accurate to the best of my ability. I acknowledge that Practitioner's advice, recommendations, and/or decision may be based on factors not within their control, such as incomplete or inaccurate data provided by me or distortions of diagnostic images or specimens that may result from electronic transmissions. I understand that the practice of medicine is not an exact science and that Practitioner makes no warranties or guarantees regarding treatment outcomes. I acknowledge that a copy of this consent can be made available to me via my patient portal [facility] MyChart), or I can request a printed copy by calling the office of [facility].    I understand that my insurance will be billed for this visit.   I have read or had this consent read to me. I understand the contents of this consent, which adequately explains the benefits and risks of the Services being provided via telemedicine.  I have been provided ample opportunity to ask questions regarding this consent and the Services and have had my questions answered to my satisfaction. I give my informed consent for the services to be provided through the use of telemedicine in my medical care

## 2024-03-22 NOTE — Telephone Encounter (Signed)
   Pre-operative Risk Assessment    Patient Name: Kerri  ALLIA Hicks  DOB: 1945-10-19 MRN: [id number]    Date of last office visit: 11/01/2023 with Dr. Darron Date of next office visit: None  Request for Surgical Clearance    Procedure:  Dental Extraction - Amount of Teeth to be Pulled:  13 surgical extractions and denture fabrication  Date of Surgery:  Clearance TBD                                 Surgeon:  Juliene Shelter DDS Surgeon's Group or Practice Name:  [facility] Phone number:  [phone] Fax number:  [phone]   Type of Clearance Requested:   - Medical  - Pharmacy:  Hold Aspirin  -Does not specify   Type of Anesthesia:  Lidocaine  and septocaine   Additional requests/questions:  None  SignedPatrcia Iverson CROME   03/22/2024, 10:37 AM

## 2024-03-22 NOTE — Telephone Encounter (Signed)
   Name: Kerri Hicks  DOB: 12/17/1945  MRN: [id number]  Primary Cardiologist: Deatrice Cage, MD   Preoperative team, please contact this patient and set up a phone call appointment for further preoperative risk assessment. Please obtain consent and complete medication review. Thank you for your help.Last seen by Dr. Cage on 11/01/2023.   I confirm that guidance regarding antiplatelet and oral anticoagulation therapy has been completed and, if necessary, noted below.  Per office protocol, if patient is without any new symptoms or concerns at the time of their virtual visit, she may hold ASA for 7 days prior to procedure. Please resume ASA as soon as possible postprocedure, at the discretion of the surgeon.    I also confirmed the patient resides in the state of [place] . As per [facility] Medical Board telemedicine laws, the patient must reside in the state in which the provider is licensed.   Lamarr Satterfield, NP 03/22/2024, 10:44 AM [facility]

## 2024-03-22 NOTE — Telephone Encounter (Signed)
 Pt has been scheduled tele preop appt 03/27/24. Pt tells me that her procedure is 04/04/24. I stated the pt that the DDS office sent request stating TBD, so I thanked her for letting our office know of the procedure date.   Med rec and consent are done.

## 2024-03-27 ENCOUNTER — Ambulatory Visit: Attending: Cardiology

## 2024-03-27 DIAGNOSIS — Z01810 Encounter for preprocedural cardiovascular examination: Secondary | ICD-10-CM | POA: Diagnosis not present

## 2024-03-27 NOTE — Progress Notes (Signed)
Virtual Visit via Telephone Note   Because of Kynzley  R Matus co-morbid illnesses, she is at least at moderate risk for complications without adequate follow up.  This format is felt to be most appropriate for this patient at this time.  Due to technical limitations with video connection (technology), today's appointment will be conducted as an audio only telehealth visit, and Kerri Hicks verbally agreed to proceed in this manner.   All issues noted in this document were discussed and addressed.  No physical exam could be performed with this format.  Evaluation Performed:  Preoperative cardiovascular risk assessment _____________   Date:  03/27/2024   Patient ID:  Kerri Hicks, DOB Apr 22, 1945, MRN [id number] Patient Location:  Home Provider location:   Office  Primary Care Provider:  Fernande Ophelia JINNY DOUGLAS, MD Primary Cardiologist:  Deatrice Cage, MD  Chief Complaint / Patient Profile   78 y.o. y/o female with a h/o CAD s/p CABG x 19 September 2015, s/p STEMI, former tobacco abuse, carotid artery disease, HLD, HTN, PAD who is pending surgical extraction of 13 teeth and denture fabrication and presents today for telephonic preoperative cardiovascular risk assessment.  History of Present Illness    Kerri Hicks is a 78 y.o. female who presents via audio/video conferencing for a telehealth visit today.  Pt was last seen in cardiology clinic on 11/01/23 by Dr. Cage.  At that time Kerri Hicks was doing well.  The patient is now pending procedure as outlined above. Since her last visit, she  denies chest pain, shortness of breath, lower extremity edema, fatigue, palpitations, melena, hematuria, hemoptysis, diaphoresis, weakness, presyncope, syncope, orthopnea, and PND. She is active around home with house keeping and weed eating/gardening and is able to achieve > 4 METS activity without concerning cardiac symptoms.   Past Medical History    Past Medical History:  Diagnosis Date    Carotid arterial disease    a. 10/2016 Carotid U/S: RICA 1-39, LICA 40-59, bilat ECAs >50; b. 06/2019 U/S: bilat ICA 40-59%, bilat ECA >50%-->f/u 1 yr.   Celiac artery stenosis    a. 10/2018 Abd duplex: 70-99% celiac artery stenosis.   Controlled type 2 diabetes mellitus with complication, without long-term current use of insulin  (HCC)    Coronary artery disease    a. 09/2015 Anterior STEMI w/ ostial LAD disease-->CABG x 2 (LIMA to LAD and SVG to RPDA).   Diabetes mellitus without complication (HCC)    History of echocardiogram    a. 09/2015 TEE (intraop): EF 50-55%.   Hyperlipidemia    Hypertension    a. 10/2018 Renal artery duplex: 1-59% bilat RA stenosis.   Tobacco abuse 09/30/2015   Past Surgical History:  Procedure Laterality Date   ANKLE SURGERY Left    CARDIAC CATHETERIZATION N/A 09/30/2015   Procedure: Left Heart Cath and Coronary Angiography;  Surgeon: Alm LELON Clay, MD;  [facility]: [facility] INVASIVE CV LAB;  Service: Cardiovascular;  Laterality: N/A;   CATARACT EXTRACTION W/PHACO Left 02/20/2020   Procedure: CATARACT EXTRACTION PHACO AND INTRAOCULAR LENS PLACEMENT (IOC) LEFT 10.47 01:32.2 11.3%;  Surgeon: Mittie Gaskin, MD;  Location: [facility];  Service: Ophthalmology;  Laterality: Left;   CATARACT EXTRACTION W/PHACO Right 03/12/2020   Procedure: CATARACT EXTRACTION PHACO AND INTRAOCULAR LENS PLACEMENT (IOC) RIGHT 9.52 01:26.2 11.1%;  Surgeon: Mittie Gaskin, MD;  Location: [facility];  Service: Ophthalmology;  Laterality: Right;   COLONOSCOPY N/A 05/27/2022   Procedure: COLONOSCOPY;  Surgeon: Onita Elspeth Sharper, DO;  Location:  [facility] ENDOSCOPY;  Service: Gastroenterology;  Laterality: N/A;   CORONARY ANGIOPLASTY     CORONARY ARTERY BYPASS GRAFT N/A 09/30/2015   Procedure: CORONARY ARTERY BYPASS GRAFTING (CABG) TIMES TWO USING LEFT INTERNAL MAMMARY ARTERY AND ENDOSCOPICALLY HARVESTED RIGHT SAPHENOUS VEIN GRAFT;  Surgeon: Dorise MARLA Fellers, MD;  Location: [facility] OR;   Service: Open Heart Surgery;  Laterality: N/A;   EYE SURGERY      Allergies  Allergies  Allergen Reactions   Jardiance [Empagliflozin] Diarrhea and Nausea And Vomiting    Severe GI symptoms   Penicillins Rash   Penicillin G Rash    Home Medications    Prior to Admission medications   Medication Sig Start Date End Date Taking? Authorizing Provider  acetaminophen  (TYLENOL ) 325 MG tablet Take 2 tablets (650 mg total) by mouth every 6 (six) hours as needed for mild pain. 10/05/15   Barrett, Rocky SAUNDERS, PA-C  aspirin  EC 81 MG tablet Take 1 tablet (81 mg total) by mouth daily. Swallow whole. 09/16/21   Darron Deatrice LABOR, MD  atorvastatin  (LIPITOR ) 40 MG tablet Take 1 tablet (40 mg total) by mouth daily. 11/28/23   Darron Deatrice LABOR, MD  carvedilol  (COREG ) 3.125 MG tablet Take 3.125 mg by mouth 2 (two) times daily with a meal.    [provider]  diphenhydrAMINE HCl (BENADRYL ALLERGY PO) Take by mouth as needed.    [provider]  lisinopril  (ZESTRIL ) 40 MG tablet Take 20 mg by mouth daily.    [provider]  nitroGLYCERIN  (NITROSTAT ) 0.4 MG SL tablet Place 1 tablet (0.4 mg total) under the tongue every 5 (five) minutes as needed for chest pain. 11/02/19 11/01/23  Vivienne Lonni Ingle, NP  pantoprazole  (PROTONIX ) 40 MG tablet Take 1 tablet (40 mg total) by mouth daily. 06/18/16   Darron Deatrice LABOR, MD  Semaglutide,0.25 or 0.5MG /DOS, 2 MG/3ML SOPN Inject 0.25 mg into the skin once a week. 07/05/23   [provider]    Physical Exam    Vital Signs:  Kerri Hicks does not have vital signs available for review today.  Given telephonic nature of communication, physical exam is limited. AAOx3. NAD. Normal affect.  Speech and respirations are unlabored.  Accessory Clinical Findings    None  Assessment & Plan    1.  Preoperative Cardiovascular Risk Assessment: According to the Revised Cardiac Risk Index (RCRI), her Perioperative Risk of Major Cardiac  Event is (%): 0.9. Her Functional Capacity in METs is: 5.62 according to the Duke Activity Status Index (DASI). The patient is doing well from a cardiac perspective. Therefore, based on ACC/AHA guidelines, the patient would be at acceptable risk for the planned procedure without further cardiovascular testing.   The patient was advised that if she develops new symptoms prior to surgery to contact our office to arrange for a follow-up visit, and she verbalized understanding.  Ideally aspirin  should be continued without interruption, however if the bleeding risk is too great, aspirin  may be held for 5-7 days prior to surgery. Please resume aspirin  post operatively when it is felt to be safe from a bleeding standpoint.  SBE prophylaxis is not indicated.    A copy of this note will be routed to requesting surgeon.  Time:   Today, I have spent 10 minutes with the patient with telehealth technology discussing medical history, symptoms, and management plan.     Kerri EMERSON Bane, NP-C  03/27/2024, 11:21 AM [address], [address] [place], [name] [address] Office [phone] Fax [phone]  [phone]   

## 2024-04-03 NOTE — Telephone Encounter (Signed)
 I s/w [facility] office and d/w the conversation I had with the pt. I stated the pt can hold her ASA (aspirin ) x 5-7 days prior, nor does she need an ABX/SBE (antibiotic). Dental office asked when can she hold her ASA. I said she should have held when NP advised but at this time that will be upto their office as to when they will do the procedure. Dental office staff said she will call the pt now.

## 2024-04-03 NOTE — Telephone Encounter (Signed)
 Patient is calling with questions about clearance. Please advise

## 2024-04-03 NOTE — Telephone Encounter (Signed)
 I s/w the pt. I reviewed the notes from Rosaline Bane, NP with the pt and the recommendations to hold ASA 5-7 days prior to dental procedure. Pt said did not hear the NP tell her about the ASA hold. I assured the pt that it is noted in her notes that she reviewed this with her. Pt said the DDS office told that we said to hold all of her medications x 7 days. I stated that is incorrect that we never gave that recommendation. The only recommendation for medication to hold is the ASA.  She told me today the DDS office told her to just not take her ASA tomorrow for the procedure , but the tells me that the DDS office said she cannot have her procedure tomorrow. I said that is up to the doctor if they feel they cannot do it tomorrow, she will need to d/w DDS further about the appt.   I did tell her that I will call DDS and make they are aware we cleared her and ok to hold ASA 5-7 days, though pt has not done yet.
